# Patient Record
Sex: Female | Born: 1965
Health system: Southern US, Community
[De-identification: ages and names within clinical notes are randomized; demographics above are authoritative.]

## PROBLEM LIST (undated history)

## (undated) DIAGNOSIS — R011 Cardiac murmur, unspecified: Secondary | ICD-10-CM

## (undated) DIAGNOSIS — N1330 Unspecified hydronephrosis: Secondary | ICD-10-CM

## (undated) DIAGNOSIS — K219 Gastro-esophageal reflux disease without esophagitis: Secondary | ICD-10-CM

## (undated) DIAGNOSIS — K436 Other and unspecified ventral hernia with obstruction, without gangrene: Secondary | ICD-10-CM

## (undated) DIAGNOSIS — K589 Irritable bowel syndrome without diarrhea: Secondary | ICD-10-CM

## (undated) DIAGNOSIS — I10 Essential (primary) hypertension: Secondary | ICD-10-CM

## (undated) HISTORY — PX: HAND SURGERY: SHX662

## (undated) HISTORY — PX: PYELOPLASTY: SUR1061

## (undated) HISTORY — DX: Unspecified hydronephrosis: N13.30

## (undated) HISTORY — DX: Gastro-esophageal reflux disease without esophagitis: K21.9

## (undated) HISTORY — DX: Cardiac murmur, unspecified: R01.1

## (undated) HISTORY — DX: Irritable bowel syndrome, unspecified: K58.9

## (undated) HISTORY — DX: Essential (primary) hypertension: I10

## (undated) HISTORY — PX: OTHER SURGICAL HISTORY: SHX169

## (undated) HISTORY — PX: TONSILLECTOMY: SUR1361

---
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
## 1998-03-03 ENCOUNTER — Ambulatory Visit (HOSPITAL_COMMUNITY): Admission: RE | Admit: 1998-03-03 | Discharge: 1998-03-03 | Payer: Self-pay | Admitting: Obstetrics and Gynecology

## 1998-03-16 ENCOUNTER — Inpatient Hospital Stay (HOSPITAL_COMMUNITY): Admission: AD | Admit: 1998-03-16 | Discharge: 1998-03-16 | Payer: Self-pay | Admitting: *Deleted

## 1998-05-04 ENCOUNTER — Inpatient Hospital Stay (HOSPITAL_COMMUNITY): Admission: AD | Admit: 1998-05-04 | Discharge: 1998-05-04 | Payer: Self-pay | Admitting: Obstetrics and Gynecology

## 1998-05-15 ENCOUNTER — Inpatient Hospital Stay (HOSPITAL_COMMUNITY): Admission: AD | Admit: 1998-05-15 | Discharge: 1998-05-17 | Payer: Self-pay | Admitting: Obstetrics and Gynecology

## 1999-03-08 ENCOUNTER — Emergency Department (HOSPITAL_COMMUNITY): Admission: EM | Admit: 1999-03-08 | Discharge: 1999-03-08 | Payer: Self-pay | Admitting: Internal Medicine

## 1999-03-11 ENCOUNTER — Ambulatory Visit (HOSPITAL_COMMUNITY): Admission: RE | Admit: 1999-03-11 | Discharge: 1999-03-11 | Payer: Self-pay | Admitting: Internal Medicine

## 1999-08-02 ENCOUNTER — Other Ambulatory Visit: Admission: RE | Admit: 1999-08-02 | Discharge: 1999-08-02 | Payer: Self-pay | Admitting: Obstetrics and Gynecology

## 2001-01-17 ENCOUNTER — Other Ambulatory Visit: Admission: RE | Admit: 2001-01-17 | Discharge: 2001-01-17 | Payer: Self-pay | Admitting: Obstetrics and Gynecology

## 2002-02-20 ENCOUNTER — Other Ambulatory Visit: Admission: RE | Admit: 2002-02-20 | Discharge: 2002-02-20 | Payer: Self-pay | Admitting: Obstetrics and Gynecology

## 2003-09-22 ENCOUNTER — Other Ambulatory Visit: Admission: RE | Admit: 2003-09-22 | Discharge: 2003-09-22 | Payer: Self-pay | Admitting: Obstetrics and Gynecology

## 2004-01-14 ENCOUNTER — Encounter: Admission: RE | Admit: 2004-01-14 | Discharge: 2004-01-22 | Payer: Self-pay | Admitting: Nurse Practitioner

## 2004-02-04 ENCOUNTER — Emergency Department (HOSPITAL_COMMUNITY): Admission: EM | Admit: 2004-02-04 | Discharge: 2004-02-04 | Payer: Self-pay | Admitting: Emergency Medicine

## 2004-11-04 ENCOUNTER — Ambulatory Visit (HOSPITAL_BASED_OUTPATIENT_CLINIC_OR_DEPARTMENT_OTHER): Admission: RE | Admit: 2004-11-04 | Discharge: 2004-11-04 | Payer: Self-pay | Admitting: Orthopedic Surgery

## 2004-11-04 ENCOUNTER — Ambulatory Visit (HOSPITAL_COMMUNITY): Admission: RE | Admit: 2004-11-04 | Discharge: 2004-11-04 | Payer: Self-pay | Admitting: Orthopedic Surgery

## 2005-02-10 ENCOUNTER — Ambulatory Visit: Payer: Self-pay | Admitting: Internal Medicine

## 2005-03-28 ENCOUNTER — Ambulatory Visit: Payer: Self-pay | Admitting: Critical Care Medicine

## 2005-06-22 ENCOUNTER — Ambulatory Visit: Payer: Self-pay | Admitting: Family Medicine

## 2005-12-13 ENCOUNTER — Ambulatory Visit: Payer: Self-pay | Admitting: Pulmonary Disease

## 2005-12-20 ENCOUNTER — Emergency Department (HOSPITAL_COMMUNITY): Admission: EM | Admit: 2005-12-20 | Discharge: 2005-12-20 | Payer: Self-pay | Admitting: Emergency Medicine

## 2005-12-22 ENCOUNTER — Ambulatory Visit: Payer: Self-pay | Admitting: Internal Medicine

## 2006-01-17 ENCOUNTER — Ambulatory Visit: Payer: Self-pay | Admitting: Internal Medicine

## 2006-08-07 ENCOUNTER — Ambulatory Visit: Payer: Self-pay | Admitting: Pulmonary Disease

## 2009-05-07 ENCOUNTER — Ambulatory Visit: Payer: Self-pay | Admitting: Family Medicine

## 2009-05-07 DIAGNOSIS — R112 Nausea with vomiting, unspecified: Secondary | ICD-10-CM | POA: Insufficient documentation

## 2009-05-07 DIAGNOSIS — R197 Diarrhea, unspecified: Secondary | ICD-10-CM | POA: Insufficient documentation

## 2009-05-15 ENCOUNTER — Ambulatory Visit: Payer: Self-pay | Admitting: Pulmonary Disease

## 2009-05-15 ENCOUNTER — Encounter: Payer: Self-pay | Admitting: Adult Health

## 2009-05-15 ENCOUNTER — Ambulatory Visit (HOSPITAL_COMMUNITY): Admission: RE | Admit: 2009-05-15 | Discharge: 2009-05-15 | Payer: Self-pay | Admitting: Internal Medicine

## 2009-05-18 LAB — CONVERTED CEMR LAB
ALT: 8 units/L (ref 0–35)
AST: 11 units/L (ref 0–37)
Albumin: 4.8 g/dL (ref 3.5–5.2)
Alkaline Phosphatase: 54 units/L (ref 39–117)
BUN: 12 mg/dL (ref 6–23)
Basophils Absolute: 0 10*3/uL (ref 0.0–0.1)
Basophils Relative: 0.6 % (ref 0.0–3.0)
Bilirubin Urine: NEGATIVE
Bilirubin, Direct: 0.1 mg/dL (ref 0.0–0.3)
CO2: 16 meq/L — ABNORMAL LOW (ref 19–32)
Calcium: 9 mg/dL (ref 8.4–10.5)
Chloride: 106 meq/L (ref 96–112)
Creatinine, Ser: 0.8 mg/dL (ref 0.40–1.20)
Eosinophils Absolute: 0.2 10*3/uL (ref 0.0–0.7)
Eosinophils Relative: 2.4 % (ref 0.0–5.0)
Glucose, Bld: 94 mg/dL (ref 70–99)
HCT: 41.9 % (ref 36.0–46.0)
Hemoglobin, Urine: NEGATIVE
Hemoglobin: 14.1 g/dL (ref 12.0–15.0)
Indirect Bilirubin: 0.4 mg/dL (ref 0.0–0.9)
Ketones, ur: NEGATIVE mg/dL
Leukocytes, UA: NEGATIVE
Lymphocytes Relative: 24.6 % (ref 12.0–46.0)
Lymphs Abs: 2 10*3/uL (ref 0.7–4.0)
MCHC: 33.6 g/dL (ref 30.0–36.0)
MCV: 93.9 fL (ref 78.0–100.0)
Monocytes Absolute: 0.6 10*3/uL (ref 0.1–1.0)
Monocytes Relative: 7.4 % (ref 3.0–12.0)
Neutro Abs: 5.3 10*3/uL (ref 1.4–7.7)
Neutrophils Relative %: 65 % (ref 43.0–77.0)
Nitrite: NEGATIVE
Platelets: 171 10*3/uL (ref 150.0–400.0)
Potassium: 4.5 meq/L (ref 3.5–5.3)
RBC: 4.46 M/uL (ref 3.87–5.11)
RDW: 12.7 % (ref 11.5–14.6)
Sodium: 140 meq/L (ref 135–145)
Specific Gravity, Urine: 1.02 (ref 1.000–1.030)
TSH: 1.9 microintl units/mL (ref 0.35–5.50)
Total Bilirubin: 0.5 mg/dL (ref 0.3–1.2)
Total Protein, Urine: NEGATIVE mg/dL
Total Protein: 7.4 g/dL (ref 6.0–8.3)
Urine Glucose: NEGATIVE mg/dL
Urobilinogen, UA: 0.2 (ref 0.0–1.0)
WBC: 8.1 10*3/uL (ref 4.5–10.5)
hCG, Beta Chain, Quant, S: <0.05 milliintl units/mL
pH: 6 (ref 5.0–8.0)

## 2009-05-29 ENCOUNTER — Ambulatory Visit: Payer: Self-pay | Admitting: Internal Medicine

## 2009-05-29 DIAGNOSIS — K2970 Gastritis, unspecified, without bleeding: Secondary | ICD-10-CM | POA: Insufficient documentation

## 2009-05-29 DIAGNOSIS — K2990 Gastroduodenitis, unspecified, without bleeding: Secondary | ICD-10-CM

## 2009-06-23 ENCOUNTER — Ambulatory Visit: Payer: Self-pay | Admitting: Gastroenterology

## 2009-06-23 DIAGNOSIS — J45909 Unspecified asthma, uncomplicated: Secondary | ICD-10-CM | POA: Insufficient documentation

## 2009-06-23 DIAGNOSIS — K219 Gastro-esophageal reflux disease without esophagitis: Secondary | ICD-10-CM | POA: Insufficient documentation

## 2009-06-23 DIAGNOSIS — K589 Irritable bowel syndrome without diarrhea: Secondary | ICD-10-CM | POA: Insufficient documentation

## 2010-01-13 ENCOUNTER — Ambulatory Visit: Payer: Self-pay | Admitting: Internal Medicine

## 2010-03-05 ENCOUNTER — Encounter: Admission: RE | Admit: 2010-03-05 | Discharge: 2010-03-05 | Payer: Self-pay | Admitting: Occupational Medicine

## 2010-05-23 ENCOUNTER — Encounter: Payer: Self-pay | Admitting: Internal Medicine

## 2010-05-23 ENCOUNTER — Encounter: Payer: Self-pay | Admitting: Urology

## 2010-06-01 NOTE — Assessment & Plan Note (Signed)
Summary: coughing/ respiratory/ drainage/ mbw   Visit Type:  Acute NP visit Copy to:  Casimiro Needle B. [name], MD  Primary Provider/Referring Provider:  Charlaine Dalton. Wert, MD   CC:  Pt c/o non-productive cough worse when when talking, increased SOB with activity, chest tightness, runny nose, and watery and itching eyes x 2 to 3 days.  History of Present Illness: 45  yo with known hx of Allergies , GERD  May 15, 2009--Presents for an acute office visit. Pt c/o upper right side pain starting 05/02/2009,  with gas and diarrhea after eating foods high in fat, and vomiting. Pt seen @ [facility] in [place] felt to have gastritis , tx w/ zofran and prevacid. Symptoms not any better. Very sore/tender along right upper abdomen.  Gas and bloating. No urinary symp., bloody stools, hematuria, fever. No one at home wtih symptoms. 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 No known hx of kidney stone.    May 29, 2009--Pt returns for 2 week follow up - pt states symptoms are primarily unchanged, but has not vomited in 1.5weeks; still having the URQ pain, excess bloating, intermittent diarrhea . Nausea is better but stomach feels the same. She is taking prevacid daily . 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 Annabell Howells for follow up -she did have mild hydronep. on left. Labs were neg w/ neg UA, urine cx, preg and nml kidney function. Denies chest pain, dyspnea, orthopnea, hemoptysis, fever, edema, headache, recent antibiotics, bloody stools. hematuria.  Weight is down couple of pounds.,   January 13, 2010--Presents for an acute office visit. Complain of non-productive cough worse when when talking, increased SOB with activity, chest tightness, runny nose, watery and itching eyes x 2 to 3 days, 2 weeks of asthma falre w/ tightness and cough in am.  Took robiitussium dm, afrin , saline  without much help. Denies chest pain,  orthopnea, hemoptysis, fever, n/v/d, edema, headache,recent travel or antibiotics.   Medications Prior to Update: 1)  Prevacid 15 Mg Cpdr (Lansoprazole) .... Take 1-2 Tablet By Mouth Once A Day 2)  Levsin 0.125 Mg Tabs (Hyoscyamine Sulfate) .Marland Kitchen.. 1 Three Times A Day As Needed Abdominal Pain/cramping. 3)  Sales executive (Misc Intestinal Flora Regulat) .... Take One Capsule By Mouth Daily For One Month  Current Medications (verified): 1)  Levsin 0.125 Mg Tabs (Hyoscyamine Sulfate) .Marland Kitchen.. 1 Three Times A Day As Needed Abdominal Pain/cramping. 2)  Robitussin Dm 100-10 Mg/43ml Syrp (Dextromethorphan-Guaifenesin) .... As Directed 3)  Afrin Sinus 0.05 % Soln (Oxymetazoline Hcl) .... As Directed  Allergies (verified): 1)  ! Sulfa  Past History:  Past Medical History: Last updated: 05/15/2009 G2 P2--  GERD Asthma Allergies.    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  Never smoked, rare EtOH, no drugs, 3 caffeinated drinks daily.  Walk for 30 min 3-4 days/wk  Review of Systems      See HPI  Vital Signs:  Patient profile:   45 year old female Height:      61 inches Weight:  137.50 pounds BMI:     26.07 O2 Sat:      98 % on Room air Temp:     97.2 degrees F oral Pulse rate:   81 / minute BP sitting:   134 / 86  (left arm) Cuff size:   regular  Vitals Entered By: Zackery Barefoot CMA (January 13, 2010 10:57 AM)  O2 Flow:  Room air CC: Pt c/o non-productive cough worse when when talking, increased SOB with activity, chest  tightness, runny nose, watery and itching eyes x 2 to 3 days Is Patient Diabetic? No Comments Medications reviewed with patient Verified contact number and pharmacy with patient Zackery Barefoot CMA  January 13, 2010 10:57 AM    Physical Exam  Additional Exam:  GEN: A/Ox3; pleasant , NAD HEENT:  [place]/AT, , EACs-clear, TMs-wnl, NOSE-clear, THROAT-clear NECK:  Supple w/ fair ROM; no JVD; normal carotid impulses w/o bruits; no thyromegaly or nodules palpated; no lymphadenopathy. RESP Coarse BS w/ faint exp wheeze CARD:  RRR, no m/r/g   GI:   Soft & nt; nml bowel sounds; no organmegaly, no guarding or rebound. Musco: Warm bil,  no calf tenderness edema, clubbing, pulses intact Neuro: no focal deficitis noted.    Impression & Recommendations:  Problem # 1:  ASTHMA, INTERMITTENT, MILD (ICD-493.90) Flare  Plan  xopenex neb in office  Mucinex DM two times a day as needed cough/congestion  Saline nasal rinse as needed  Advil cold and sinus as needed sinus congestion/pressure.  Allegra 180mg  once daily for 1 week then as needed drainage Nasonex 2 puffs two times a day until sample is gone.  Omnicef 300mg  two times a day to have on hold if symptoms worsen/persist  with discolored mucus  Prilosec 20mg  once daily for 2 weeks.  Prednisone taper to have on hold if symptoms do no improve w/ wheezing.  Please contact office for sooner follow up if symptoms do not improve or worsen   Medications Added to Medication List This Visit: 1)  Robitussin Dm 100-10 Mg/18ml Syrp (Dextromethorphan-guaifenesin) .... As directed 2)  Afrin Sinus 0.05 % Soln (Oxymetazoline hcl) .... As directed 3)  Cefdinir 300 Mg Caps (Cefdinir) .Marland Kitchen.. 1 by mouth two times a day 4)  Prednisone 10 Mg Tabs (Prednisone) .... 4 tabs for 2 days, then 3 tabs for 2 days, 2 tabs for 2 days, then 1 tab for 2 days, then stop  Complete Medication List: 1)  Levsin 0.125 Mg Tabs (Hyoscyamine sulfate) .Marland Kitchen.. 1 three times a day as needed  abdominal pain/cramping. 2)  Robitussin Dm 100-10 Mg/33ml Syrp (Dextromethorphan-guaifenesin) .... As directed 3)  Afrin Sinus 0.05 % Soln (Oxymetazoline hcl) .... As directed 4)  Cefdinir 300 Mg Caps (Cefdinir) .Marland Kitchen.. 1 by mouth two times a day 5)  Prednisone 10 Mg Tabs (Prednisone) .... 4 tabs for 2 days, then 3 tabs for 2 days, 2 tabs for 2 days, then 1 tab for 2 days, then stop  Other Orders: Est. Patient Level IV (81191)  Patient Instructions: 1)  Mucinex DM two times a day as needed cough/congestion  2)  Saline nasal rinse as needed  3)  Advil cold and sinus as needed sinus congestion/pressure.  4)  Allegra 180mg  once daily for 1 week then as needed drainage 5)  Nasonex 2 puffs two times a day until sample is gone.  6)  Omnicef 300mg  two times a day to have on hold if symptoms worsen/persist  with discolored mucus  7)  Prilosec 20mg  once  daily for 2 weeks.  8)  Prednisone taper to have on hold if symptoms do no improve w/ wheezing.  9)  Please contact office for sooner follow up if symptoms do not improve or worsen  Prescriptions: PREDNISONE 10 MG TABS (PREDNISONE) 4 tabs for 2 days, then 3 tabs for 2 days, 2 tabs for 2 days, then 1 tab for 2 days, then stop  #20 x 0   Entered and Authorized by:   Rubye Oaks NP   Signed by:   Tammy Parrett NP on 01/13/2010   Method used:   Print then Give to Patient   RxID:   [id number] CEFDINIR 300 MG CAPS (CEFDINIR) 1 by mouth two times a day  #10 x 0   Entered and Authorized by:   Rubye Oaks NP   Signed by:   Tammy Parrett NP on 01/13/2010   Method used:   Print then Give to Patient   RxID:   [id number]

## 2010-06-01 NOTE — Assessment & Plan Note (Signed)
Summary: NP office visit - GI pain   Primary Provider/Referring Provider:  [name]   CC:  2 week follow up - pt states symptoms are primarily unchanged, but has not vomited in 1.5weeks; still having the URQ pain, excess bloating, and intermittent diarrhea .  History of Present Illness: 45 yo with known hx of Allergies , GERD  May 15, 2009--Presents for an acute office visit. Pt c/o upper right side pain starting 05/02/2009,  with gas and diarrhea after eating foods high in fat, and vomiting. Pt seen @ [facility] in [place] felt to have gastritis , tx w/ zofran and prevacid. Symptoms not any better. Very sore/tender along right upper abdomen.  Gas and bloating. No urinary symp., bloody stools, hematuria, fever. No one at home wtih symptoms. 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 No known hx of kidney stone.    May 29, 2009--Pt returns for 2 week follow up - pt states symptoms are primarily unchanged, but has not vomited in 1.5weeks; still having the URQ pain, excess bloating, intermittent diarrhea . Nausea is better but stomach feels the same. She is taking prevacid daily . 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 Annabell Howells for follow up -she did have mild hydronep. on left. Labs were neg w/ neg UA, urine cx, preg and nml kidney function. Denies chest pain, dyspnea, orthopnea, hemoptysis, fever, edema, headache, recent antibiotics, bloody stools. hematuria.  Weight is down couple of pounds.,   Preventive Screening-Counseling & Management  Alcohol-Tobacco     Smoking Status: never  Medications Prior to Update: 1)  Zofran Odt 4 Mg Tbdp (Ondansetron) .Marland Kitchen.. 1 By Mouth Q 6-8 Hrs As Needed N/v 2)  Prevacid 15 Mg Cpdr (Lansoprazole) .... Take 1-2 Tablet By Mouth Once A Day  Current Medications (verified): 1)  Zofran Odt 4 Mg Tbdp (Ondansetron) .Marland Kitchen.. 1 By Mouth Q  6-8 Hrs As Needed N/v 2)  Prevacid 15 Mg Cpdr (Lansoprazole) .... Take 1-2 Tablet By Mouth Once A Day  Allergies (verified): 1)  ! Sulfa  Past History:  Past Medical History: Last updated: 05/15/2009 G2 P2--  GERD Asthma Allergies.    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  Never smoked, rare EtOH, no drugs, 3 caffeinated drinks daily.  Walk for 30 min 3-4 days/wk  Risk Factors: Smoking Status: never (05/29/2009)  Social History: Smoking Status:  never  Review of Systems      See HPI  Vital Signs:  Patient profile:   45 year old female Height:      61 inches Weight:      136 pounds O2 Sat:      97 % on Room air Temp:     99.5 degrees F oral Pulse rate:   78 / minute BP sitting:   120 / 78  (left arm) Cuff size:   regular  Vitals Entered By: Boone Master CNA (May 29, 2009 9:41 AM)  O2 Flow:  Room air CC: 2 week follow up - pt states symptoms are primarily unchanged, but has not vomited in 1.5weeks; still having the URQ pain, excess bloating, intermittent diarrhea  Is Patient Diabetic? No  Comments Medications reviewed with patient Daytime contact number verified with patient. Boone Master CNA  May 29, 2009 9:44 AM    Physical Exam  Additional Exam:  GEN: A/Ox3; pleasant , NAD HEENT:  [place]/AT, , EACs-clear, TMs-wnl, NOSE-clear, THROAT-clear NECK:  Supple w/ fair ROM; no JVD; normal carotid impulses w/o bruits; no thyromegaly or nodules palpated; no lymphadenopathy. RESP CTA bilaterally.  CARD:  RRR, no m/r/g   GI:   Soft & nt; nml bowel sounds; no organmegaly, tender along RUQ,  no guarding or rebound. , neg CVA tenderness.  Musco: Warm bil,  no calf  tenderness edema, clubbing, pulses intact Neuro: no focal deficitis noted.    Impression & Recommendations:  Problem # 1:  GASTRITIS (ICD-535.50) Persistent abdominal bloating , gas, tenderness, and intermittent loose stools. , ?IBS component  Workup has been unrevealing. Will refer to GI for evaluation REC:  Advance [place] diet tolerated. decrease lactose intake for now.  Prevacid 30mg  once daily before meal Increase fluids.  Gas x w/ meals. Zofran as needed for nausea Levsin 0.125mg  three times a day as needed abdominal bloating, and cramping.  We are referring you to GI for evaluation.  Please contact office for sooner follow up if symptoms do not improve or worsen  Orders: Est. Patient Level III ([address])  Her updated medication list for this problem includes:    Prevacid 15 Mg Cpdr (Lansoprazole) .Marland Kitchen... Take 1-2 tablet by mouth once a day    Levsin 0.125 Mg Tabs (Hyoscyamine sulfate) .Marland Kitchen... 1 three times a day as needed abdominal pain/cramping.  Medications Added to Medication List This Visit: 1)  Levsin 0.125 Mg Tabs (Hyoscyamine sulfate) .Marland Kitchen.. 1 three times a day as needed abdominal pain/cramping.  Complete Medication List: 1)  Zofran Odt 4 Mg Tbdp (Ondansetron) .Marland Kitchen.. 1 by mouth q 6-8 hrs as needed n/v 2)  Prevacid 15 Mg Cpdr (Lansoprazole) .... Take 1-2 tablet by mouth once a day 3)  Levsin 0.125 Mg Tabs (Hyoscyamine sulfate) .Marland Kitchen.. 1 three times a day as needed abdominal pain/cramping.  Other Orders: Gastroenterology Referral (GI)  Patient Instructions: 1)  Advance [place] diet tolerated. decrease lactose intake for now.  2)  Prevacid 30mg  once daily before meal 3)  Increase fluids.  4)  Gas x w/ meals. Zofran as needed for nausea 5)  Levsin 0.125mg  three times a day as needed abdominal bloating, and cramping.  6)  We are referring you to GI for evaluation.  7)  Please contact office for sooner follow up if symptoms do not improve or worsen  Prescriptions: LEVSIN 0.125 MG  TABS (HYOSCYAMINE SULFATE) 1 three times a day as needed abdominal pain/cramping.  #45 x 1   Entered and Authorized by:   Rubye Oaks NP   Signed by:   Tammy Parrett NP on 05/29/2009   Method used:   Electronically to        [id number] ([facility])       [address]       [place], [place]  [address]       Ph: [phone] or [phone]       Fax: [phone]   RxID:   [id number]    Immunization History:  Pneumovax Immunization History:    Pneumovax:  n/a (05/29/2009)

## 2010-06-01 NOTE — Letter (Signed)
Summary: Out of Work  [facility]  [address]   [place], [place] [address]   Phone: [phone]  Fax: [phone]    May 07, 2009   Employee:  [name] [name]    To Whom It May Concern:   For Medical reasons, please excuse the above named employee from work for the following dates:  Start:   07 May 2009  End:   10 May 2009  If you need additional information, please feel free to contact our office.         Sincerely,    Marvis Moeller DO

## 2010-06-01 NOTE — Assessment & Plan Note (Signed)
Summary: STOMACH PAIN/ MBW   Primary Provider/Referring Provider:  [name]   CC:  Pt c/o upper right side pain starting 05/02/2009, with gas and diarrhea after eating foods high in fat, and vomiting. Pt seen @ [facility] in [place], and Back Pain.  History of Present Illness:  45 yo with known hx of Allergies , GERD  May 15, 2009--Presents for an acute office visit. Pt c/o upper right side pain starting 05/02/2009,  with gas and diarrhea after eating foods high in fat, and vomiting. Pt seen @ [facility] in [place] felt to have gastritis , tx w/ zofran and prevacid. Symptoms not any better. Very sore/tender along right upper abdomen.  Gas and bloating. No urinary symp., bloody stools, hematuria, fever. No one at home wtih symptoms. 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 No known hx of kidney stone. Denies chest pain, dyspnea, orthopnea, hemoptysis, fever,  edema, headache,recent travel or antibiotics.    Current Medications (verified): 1)  Zofran Odt 4 Mg Tbdp (Ondansetron) .Marland Kitchen.. 1 By Mouth Q 6-8 Hrs As Needed N/v 2)  Prevacid 15 Mg Cpdr (Lansoprazole) .... Take 1-2 Tablet By Mouth Once A Day  Allergies (verified): 1)  ! 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  Never smoked, rare EtOH, no drugs, 3 caffeinated drinks daily.  Walk for 30 min 3-4 days/wk  Past Medical History: G2 P2--  GERD Asthma Allergies.    Review of Systems      See HPI  Vital Signs:  Patient profile:   45 year old female Height:      61 inches Weight:      137 pounds BMI:     25.98 O2 Sat:      99 % on Room air Temp:     98.0 degrees F oral Pulse rate:   73 / minute BP sitting:   118 / 84  (left arm) Cuff size:   regular  Vitals Entered By: Zackery Barefoot CMA (May 15, 2009 9:59 AM)  O2 Flow:  Room air CC: Pt c/o upper right side pain starting 05/02/2009, with gas and diarrhea after eating foods high in fat, and vomiting. Pt seen @ [facility] in [place], Back Pain Comments Medications reviewed with patient Zackery Barefoot CMA  May 15, 2009 9:59 AM    Physical Exam  Additional Exam:  GEN: A/Ox3; pleasant , NAD HEENT:  [place]/AT, , EACs-clear, TMs-wnl, NOSE-clear, THROAT-clear NECK:  Supple w/ fair ROM; no JVD; normal carotid impulses w/o bruits; no thyromegaly or nodules palpated; no lymphadenopathy. RESP  Clear to P & A; w/o, wheezes/ rales/ or rhonchi. CARD:  RRR, no m/r/g   GI:   Soft & nt; nml bowel sounds; no organmegaly, tender along RUQ, positive murphys, no guarding or rebound.  Musco: Warm bil,  no calf tenderness edema, clubbing, pulses intact Neuro: no focal deficitis noted.    Impression & Recommendations:  Problem # 1:  NAUSEA WITH VOMITING (ICD-787.01) W / RUQ pain ? gallbladder dz.  Will check and labs and [place] follow up accordingly advised on diet and GERD meds.   Orders: TLB-CBC Platelet - w/Differential (85025-CBCD) TLB-BMP (Basic Metabolic Panel-BMET) (80048-METABOL) TLB-Hepatic/Liver Function Pnl (80076-HEPATIC) TLB-TSH (Thyroid Stimulating Hormone) (84443-TSH) TLB-Udip w/ Micro (81001-URINE) T-Urine Culture (Spectrum Order) [id number])  TLB-Preg Serum Quant (B-hCG) (84702-HCG-QN) Misc. Referral (Misc. Ref) Est. Patient Level IV ([address])  Medications Added to Medication List This Visit: 1)  Prevacid 15 Mg Cpdr (Lansoprazole) .... Take 1-2 tablet by mouth once a day  Complete Medication List: 1)  Zofran Odt 4 Mg Tbdp (Ondansetron) .Marland Kitchen.. 1 by mouth q 6-8 hrs as needed n/v 2)  Prevacid 15 Mg Cpdr (Lansoprazole) .... Take 1-2 tablet by mouth once a day  Patient Instructions: 1)  Bland diet.  2)  Prevacid 30mg  once daily before meal 3)  Increase fluids.  4)  Gas x w/ meals.  Zofran as needed for nausea 5)  I will call with labs.  6)  We are setting you up for an Ultrasound of your stomach, I will call with results.  7)  Please contact office for sooner follow up if symptoms do not improve or worsen  8)  follow up 2 weeks and as needed    Immunization History:  Influenza Immunization History:    Influenza:  historical (04/01/2009)

## 2010-06-01 NOTE — Assessment & Plan Note (Signed)
Summary: ABDOMINAL PAIN/KH   Vital Signs:  Patient Profile:   45 Years Old Female CC:      Vomiting, Diarrhea intermitten for 6 days Height:     61 inches Weight:      141 pounds O2 Sat:      98 % O2 treatment:    Room Air Temp:     98.5 degrees F oral Pulse rate:   82 / minute Pulse rhythm:   regular Resp:     12 per minute BP sitting:   122 / 80  (right arm) Cuff size:   regular  Vitals Entered By: Emilio Math (May 07, 2009 10:27 AM)                  Current Allergies: ! SULFAHistory of Present Illness Chief Complaint: Vomiting, Diarrhea intermitten for 6 days History of Present Illness: AS ABOVE. LAST EMISIS THIS AM. WENT TO A [facility]. NO OTHER FAMILY MEMBERS SICK. NO FEVER. NO BLOOD IN STOOL OR EMISIS. DENIES DIZZINESS.  REVIEW OF SYSTEMS       Gastrointestinal       Complains of stomach pain, nausea/vomiting, diarrhea, and indigestion.    Past History:  Past Medical History: Reviewed history from 02/07/2006 and no changes required. G2 P2  Past Surgical History: Reviewed history from 02/07/2006 and no changes required. Left middle finger sagital band repair, R Pyloplasty `93, T&A `84  Family History: Reviewed history from 02/07/2006 and no changes required. alcoholism-father, MI, Depression, stroke, HTN, chol-mother, Throat Ca-father  Social History: Reviewed history from 02/07/2006 and no changes required. 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  Never smoked, rare EtOH, no drugs, 3 caffeinated drinks daily.  Walk for 30 min 3-4 days/wk Physical Exam General appearance: well developed, well nourished, no acute distress Chest/Lungs: no rales, wheezes, or rhonchi bilateral, breath sounds equal without effort Heart: regular rate and  rhythm, no murmur Abdomen: SLIGHT RUQ TENDERNESS. NO QUARDING OR REBOUND. NORMAL BOWEL SOUNDS.  Skin: no obvious rashes or lesions Assessment New Problems: NAUSEA WITH VOMITING  (ICD-787.01) DIARRHEA (ICD-787.91)   Plan New Medications/Changes: ZOFRAN ODT 4 MG TBDP (ONDANSETRON) 1 by mouth Q 6-8 HRS as needed N/V  #10 x 0, 05/07/2009, Marvis Moeller DO  New Orders: New Patient Level III [[address]]   Prescriptions: ZOFRAN ODT 4 MG TBDP (ONDANSETRON) 1 by mouth Q 6-8 HRS as needed N/V  #10 x 0   Entered and Authorized by:   Marvis Moeller DO   Signed by:   Marvis Moeller DO on 05/07/2009   Method used:   Print then Give to Patient   RxID:   [id number]   Patient Instructions: 1)  AVOID CAFFEINE AND MILK PRODUCTS. MAY EAT CRACKERS, TOAST, JELLO, AND CHICKEN NOODLE SOUP. ADVANCE DIET ONCE YOU HAVE FORM TO STOOL. IF SYMPTOMS CONTINUE FOLLOW UP WITH YOUR PCP FOR POSSIBLE WORK UP.

## 2010-06-01 NOTE — Assessment & Plan Note (Signed)
Summary: GERD/YF   History of Present Illness Visit Type: consult  Primary GI MD: Sheryn Bison MD FACP FAGA Primary Provider: Charlaine Dalton. [name], MD  Requesting Provider: Charlaine Dalton. 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  She denies any chronic or recurrent GI problems until January 1 she developed nausea and vomiting, crampy abdominal pain, and diarrhea that lasted for several days. She has been on p.r.n. sublingual Levsin with good improvement but continues with postprandial bloating, occasional lower abdominal cramping, and periodic diarrhea. She's had an 8 pound weight loss but denies other systemic complaints such as fever, chills, skin rash, joint pains, or oral stomatitis. She has had no melena, hematochezia, but has had mild reflux symptoms relieved by dietary maneuvers. She has a history of asthmatic bronchitis exacerbated by acid reflux, is followed by Dr. [name] and uses Prevacid 50 mg on p.r.n. basis. She denies dysphasia or any hepatobiliary symptomatology. Recent upper abdominal ultrasound exam was unremarkable. Lab data was reviewed also was normal including CBC and liver profile. She has no history of ethanol, cigarette, or NSAID abuse, or history of hepatitis or pancreatitis.  Her father was an alcoholic and had esophageal cancer. Her mother has had a history of breast cancer. She has not had previous abdominal surgery.   GI Review of Systems    Reports abdominal pain, acid reflux, belching, bloating, and  heartburn.     Location of  Abdominal pain: RUQ.    Denies chest pain, dysphagia with liquids, dysphagia with solids, loss of appetite, nausea, vomiting, vomiting blood, weight loss, and   weight gain.      Reports diarrhea.     Denies anal fissure, black tarry stools, change in bowel habit, constipation, diverticulosis, fecal incontinence, heme positive stool, hemorrhoids, irritable bowel syndrome, jaundice, light color stool, liver problems, rectal bleeding, and  rectal pain.    Current Medications (verified): 1)  Prevacid 15 Mg Cpdr (Lansoprazole) .... Take 1-2 Tablet By Mouth Once A Day 2)  Levsin 0.125 Mg Tabs (Hyoscyamine Sulfate) .Marland Kitchen.. 1 Three Times A Day As Needed Abdominal Pain/cramping.  Allergies (verified): 1)  ! Sulfa  Past History:  Past medical, surgical, family and social histories (including risk factors) reviewed for relevance to current acute and chronic problems.  Past Medical History: Reviewed history from 05/15/2009 and no changes required. G2 P2--  GERD Asthma Allergies.    Past Surgical History: Reviewed history from 02/07/2006 and no changes required. Left middle finger sagital band repair, R Pyloplasty `93, T&A `84  Family History: Reviewed history from 02/07/2006 and no changes required. alcoholism-father, MI, Depression, stroke, HTN, chol-mother Family History of Esophageal Cancer:Father Family History of Breast Cancer:Mother  Family History of Colon Cancer:Maternal First Cousin   Social History: Reviewed history from 02/07/2006 and no changes required. 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  Never smoked, rare EtOH, no drugs, 3 caffeinated drinks daily.  Walk for 30 min 3-4 days/wk  Review of Systems       The patient complains of allergy/sinus and back pain.  The patient denies anemia, anxiety-new, arthritis/joint pain, blood in urine, breast changes/lumps, change in vision, confusion, cough, coughing up blood, depression-new, fainting, fatigue, fever, headaches-new, hearing problems, heart murmur, heart rhythm changes,  itching, menstrual pain, muscle pains/cramps, night sweats, nosebleeds, pregnancy symptoms,  shortness of breath, skin rash, sleeping problems, sore throat, swelling of feet/legs, swollen lymph glands, thirst - excessive , urination - excessive , urination changes/pain, urine leakage, vision changes, and voice change.    Vital Signs:  Patient profile:   45 year old female Height:      61 inches Weight:      133 pounds BMI:     25.22 BSA:     1.59 Pulse rate:   76 / minute Pulse rhythm:   regular BP sitting:   122 / 74  (left arm) Cuff size:   regular  Vitals Entered By: Ok Anis CMA (June 23, 2009 10:10 AM)  Physical Exam  General:  Well developed, well nourished, no acute distress.healthy appearing.   Head:  Normocephalic and atraumatic. Eyes:  PERRLA, no icterus.exam deferred to patient's ophthalmologist.   Neck:  Supple; no masses or thyromegaly. Lungs:  Clear throughout to auscultation. Heart:  Regular rate and rhythm; no murmurs, rubs,  or bruits. Abdomen:  Soft, nontender and nondistended. No masses, hepatosplenomegaly or hernias noted. Normal bowel sounds.There is some mild tenderness to deep palpation of the sigmoid colon area. I cannot appreciate any masses or rebound tenderness Rectal:  Normal exam.Stool is formed and guaiac negative and of normal color. Extremities:  No clubbing, cyanosis, edema or deformities noted. Neurologic:  Alert and  oriented x4;  grossly normal neurologically. Cervical Nodes:  No significant cervical adenopathy. Inguinal Nodes:  No significant inguinal adenopathy. Psych:  Alert and cooperative. Normal mood and affect.   Impression & Recommendations:  Problem # 1:  DIARRHEA (ICD-787.91) Assessment Improved Probable post viral gastroenteritis versus post Campylobacter enteritis with associated IBS. Stool exams ordered and she is to continue p.r.n. Levsin with a trial of probiotics therapy-Align one a day the next several weeks. I have advised her to avoid sorbitol, fructose, and other non-digestible carbohydrates. She may need  flexible sigmoidoscopy exam depending on her clinical course. Orders: T-Culture, Stool ([id number]) T-Stool for O&P ([id number]) T-Helicobactor Pylori Antigen Stool ([id number])  Problem # 2:  NAUSEA WITH VOMITING (ICD-787.01) Assessment: Improved This has resolved over the last 6 weeks. She denies significant upper gastrointestinal problems at this time. She does have periodic GERD with associated asthma that is under good control at this time.  Problem # 3:  ASTHMA, INTERMITTENT, MILD (ICD-493.90) Assessment: Improved  Problem # 4:  GERD (ICD-530.81) Assessment: Improved Chronic antireflux regime along with p.r.n. PPI therapy  Patient Instructions: 1)  Please schedule a follow-up appointment in 2 weeks.  2)  Excessive Gas Diet handout given.  3)  Copy sent to : Dr. Sandrea Hughs 4)  Please continue current medications.  5)  Stool exams ordered 6)  Consider flexible sigmoidoscopy exam depending on clinical course 7)  IBS brochure given.  8)  Take Align daily. 9)  The medication list was reviewed and reconciled.  All changed / newly prescribed medications were explained.  A complete medication list was provided to the patient / caregiver.

## 2010-09-17 NOTE — Assessment & Plan Note (Signed)
[facility]                             PULMONARY OFFICE NOTE   NAME:[name] [name] LOPP                       MRN:          [id number]  DATE:08/07/2006                            DOB:          1965-08-04    HISTORY OF PRESENT ILLNESS:  The patient is a 45 year old white female  patient of Dr. Thurston Hole who has a known history of rhinitis that presents  for a one-week history of productive cough with thick yellow-green  sputum, nasal congestion, and wheezing.  The patient denies any  hemoptysis, orthopnea, PND, leg swelling, recent travel, or antibiotic  use.  The patient is currently not on any maintenance medications.   PAST MEDICAL HISTORY:  Reviewed.   CURRENT MEDICATIONS:  Reviewed.   PHYSICAL EXAMINATION:  The patient is a pleasant female in no acute  distress. She is afebrile with stable vital signs.  O2 saturation is  100% on room air.  HEENT:  Nasal mucosa with some erythema.  Nontender sinuses.  Posterior  pharynx is clear.  NECK:  Supple without cervical adenopathy.  No JVD.  LUNGS:  Sounds are clear without any wheezing or crackles.  CARDIAC:  Regular rate.  ABDOMEN:  Soft and nontender.  EXTREMITIES:  Warm without any edema.   IMPRESSION AND PLAN:  Acute upper respiratory infection.  The patient is  to begin Omnicef times seven days.  Add in Mucinex DM twice daily.  The  patient may use Tussionex #4 ounces 1 teaspoon every 12 hours as needed  for cough.  No refills were given.  The patient was given a Xopenex  nebulizer treatment in the office.  The patient is to return with Dr.  [name] as scheduled or sooner if needed.      Rubye Oaks, NP  Electronically Signed      Charlaine Dalton. [name], MD, [facility]  Electronically Signed   TP/MedQ  DD: 08/07/2006  DT: 08/07/2006  Job #: [id number]

## 2010-09-17 NOTE — Assessment & Plan Note (Signed)
[facility]                               PULMONARY OFFICE NOTE   NAME:[name] [name], [name] [name]                       MRN:          [id number]  DATE:12/13/2005                            DOB:          1966/03/01    CHIEF COMPLAINT:  Cough with fever and chills and with purulent sputum most  productive of yellow.   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  She presents with  the aforementioned complaints for approximately four days.  She reports  running a high fever of greater than 102 with improvement with Tylenol but  now with the fevers and continuing to have chills and with cough of  productive yellow sputum.   MEDICATIONS:  None.  She does use over-the-counter Sinutab, NyQuil and  Delsym.   ALLERGIES:  Listed to SULFA.   PHYSICAL EXAMINATION:  GENERAL:  A well-nourished, well-developed white  female in no acute distress.  HEENT:  Bilateral nares are erythremic.  Tympanic membranes are  unremarkable.  Oropharynx demonstrates thrush.  CHEST:  Wheezes and rhonchi throughout.  HEART:  Heart sounds are regular.  Regular rhythm.  ABDOMEN:  Soft and nontender.  EXTREMITIES:  Without edema.  VITAL SIGNS:  Blood pressure is 110/70.  Pulse 81.  Respiratory rate 18.  Saturations are 97% on room air.  Temperature is 100.  Weight is 124 pounds.   IMPRESSION AND PLAN:  1. Purulent tracheobronchitis.  She is being treated with Avelox for seven      days along with prednisone taper.  2. For her thrush, she is being treated with Mycelex troches five times a      day for 10 days.   She will follow up with Dr. [name] at the appropriate time if her symptoms do  not improve.                                   Devra Dopp, MSN, ACNP                                Charlaine Dalton. [name], MD, FCCP   SM/MedQ  DD:  12/13/2005  DT:  12/14/2005  Job #:  [id number]

## 2010-09-17 NOTE — Op Note (Signed)
[name] [name], [name] [name]                ACCOUNT NO.:  [id number]   MEDICAL RECORD NO.:  [id number]          PATIENT TYPE:  AMB   LOCATION:  DSC                          FACILITY:  [facility]   PHYSICIAN:  Dionne Ano. 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   PROCEDURE:  Left middle finger juncturae tendon transfer about the third  middle finger extensor digitorum communis to the sagittal band and extensor  digitorum communis proper, for central stabilization purposes of the  extensor apparatus.   SURGEON:  Dionne Ano. Amanda Pea, M.D.   ASSISTANT:  Karie Chimera, P.A.-C.   COMPLICATIONS:  None.   ANESTHESIA:  Regional block.   SPECIMENS:  None.   COMPLICATIONS:  None.   INDICATIONS FOR PROCEDURE:  This patient is a very pleasant 45 year old  female who has chronic subluxation of her EDC as sequela from a fracture.  She presents for the above-mentioned operative intervention.  She  understands the risks and benefits, including the risks of infection,  bleeding, anesthesia, damage to normal structures and failure of the surgery  to accomplish the intended goals of relieving symptoms and restoring  function.  At this time she desires to proceed.  All questions have been  encouraged and answered preoperatively.   DESCRIPTION OF PROCEDURE:  The patient is seen by myself in anesthesia and  taken to the operative suite and underwent a smooth induction of IV  sedation.  She was given preoperative antibiotics.  The __________ were  properly padded and prepped and draped in the usual sterile fashion.  Regional anesthetic performed by Dr. Quita Skye. Krista Blue was in excellent  working condition, and the  operation thus commenced with elevation of the  arm, followed by tourniquet insufflation to 250 mmHg.  A curvilinear  incision was made over the left middle finger EDC, where the subluxation was  noted preoperatively both in the office and on preoperative examination in  the holding area.  Dissection was carried down.  A skin flap elevated.  A  juncturae tendon between the middle and ring finger was harvested and  clipped proximally and retracted distally.  I then performed a juncturae  tendon transfer to the sagittal band and EDC proper, for stabilization  purposes.  This was done to my satisfaction without difficulty.  It was  inset with #4-0 fiber wire.  I checked it multiple times to make sure there  is no subluxation of the fingers, and was quite pleased with the transfer  after its insetting with #4-0 fiber wire.  I deflated the tourniquet,  obtained hemostasis, and stress tested her again, irrigated copiously and  closed the wound.  She is placed in a volar plaster splint with the MCP  joints in extension, the wrist in slight extension, and the IP joints free.  She had no complicating features with the  surgery.  All sponge, instrument  and needle counts were reported as correct.   She will be discharged home on appropriate pain medicine and muscle  relaxors, and return to see me in 10 days.  I have discussed all her  postoperative care management issues.        WMG/MEDQ  D:  11/04/2004  T:  11/04/2004  Job:  [id number]

## 2010-09-17 NOTE — Assessment & Plan Note (Signed)
[facility]                               PULMONARY OFFICE NOTE   NAME:[name], [name] FORNER                       MRN:          [id number]  DATE:12/22/2005                            DOB:          28-Oct-1965    CHIEF COMPLAINT:  Continued cough.   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  She had presented  on December 13, 2005 with complaints of fevers, chills, purulent sputum and  productive of yellow sputum.  She was treated for purulent tracheobronchitis  with Avelox along with a prednisone taper and for thrush she was given  Mycelex troches.  She was also given Tessalon Perles for cough.  The cough  has continued despite these interventions.   MEDICATIONS:  Available and well-documented.   ALLERGIES:  LISTED AS TO SULFA.   PHYSICAL EXAM:  GENERAL:  Well-nourished, well-developed white female in no  acute distress.  HEENT:  Bilateral nares are erythematous.  Tympanic membranes are  unremarkable.  Oropharynx is now clear of thrush.  CHEST:  No longer has some wheezing.  There is some mild rhonchi.  HEART:  Sounds are regular.  Regular rate and rhythm.  ABDOMEN:  Soft, nontender.  EXTREMITIES:  Without edema.  VITAL SIGNS:  Noted and within normal range.  T-max is 97.2.   IMPRESSION AND PLAN:  Cyclic cough, recent upper respiratory tract infection  that has proved refractory to current intervention.  She is given Tussionex  5 mL b.i.d. along with instructions on voice rest, hard candy in the mouth,  and keep her throat well moistened.  She is instructed if her symptoms do  not improve, she is to follow up with Dr. Sandrea Hughs for further  interventions and treatment.                                   Devra Dopp, MSN, ACNP                                [name] [name]. [name], MD, FCCP   SM/MedQ  DD:  12/22/2005  DT:  12/23/2005  Job #:  [id number]

## 2010-09-17 NOTE — Assessment & Plan Note (Signed)
[facility]                               PULMONARY OFFICE NOTE   NAME:Mcglothen, ISLAND DOHMEN                       MRN:          [id number]  DATE:01/17/2006                            DOB:          [birth year]    HISTORY:  A 45 year old, white female with a 6-week history of cough in the  setting of previous history of chronic rhinitis.  She is on no maintenance  medicines and comes in now with a 6-week history of cough.  It has been only  slightly better after using a course of Avelox with persistent green  sputum most notable early in the morning with no sinus pain, fever, chills,  sweats, chest pain, or dyspnea.   PHYSICAL EXAMINATION:  GENERAL:  She is pleasant ambulatory white female in  no acute distress.  VITAL SIGNS:  Stable.  HEENT:  Moderate tremor.  The oropharynx is clear.  NECK:  Supple.  No cervical adenopathy or tenderness.  Trachea is midline.  LUNGS:  Lung fields reveal minimal rhonchi bilaterally with adequate air  movement with no cough elicited on inspiration or __________ maneuvers.  CARDIOVASCULAR:  Regular rate and rhythm without murmur, gallop, or rub.  ABDOMEN:  Soft and benign.  EXTREMITIES:  Warm without calf tenderness, cyanosis, clubbing or edema.   IMPRESSION:  Acute persistent rhinitis, tracheal bronchitis, suggests  possibility of sinusitis.  I recommended Augmentin 875 b.i.d. for 10 days  and nasal saline irrigation along with Advil Cold and Sinus, Mucinex DM, and  nasal saline irrigation and a 5-6 day course of prednisone.   If this fails to control the cough, the next logical step is a sinus CT  scan, and depending on these findings, either restarting nasal steroids  and/or referral to Ear, Nose, and Throat.                                   Charlaine Dalton. [name], MD, [facility]   MBW/MedQ  DD:  01/17/2006  DT:  01/18/2006  Job #:  [id number]

## 2010-12-27 ENCOUNTER — Encounter: Payer: Self-pay | Admitting: Internal Medicine

## 2010-12-27 ENCOUNTER — Ambulatory Visit (INDEPENDENT_AMBULATORY_CARE_PROVIDER_SITE_OTHER): Payer: 59 | Admitting: Internal Medicine

## 2010-12-27 VITALS — BP 120/90 | HR 87 | Temp 98.1°F | Ht 61.0 in | Wt 143.2 lb

## 2010-12-27 DIAGNOSIS — K219 Gastro-esophageal reflux disease without esophagitis: Secondary | ICD-10-CM

## 2010-12-27 DIAGNOSIS — J45909 Unspecified asthma, uncomplicated: Secondary | ICD-10-CM

## 2010-12-27 MED ORDER — MOMETASONE FURO-FORMOTEROL FUM 100-5 MCG/ACT IN AERO: INHALATION_SPRAY | RESPIRATORY_TRACT | Status: DC

## 2010-12-27 MED ORDER — PREDNISONE (PAK) 10 MG PO TABS: ORAL_TABLET | ORAL | Status: AC

## 2010-12-27 NOTE — Patient Instructions (Signed)
dulera 100 Take 2 puffs first thing in am and then another 2 puffs about 12 hours later as needed  If not better, start the Prednisone 10 mg take  4 each am x 2 days,   2 each am x 2 days,  1 each am x2days and stop    If you are satisfied with your treatment plan let your doctor know and he/she can either refill your medications or you can return here when your prescription runs out.     If in any way you are not 100% satisfied,  please tell us.  If 100% better, tell your friends!

## 2010-12-27 NOTE — Progress Notes (Signed)
Subjective:     Patient ID: [name] [name], female   DOB: [birth year], 45 y.o.   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   12/27/2010 f/u ov/Bradly Sangiovanni cc seasonal itching sneezing then onset of wheezing with dusting and vacuuming x 6 months better when not cleaning and not needing any form of rescue or maint rx then x 24 hours acutely much worse, used daughter's albuterol benefit x 5 hours only.   Spring = fall needs clariton prn. Minimal dry cough  Pt denies any significant sore throat, dysphagia, excess/ purulent nasal secretions,  fever, chills, sweats, unintended wt loss, pleuritic or exertional cp, hempoptysis, orthopnea pnd or leg swelling.  Also denies presyncope, palpitations, heartburn, abdominal pain, nausea, vomiting, diarrhea  or change in bowel or urinary habits, dysuria,hematuria,  rash, arthralgias, visual complaints, headache, numbness weakness or ataxia.  Also denies any obvious fluctuation of symptoms with weather or environmental changes or other aggravating or alleviating factors.        Allergies   1) ! Sulfa    Past Medical History:    G2 P2--  GERD  Asthma   .  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 Never smoked, rare EtOH, no drugs, 3 caffeinated drinks daily. Walk for 30 min 3-4 days/wk     Review of Systems     Objective:   Physical Exam    GEN: A/Ox3; pleasant , NAD  Wt 143 12/27/2010  HEENT: [place]/AT, , EACs-clear, TMs-wnl, NOSE-clear, THROAT-clear  NECK: Supple w/ fair ROM; no JVD; normal carotid impulses w/o bruits; no thyromegaly or nodules  palpated; no lymphadenopathy.  RESP clear to A and P,  No cough on insp or exp maneuvers  CARD: RRR, no m/r/g  GI: Soft & nt; nml bowel sounds; no organmegaly, no guarding or rebound.  Musco: Warm bil, no calf tenderness edema, clubbing, pulses intact   .   Assessment:         Plan:

## 2010-12-27 NOTE — Assessment & Plan Note (Signed)
Acute flare of mild intermittent asthma ? Triggered by allergic mechanism.  Rec trial of dulera 100 dosed 2 pffs every 12 hours as needed  The proper method of use, as well as anticipated side effects, of this metered-dose inhaler are discussed and demonstrated to the patient. Improved to 90% with coaching

## 2010-12-27 NOTE — Assessment & Plan Note (Signed)
Diet reviewed - if respiratory symptoms remain difficult to control on dulera would restart aggressive PPI rx

## 2011-04-12 ENCOUNTER — Ambulatory Visit (INDEPENDENT_AMBULATORY_CARE_PROVIDER_SITE_OTHER): Payer: 59 | Admitting: Obstetrics & Gynecology

## 2011-04-12 ENCOUNTER — Encounter: Payer: Self-pay | Admitting: Obstetrics & Gynecology

## 2011-04-12 DIAGNOSIS — N852 Hypertrophy of uterus: Secondary | ICD-10-CM

## 2011-04-12 DIAGNOSIS — N920 Excessive and frequent menstruation with regular cycle: Secondary | ICD-10-CM

## 2011-04-12 DIAGNOSIS — Z0000 Encounter for general adult medical examination without abnormal findings: Secondary | ICD-10-CM

## 2011-04-12 DIAGNOSIS — Z113 Encounter for screening for infections with a predominantly sexual mode of transmission: Secondary | ICD-10-CM

## 2011-04-12 DIAGNOSIS — Z01419 Encounter for gynecological examination (general) (routine) without abnormal findings: Secondary | ICD-10-CM

## 2011-04-12 DIAGNOSIS — Z1272 Encounter for screening for malignant neoplasm of vagina: Secondary | ICD-10-CM

## 2011-04-12 NOTE — Progress Notes (Signed)
Subjective:    [name] [name] is a 45 y.o. female who presents for an annual exam. The patient has no complaints today. She complains of "brutal" monthly periods that are very painful and heavy and last 3 days. Has the patient ever been transfused or tattooed?: no. The patient reports that there is not domestic violence in her life.   Menstrual History: OB History    Grav Para Term Preterm Abortions TAB SAB Ect Mult Living   2 2              Menarche age: 52 Patient's last menstrual period was 03/27/2011.    The following portions of the patient's history were reviewed and updated as appropriate: allergies, current medications, past family history, past medical history, past social history, past surgical history and problem list.  Review of Systems A comprehensive review of systems was negative. 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 Her last pap and mammogram were many years ago.   Objective:    BP 123/78  Pulse 74  Temp(Src) 98 F (36.7 C) (Oral)  Ht 5\' 1"  (1.549 m)  Wt 141 lb (63.957 kg)  BMI 26.64 kg/m2  LMP 03/27/2011  General Appearance:    Alert, cooperative, no distress, appears stated age  Head:    Normocephalic, without obvious abnormality, atraumatic  Eyes:    PERRL, conjunctiva/corneas clear, EOM's intact, fundi    benign, both eyes  Ears:    Normal TM's and external ear canals, both ears  Nose:   Nares normal, septum midline, mucosa normal, no drainage    or sinus tenderness  Throat:   Lips, mucosa, and tongue normal; teeth and gums normal  Neck:   Supple, symmetrical, trachea midline, no adenopathy;    thyroid:  no enlargement/tenderness/nodules; no carotid   bruit or JVD  Back:     Symmetric, no curvature, ROM normal, no CVA tenderness  Lungs:     Clear to auscultation bilaterally, respirations unlabored  Chest Wall:    No tenderness or deformity   Heart:    Regular rate and rhythm, S1 and S2 normal, no murmur,  rub   or gallop  Breast Exam:    No tenderness, masses, or nipple abnormality  Abdomen:     Soft, non-tender, bowel sounds active all four quadrants,    no masses, no organomegaly  Genitalia:    Normal female without lesion, discharge or tenderness, 12 week size uterus, non-enlarged adnexa     Extremities:   Extremities normal, atraumatic, no cyanosis or edema  Pulses:   2+ and symmetric all extremities  Skin:   Skin color, texture, turgor normal, no rashes or lesions  Lymph nodes:   Cervical, supraclavicular, and axillary nodes normal  Neurologic:   CNII-XII intact, normal strength, sensation and reflexes    throughout  .    Assessment:    Healthy female exam.  Uterine enlargement   Plan:     Mammogram. Pap smear.  Pelvic ultrasound

## 2011-04-13 ENCOUNTER — Other Ambulatory Visit: Payer: Self-pay | Admitting: Obstetrics & Gynecology

## 2011-04-13 ENCOUNTER — Ambulatory Visit (INDEPENDENT_AMBULATORY_CARE_PROVIDER_SITE_OTHER)
Admission: RE | Admit: 2011-04-13 | Discharge: 2011-04-13 | Disposition: A | Discharge status: Home / Self Care | Payer: 59 | Source: Ambulatory Visit | Attending: Obstetrics & Gynecology | Admitting: Obstetrics & Gynecology

## 2011-04-13 ENCOUNTER — Ambulatory Visit (HOSPITAL_BASED_OUTPATIENT_CLINIC_OR_DEPARTMENT_OTHER)
Admission: RE | Admit: 2011-04-13 | Discharge: 2011-04-13 | Disposition: A | Discharge status: Home / Self Care | Payer: 59 | Source: Ambulatory Visit | Attending: Obstetrics & Gynecology | Admitting: Obstetrics & Gynecology

## 2011-04-13 DIAGNOSIS — N852 Hypertrophy of uterus: Secondary | ICD-10-CM

## 2011-04-13 DIAGNOSIS — Z0000 Encounter for general adult medical examination without abnormal findings: Secondary | ICD-10-CM

## 2011-04-13 DIAGNOSIS — N920 Excessive and frequent menstruation with regular cycle: Secondary | ICD-10-CM

## 2011-04-13 DIAGNOSIS — Z1231 Encounter for screening mammogram for malignant neoplasm of breast: Secondary | ICD-10-CM | POA: Insufficient documentation

## 2011-04-13 LAB — CBC
Hemoglobin: 14 g/dL (ref 12.0–15.0)
MCH: 31 pg (ref 26.0–34.0)
Platelets: 246 10*3/uL (ref 150–400)
RBC: 4.51 MIL/uL (ref 3.87–5.11)
WBC: 8.3 10*3/uL (ref 4.0–10.5)

## 2011-04-13 LAB — TSH: TSH: 2.299 u[IU]/mL (ref 0.350–4.500)

## 2011-04-28 ENCOUNTER — Emergency Department
Admission: EM | Admit: 2011-04-28 | Discharge: 2011-04-28 | Disposition: A | Discharge status: Home / Self Care | Payer: 59 | Source: Home / Self Care | Attending: Family Medicine | Admitting: Family Medicine

## 2011-04-28 ENCOUNTER — Encounter: Payer: Self-pay | Admitting: *Deleted

## 2011-04-28 ENCOUNTER — Emergency Department: Admit: 2011-04-28 | Discharge: 2011-04-28 | Disposition: A | Discharge status: Home / Self Care | Payer: 59

## 2011-04-28 ENCOUNTER — Emergency Department: Admit: 2011-04-28 | Payer: 59

## 2011-04-28 DIAGNOSIS — S9030XA Contusion of unspecified foot, initial encounter: Secondary | ICD-10-CM

## 2011-04-28 DIAGNOSIS — S9032XA Contusion of left foot, initial encounter: Secondary | ICD-10-CM

## 2011-04-28 DIAGNOSIS — M79609 Pain in unspecified limb: Secondary | ICD-10-CM

## 2011-04-28 DIAGNOSIS — M79672 Pain in left foot: Secondary | ICD-10-CM

## 2011-04-28 DIAGNOSIS — M25579 Pain in unspecified ankle and joints of unspecified foot: Secondary | ICD-10-CM

## 2011-04-28 NOTE — ED Notes (Signed)
Pt c/o LT foot injury x 12/24. She has taken tylenol and IBF for pain.
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
## 2011-07-07 ENCOUNTER — Encounter: Payer: Self-pay | Admitting: *Deleted

## 2011-07-07 ENCOUNTER — Emergency Department
Admission: EM | Admit: 2011-07-07 | Discharge: 2011-07-07 | Disposition: A | Discharge status: Home Health | Payer: 59 | Source: Home / Self Care | Attending: Family Medicine | Admitting: Family Medicine

## 2011-07-07 DIAGNOSIS — J069 Acute upper respiratory infection, unspecified: Secondary | ICD-10-CM

## 2011-07-07 MED ORDER — AZITHROMYCIN 250 MG PO TABS: ORAL_TABLET | ORAL | Status: AC

## 2011-07-07 MED ORDER — BENZONATATE 200 MG PO CAPS: 200.0000 mg | ORAL_CAPSULE | Freq: Every day | ORAL | Status: AC

## 2011-07-07 NOTE — ED Notes (Signed)
Patient c/o sinus pain, HA, ear pain and drainage x 2 days. Taken advil cold and sinus OTC. Temp of 100.5 this AM

## 2011-07-07 NOTE — Discharge Instructions (Signed)
Take Mucinex D (guaifenesin with decongestant) twice daily for congestion, or Mucinex plus Sudafed or Advil Cold/sinus.  Increase fluid intake, rest. May use Afrin nasal spray (or generic oxymetazoline) twice daily for about 5 days.  Also recommend using saline nasal spray several times daily and saline nasal irrigation (AYR is a common brand) Stop all antihistamines for now, and other non-prescription cough/cold preparations. Begin Azithromycin if not improving about 5 days or if persistent fever develops. Follow-up with family doctor if not improving 7 to 10 days.

## 2011-07-07 NOTE — ED Provider Notes (Signed)
History     CSN: [id number]  Arrival date & time 07/07/11  1149   First MD Initiated Contact with Patient 07/07/11 1214      Chief Complaint  Patient presents with  . Sinusitis      HPI Comments: Patient complains of approximately 2 day history of gradually progressive URI symptoms beginning with a mild sore throat (now improved), followed by progressive nasal congestion.  A cough started next. Complains of fatigue but no myalgias. Cough is now worse at night and generally non-productive during the day.  There has been no pleuritic pain, shortness of breath, or wheezes.  She has a history of perennial allergies and has had recurring/intermittent sinus congestion with clear mucous over the past 6 weeks.  Only over the past two days has begun to feel ill.  She also has asthma but has not needed her Dulera inhaler recently.  Patient is a 46 y.o. female presenting with sinusitis. The history is provided by the patient.  Sinusitis  This is a new problem. The current episode started more than 2 days ago. The problem has been gradually worsening. The maximum temperature recorded prior to her arrival was 100 to 100.9 F. The patient is experiencing no pain. Associated symptoms include chills, congestion, hoarse voice, sinus pressure, sore throat and cough. Pertinent negatives include no sweats, no ear pain, no swollen glands and no shortness of breath. Treatments tried: Advil cold/sinus. The treatment provided mild relief.    Past Medical History  Diagnosis Date  . GERD (gastroesophageal reflux disease)   . Asthma   . IBS (irritable bowel syndrome)   . Murmur   . Hydronephrosis, right     chronic    Past Surgical History  Procedure Date  . Tonsillectomy   . Hand surgery     LT 2002  . Pyeloplasty     RT kidney 1993  . Pyleoplasty     right kidney    Family History  Problem Relation Age of Onset  . Alcohol abuse Father   . Depression Father   . Stroke Father   . Hypertension  Father   . Heart attack Father   . Esophageal cancer Father   . Breast cancer Mother     metastatic to lungs  . Heart disease Mother   . Hypertension Mother   . Cancer Mother     breast and lung  . Heart attack Mother   . Mental illness Paternal Aunt     History  Substance Use Topics  . Smoking status: Never Smoker   . Smokeless tobacco: Never Used  . Alcohol Use: No    OB History    Grav Para Term Preterm Abortions TAB SAB Ect Mult Living   2 2              Review of Systems  Constitutional: Positive for chills.  HENT: Positive for congestion, sore throat, hoarse voice and sinus pressure. Negative for ear pain.   Respiratory: Positive for cough. Negative for shortness of breath.    + mild sore throat + cough No pleuritic pain No wheezing + nasal congestion + post-nasal drainage ? sinus pain/pressure No itchy/red eyes ? Earache on right No hemoptysis No SOB + low grade fever, + chills No nausea No vomiting No abdominal pain No diarrhea No urinary symptoms No skin rashes + fatigue + myalgias + headache Used OTC meds without relief (Advil cold/sinus) Allergies  Sulfonamide derivatives  Home Medications   Current Outpatient Rx  Name Route Sig Dispense Refill  . AZITHROMYCIN 250 MG PO TABS  Take 2 tabs today; then begin one tab once daily for 4 more days (Rx void after 07/15/11) 6 each 0  . BENZONATATE 200 MG PO CAPS Oral Take 1 capsule (200 mg total) by mouth at bedtime. Take as needed for cough 12 capsule 0  . HYOSCYAMINE PO Oral Take 1 tablet by mouth 3 (three) times daily as needed.      Marland Kitchen LORATADINE 10 MG PO TABS Oral Take 10 mg by mouth daily.      . MOMETASONE FURO-FORMOTEROL FUM 100-5 MCG/ACT IN AERO  2 puffs every 12 hours 1 Inhaler 11    BP 128/86  Pulse 89  Temp(Src) 99.2 F (37.3 C) (Oral)  Resp 14  Ht 5\' 1"  (1.549 m)  Wt 142 lb (64.411 kg)  BMI 26.83 kg/m2  SpO2 98%  Physical Exam Nursing notes and Vital Signs  reviewed. Appearance:  Patient appears healthy, stated age, and in no acute distress Eyes:  Pupils are equal, round, and reactive to light and accomodation.  Extraocular movement is intact.  Conjunctivae are not inflamed  Ears:  Canals normal.  Tympanic membranes normal.  Nose:  Mildly congested turbinates.  No sinus tenderness.  Pharynx:  Normal Neck:  Supple.   Posterior nodes are prominent but nontender Lungs:  Clear to auscultation.  Breath sounds are equal.  Heart:  Regular rate and rhythm without murmurs, rubs, or gallops.  Abdomen:  Nontender without masses or hepatosplenomegaly.  Bowel sounds are present.  No CVA or flank tenderness.  Extremities:  No edema.  No calf tenderness Skin:  No rash present.   ED Course  Procedures  none      1. Acute upper respiratory infections of unspecified site       MDM  There is no evidence of bacterial infection today.   Treat symptomatically for now: Rx for Tessalon at bedtime Take Mucinex D (guaifenesin with decongestant) twice daily for congestion, or Mucinex plus Sudafed or Advil Cold/sinus.  Increase fluid intake, rest. May use Afrin nasal spray (or generic oxymetazoline) twice daily for about 5 days.  Also recommend using saline nasal spray several times daily and saline nasal irrigation (AYR is a common brand) Stop all antihistamines for now, and other non-prescription cough/cold preparations. Begin Azithromycin if not improving about 5 days or if persistent fever develops (Given a prescription to hold, with an expiration date)  Resume Dulera if develops increasing chest congestion/wheezes. Follow-up with family doctor if not improving 7 to 10 days.         Donna Christen, MD 07/07/11 1246

## 2011-10-06 ENCOUNTER — Telehealth: Payer: Self-pay | Admitting: Internal Medicine

## 2011-10-06 NOTE — Telephone Encounter (Signed)
lmomtcb x1--pt last seen 12/27/10--needs OV w/ TP

## 2011-10-07 ENCOUNTER — Ambulatory Visit (INDEPENDENT_AMBULATORY_CARE_PROVIDER_SITE_OTHER): Payer: 59 | Admitting: Adult Health

## 2011-10-07 ENCOUNTER — Encounter: Payer: Self-pay | Admitting: Adult Health

## 2011-10-07 VITALS — BP 118/82 | HR 89 | Temp 98.1°F | Ht 61.0 in | Wt 136.0 lb

## 2011-10-07 DIAGNOSIS — F430 Acute stress reaction: Secondary | ICD-10-CM

## 2011-10-07 DIAGNOSIS — F439 Reaction to severe stress, unspecified: Secondary | ICD-10-CM | POA: Insufficient documentation

## 2011-10-07 MED ORDER — HYOSCYAMINE SULFATE 0.125 MG SL SUBL: 0.1250 mg | SUBLINGUAL_TABLET | Freq: Three times a day (TID) | SUBLINGUAL | Status: DC | PRN

## 2011-10-07 MED ORDER — ALPRAZOLAM 0.25 MG PO TABS
0.2500 mg | ORAL_TABLET | Freq: Every day | ORAL | Status: AC | PRN
Start: 2011-10-07 — End: 2011-11-06

## 2011-10-07 NOTE — Patient Instructions (Signed)
Continue with counseling - this is very important  May xanax 0.25mg  daily As needed  Anxiety.  Stress reducers with exercise  Advance bland diet as tolerated.  Gas  X. As needed   Levsin As needed  Stomach cramps.  Please contact office for sooner follow up if symptoms do not improve or worsen or seek emergency care

## 2011-10-07 NOTE — Telephone Encounter (Signed)
Pt has been scheduled to see TP this morning at 11"15 to discuss GI issues and refills.

## 2011-10-07 NOTE — Assessment & Plan Note (Addendum)
Stress reaction with anxiety  Have encouraged counseling and friend support   Plan;  Continue with counseling - this is very important  May xanax 0.25mg  daily As needed  Anxiety.  Stress reducers with exercise  Advance bland diet as tolerated.  Gas  X. As needed   Levsin As needed  Stomach cramps.  Please contact office for sooner follow up if symptoms do not improve or worsen or seek emergency care

## 2011-10-07 NOTE — Progress Notes (Signed)
Subjective:     Patient ID: [name] [name], female   DOB: May 04, 1965, 46 y.o.   MRN: [id number]  HPI 5  yowf rn, never smoker  with known hx of Allergies and intermittent asthma.   12/27/10/u ov/Wert cc seasonal itching sneezing then onset of wheezing with dusting and vacuuming x 6 months better when not cleaning and not needing any form of rescue or maint rx then x 24 hours acutely much worse, used daughter's albuterol benefit x 5 hours only.   Spring = fall needs clariton prn. Minimal dry cough >>Dulera rx   10/07/2011 Acute OV  Complains of loose stools, abd cramps , low appetite and severe stress.  Husband of 22 years left her 1 week ago with no warning. They have 2 teenage daughters and  she works nights at hospital . Has had to get friends to help stay with girls overnight.  She went to counseling this week .  Feels upset.  No thoughts of suicidal/homicidal ideation.  Feels sad and anxious at times.  No fever ,bloody stools , rash, chest pain or dyspena.  Has strong friend and work support       Allergies   1) ! Sulfa    Past Medical History:    G2 P2--  GERD  Asthma   .  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 Never smoked, rare EtOH, no drugs, 3 caffeinated drinks daily. Walk for 30 min 3-4 days/wk     Review of Systems Constitutional:   No  weight loss, night sweats,  Fevers, chills, fatigue, or  lassitude.  HEENT:   No headaches,  Difficulty swallowing,  Tooth/dental problems, or  Sore throat,                No sneezing, itching, ear ache, nasal congestion, post nasal drip,   CV:  No chest pain,  Orthopnea, PND,  swelling in lower extremities, anasarca, dizziness, palpitations, syncope.   GI  No heartburn, indigestion,   bloody stools.   Resp: No shortness of breath with exertion or at rest.  No excess mucus, no productive cough,  No non-productive cough,  No coughing up of blood.  No change in color of mucus.  No wheezing.  No chest wall deformity  Skin: no rash or lesions.  GU: no dysuria, change in color of urine, no urgency or frequency.  No flank pain, no hematuria   MS:  No joint pain or swelling.  No decreased range of motion.  No back pain.  Psych:     No memory loss.          Objective:   Physical Exam     GEN: A/Ox3; pleasant , NAD    HEENT: [place]/AT, , EACs-clear, TMs-wnl, NOSE-clear, THROAT-clear  NECK: Supple w/ fair ROM; no JVD; normal carotid impulses w/o bruits; no thyromegaly or nodules palpated; no lymphadenopathy.  RESP clear to A and P,  No cough on insp or exp maneuvers  CARD: RRR, no m/r/g  GI: Soft & nt; nml bowel sounds; no organmegaly, no guarding or rebound.  Musco: Warm bil, no calf tenderness edema, clubbing, pulses intact   .  Assessment:         Plan:

## 2011-10-11 ENCOUNTER — Telehealth: Payer: Self-pay | Admitting: Internal Medicine

## 2011-10-11 MED ORDER — ZOLPIDEM TARTRATE 10 MG PO TABS: 10.0000 mg | ORAL_TABLET | Freq: Every evening | ORAL | Status: DC | PRN

## 2011-10-11 NOTE — Telephone Encounter (Signed)
Spoke with pt and gave her instructions on the [place] and not to take  It with the xanax. Called rx into [facility] out pt pharmacy .

## 2011-10-11 NOTE — Telephone Encounter (Signed)
Spoke with patient still having issues with sleep,only sleeping about 2 hours night.  Xanax does not seem to be helping.Crying a lot. Appetite has improved alittle.,stomach cramps not as bad.Pt is requesting something a little stronger to help her sleep.Would like this called to [facility] out pt pharmacy. TP please advise.  Thank you

## 2011-10-11 NOTE — Telephone Encounter (Signed)
Ambien 10mg  At bedtime At bedtime  As needed  Insomnia   #30 . No refills  Do not take with xanax .  Call back if not improving.  Please contact office for sooner follow up if symptoms do not improve or worsen or seek emergency care

## 2011-10-13 ENCOUNTER — Telehealth: Payer: Self-pay | Admitting: Internal Medicine

## 2011-10-13 NOTE — Telephone Encounter (Signed)
I spoke with pt and she states health port was going to send FMLA paperwork to TP since she saw her last not MW. Tammy, have you seen FMLA paperwork on pt, please advise thanks

## 2011-10-17 NOTE — Telephone Encounter (Signed)
Spoke with pt and notified that she will need to contact her employer b/c we have not received any FMLA forms. She verbalized understanding and states nothing further needed.

## 2011-10-17 NOTE — Telephone Encounter (Signed)
No sign of FMLA paperwork on TP's desk and she is currently not in the office.  Called Fleet Contras in [place] who reported that she does not have any FMLA paperwork filed for pt.  Pt needs to contact her employer.  LMOM TCB x1.

## 2011-10-17 NOTE — Telephone Encounter (Signed)
Patient returning call.  [phone]

## 2011-10-17 NOTE — Telephone Encounter (Signed)
ATC the pt, Line busy [facility]

## 2011-10-19 ENCOUNTER — Telehealth: Payer: Self-pay | Admitting: Internal Medicine

## 2011-10-19 NOTE — Telephone Encounter (Signed)
Pt states she has not been using Dulera but has had emotional times lately and thinks this is causing her to have thrush. Pt aware that MW is out of the office until Thursday am; will call back once I have answer.

## 2011-10-19 NOTE — Telephone Encounter (Signed)
LMTCB

## 2011-10-20 MED ORDER — CLOTRIMAZOLE 10 MG MT TROC: 10.0000 mg | Freq: Four times a day (QID) | OROMUCOSAL | Status: AC

## 2011-10-20 NOTE — Telephone Encounter (Signed)
Pt aware of MW recs. She voiced her understanding and had no questions

## 2011-10-20 NOTE — Telephone Encounter (Signed)
Clotrimazole 10 mg troche qid x 3 days should work or needs ov to regroup re asthma rx

## 2012-01-27 ENCOUNTER — Other Ambulatory Visit: Payer: Self-pay | Admitting: Adult Health

## 2012-02-01 ENCOUNTER — Other Ambulatory Visit: Payer: Self-pay | Admitting: Sports Medicine

## 2012-02-01 DIAGNOSIS — M79643 Pain in unspecified hand: Secondary | ICD-10-CM

## 2012-02-01 NOTE — Telephone Encounter (Signed)
Received electronic refill request for ambien 10mg  qhs prn and alprazolam 0.25mg  qd prn.  Pt last seen 6.7.13 by TP > follow up in 2 months w/ MW >> no appt made, none pending.  Alprazolam no longer active on med list.  Dr [name], may pt have a refill?  Notification will be sent that she is due for follow up.  Thanks.

## 2012-02-02 ENCOUNTER — Ambulatory Visit (INDEPENDENT_AMBULATORY_CARE_PROVIDER_SITE_OTHER): Payer: 59

## 2012-02-02 ENCOUNTER — Ambulatory Visit (INDEPENDENT_AMBULATORY_CARE_PROVIDER_SITE_OTHER): Payer: 59 | Admitting: Sports Medicine

## 2012-02-02 ENCOUNTER — Encounter: Payer: Self-pay | Admitting: *Deleted

## 2012-02-02 ENCOUNTER — Encounter: Payer: Self-pay | Admitting: Sports Medicine

## 2012-02-02 VITALS — BP 109/72 | HR 82 | Ht 61.0 in | Wt 130.0 lb

## 2012-02-02 DIAGNOSIS — M79643 Pain in unspecified hand: Secondary | ICD-10-CM

## 2012-02-02 DIAGNOSIS — M25549 Pain in joints of unspecified hand: Secondary | ICD-10-CM | POA: Insufficient documentation

## 2012-02-02 DIAGNOSIS — M79609 Pain in unspecified limb: Secondary | ICD-10-CM

## 2012-02-02 MED ORDER — ALPRAZOLAM 0.25 MG PO TABS: 0.2500 mg | ORAL_TABLET | Freq: Every day | ORAL | Status: DC | PRN

## 2012-02-02 MED ORDER — ZOLPIDEM TARTRATE 10 MG PO TABS: 10.0000 mg | ORAL_TABLET | Freq: Every evening | ORAL | Status: DC | PRN

## 2012-02-02 NOTE — Addendum Note (Signed)
Addended by: Boone Master E on: 02/02/2012 10:24 AM   Modules accepted: Orders

## 2012-02-02 NOTE — Progress Notes (Signed)
Subjective:    CC: Right knuckle pain  HPI: [name] [name] is a very pleasant 46 year old female comes in with a two-month history of pain which she localizes along the ulnar aspect of her right third metacarpophalangeal joint. She remembers an episode when walking the dogs 2 months ago, with the dog started running, and jerked her hand.  Initially, the pain was localized over the entire metacarpophalangeal joint with pain, and swelling. There is no radiation. It is particularly bad when trying to open a doorknob.  Since then the pain is improved, the swelling has improved, but it is still persistent, and she still experiences a sharp type pain in that location.  Past medical history, Surgical history, Family history, Social history, Allergies, and medications have been entered into the medical record, reviewed, and no changes needed.   Review of Systems: No headache, visual changes, nausea, vomiting, diarrhea, constipation, dizziness, abdominal pain, skin rash, fevers, chills, night sweats, weight loss, swollen lymph nodes, body aches, joint swelling, muscle aches, chest pain, or shortness of breath.   Objective:   Vitals:  Afebrile, vital signs stable. General: Well Developed, well nourished, and in no acute distress.  Neuro/Psych: Alert and oriented x3, extra-ocular muscles intact, able to move all 4 extremities.  Skin: Warm and dry, no rashes noted.  Respiratory: Not using accessory muscles, speaking in full sentences, trachea midline.  Cardiovascular: Pulses palpable, no extremity edema. Abdomen: Does not appear distended. Right hand does show some fullness over the metacarpophalangeal joint, third. She has full range of motion to MCP flexion, and extension, no pain with resisted motion. The ulnar, and radial collateral ligaments are stable in both 30 of flexion, as well as full extension. Stressing the ligaments is not painful. There is discrete tenderness to palpation over the ulnar aspect of the  dorsal third metacarpophalangeal joint. She's neurovascularly intact distally  I obtained x-rays, 3 views of her right hand, the show no sign of fracture, dislocation, avulsion, or degenerative change.  Procedure: Real-time Ultrasound Guided Injection of right third metacarpophalangeal joint for diagnostic and therapeutic indications Device: GE Logiq E  Ultrasound guided injection is preferred based studies that show increased duration, increased effect, greater accuracy, decreased procedural pain, increased response rate, and decreased cost with ultrasound guided versus blind injection.  Verbal informed consent obtained.  Time-out conducted.  Noted no overlying erythema, induration, or other signs of local infection.  Skin prepped in a sterile fashion.  Local anesthesia: Topical Ethyl chloride.  With sterile technique and under real time ultrasound guidance:  Needle was advanced in short axis, synovium did appear somewhat thickened. The bones, as well as extensor digitorum tendons appear normal, there was no fluid in the tendon sheath. Once the needle was confirmed in joint, 1/2 cc Kenalog 40, 1 cc lidocaine injected easily, and joint capsule seen distending, video obtained. Completed without difficulty  Pain immediately resolved suggesting accurate placement of the medication.  Advised to call if fevers/chills, erythema, induration, drainage, or persistent bleeding.  Images permanently stored and available for review in the ultrasound unit.  Impression: Technically successful ultrasound guided injection.  Impression and Recommendations:   This case required medical decision making of moderate complexity.

## 2012-02-02 NOTE — Telephone Encounter (Signed)
Refills telephoned to [facility] w/ a note that pt is due for follow up and will not receive refills until she is seen in the office.

## 2012-02-02 NOTE — Assessment & Plan Note (Signed)
She has had pain for 2 months, and has failed over-the-counter oral analgesics. I think she initially had a simple sprain, however this is progressed to a synovitis. I injected the joint under ultrasound guidance, some of her pain did resolve suggesting that the joint, and capsule are pain generating structures. She will see me back in 3-4 weeks, and if pain is still present I would like to get an MRI of her right hand.

## 2012-02-17 ENCOUNTER — Ambulatory Visit: Payer: 59 | Admitting: Sports Medicine

## 2012-02-20 ENCOUNTER — Ambulatory Visit: Payer: 59 | Admitting: Sports Medicine

## 2012-02-20 DIAGNOSIS — Z0289 Encounter for other administrative examinations: Secondary | ICD-10-CM

## 2012-02-27 ENCOUNTER — Ambulatory Visit (INDEPENDENT_AMBULATORY_CARE_PROVIDER_SITE_OTHER): Payer: 59 | Admitting: Sports Medicine

## 2012-02-27 ENCOUNTER — Encounter: Payer: Self-pay | Admitting: Sports Medicine

## 2012-02-27 VITALS — BP 130/83 | HR 81 | Wt 130.0 lb

## 2012-02-27 DIAGNOSIS — M25549 Pain in joints of unspecified hand: Secondary | ICD-10-CM

## 2012-02-27 NOTE — Assessment & Plan Note (Signed)
2-1/2 months of pain resolved after a single ultrasound-guided metacarpophalangeal joint injection. She will return to see me on an as-needed basis. Of note, her and her husband do go to the [employer] car show. I will likely see them there at the next show.

## 2012-02-27 NOTE — Progress Notes (Signed)
Subjective:    CC: Followup right third metacarpophalangeal joint pain  HPI: Kihn comes back for followup of pain in her right hand. 2 a half months prior to her visit, had a injury where a dog jerked its leash, spraining her right third metacarpophalangeal joint. She had persistent pain that failed all conservative treatment. I injected the joint under ultrasound guidance approximately one month ago, and she returns today completely pain-free.  Past medical history, Surgical history, Family history, Social history, Allergies, and medications have been entered into the medical record, reviewed, and no changes needed.   Review of Systems: No headache, visual changes, nausea, vomiting, diarrhea, constipation, dizziness, abdominal pain, skin rash, fevers, chills, night sweats, weight loss, swollen lymph nodes, body aches, joint swelling, muscle aches, chest pain, or shortness of breath.   Objective:   Vitals:  Afebrile, vital signs stable. General: Well Developed, well nourished, and in no acute distress.  Neuro/Psych: Alert and oriented x3, extra-ocular muscles intact, able to move all 4 extremities.  Skin: Warm and dry, no rashes noted.  Respiratory: Not using accessory muscles, speaking in full sentences, trachea midline.  Cardiovascular: Pulses palpable, no extremity edema. Abdomen: Does not appear distended. Entire hand is unremarkable to inspection.  Impression and Recommendations:   This case required medical decision making of moderate complexity.

## 2012-03-22 ENCOUNTER — Emergency Department
Admission: EM | Admit: 2012-03-22 | Discharge: 2012-03-22 | Disposition: A | Discharge status: Home / Self Care | Payer: 59 | Source: Home / Self Care

## 2012-03-22 ENCOUNTER — Encounter: Payer: Self-pay | Admitting: Emergency Medicine

## 2012-03-22 DIAGNOSIS — L0390 Cellulitis, unspecified: Secondary | ICD-10-CM

## 2012-03-22 DIAGNOSIS — L02419 Cutaneous abscess of limb, unspecified: Secondary | ICD-10-CM

## 2012-03-22 DIAGNOSIS — W57XXXA Bitten or stung by nonvenomous insect and other nonvenomous arthropods, initial encounter: Secondary | ICD-10-CM

## 2012-03-22 DIAGNOSIS — S90569A Insect bite (nonvenomous), unspecified ankle, initial encounter: Secondary | ICD-10-CM

## 2012-03-22 DIAGNOSIS — L03119 Cellulitis of unspecified part of limb: Secondary | ICD-10-CM

## 2012-03-22 MED ORDER — CEPHALEXIN 500 MG PO CAPS: 500.0000 mg | ORAL_CAPSULE | Freq: Three times a day (TID) | ORAL | Status: AC

## 2012-03-22 MED ORDER — PREDNISONE 50 MG PO TABS: ORAL_TABLET | ORAL | Status: DC

## 2012-03-22 MED ORDER — METHYLPREDNISOLONE ACETATE 80 MG/ML IJ SUSP: 80.0000 mg | Freq: Once | INTRAMUSCULAR | Status: DC

## 2012-03-22 NOTE — ED Provider Notes (Signed)
History     CSN: [id number]  Arrival date & time 03/22/12  1191   First MD Initiated Contact with Patient 03/22/12 [id number]      Chief Complaint  Patient presents with  . Insect Bite   HPI HPI  This patient complains of a RASH  Location: R leg  Onset: Pt was doing yard work on Tuesday and was stung by [employer]. Has had progressive redness, swelling and warmth since this point.   Course: Progressive redness, swelling, and warmth since bee sting.   Self-treated with: nothing   Improvement with treatment: n/a  History  Itching: yes  Tenderness: mild  New medications/antibiotics: no  Pet exposure: no  Recent travel or tropical exposure: no  New soaps, shampoos, detergent, clothing: no  Tick/insect exposure: yes  Chemical Exposure: no  Red Flags  Feeling ill: no  Fever: no  Facial/tongue swelling/difficulty breathing: no  Diabetic or immunocompromised: no    Past Medical History  Diagnosis Date  . GERD (gastroesophageal reflux disease)   . Asthma   . IBS (irritable bowel syndrome)   . Murmur   . Hydronephrosis, right     chronic    Past Surgical History  Procedure Date  . Tonsillectomy   . Hand surgery     LT 2002  . Pyeloplasty     RT kidney 1993  . Pyleoplasty     right kidney    Family History  Problem Relation Age of Onset  . Alcohol abuse Father   . Depression Father   . Stroke Father   . Hypertension Father   . Heart attack Father   . Esophageal cancer Father   . Breast cancer Mother     metastatic to lungs  . Heart disease Mother   . Hypertension Mother   . Cancer Mother     breast and lung  . Heart attack Mother   . Mental illness Paternal Aunt     History  Substance Use Topics  . Smoking status: Never Smoker   . Smokeless tobacco: Never Used  . Alcohol Use: No    OB History    Grav Para Term Preterm Abortions TAB SAB Ect Mult Living   2 2              Review of Systems  All other systems reviewed and are  negative.    Allergies  Sulfonamide derivatives  Home Medications   Current Outpatient Rx  Name  Route  Sig  Dispense  Refill  . ALPRAZOLAM 0.25 MG PO TABS   Oral   Take 1 tablet (0.25 mg total) by mouth daily as needed for anxiety.   30 tablet   0   . CEPHALEXIN 500 MG PO CAPS   Oral   Take 1 capsule (500 mg total) by mouth 3 (three) times daily.   30 capsule   0   . HYOSCYAMINE SULFATE 0.125 MG SL SUBL   Sublingual   Place 0.125 mg under the tongue 3 (three) times daily as needed.         Marland Kitchen HYOSCYAMINE PO   Oral   Take 1 tablet by mouth 3 (three) times daily as needed.           Marland Kitchen LORATADINE 10 MG PO TABS   Oral   Take 10 mg by mouth daily.           . MOMETASONE FURO-FORMOTEROL FUM 100-5 MCG/ACT IN AERO      2 puffs every  12 hours   1 Inhaler   11   . PREDNISONE 50 MG PO TABS      1 tab daily x 5 days   5 tablet   0   . ZOLPIDEM TARTRATE 10 MG PO TABS   Oral   Take 1 tablet (10 mg total) by mouth at bedtime as needed for sleep.   30 tablet   0     BP 133/81  Pulse 88  Temp 98.7 F (37.1 C) (Oral)  Resp 16  Ht 5\' 1"  (1.549 m)  Wt 130 lb (58.968 kg)  BMI 24.56 kg/m2  SpO2 99%  LMP 03/12/2012  Physical Exam  Constitutional: She appears well-developed and well-nourished.  HENT:  Head: Normocephalic and atraumatic.  Eyes: Conjunctivae normal are normal. Pupils are equal, round, and reactive to light.  Neck: Normal range of motion. Neck supple.  Cardiovascular: Normal rate and regular rhythm.   Pulmonary/Chest: Effort normal and breath sounds normal.  Abdominal: Soft.  Musculoskeletal: Normal range of motion.  Neurological: She is alert.  Skin: Rash noted.     + warmth over affected area, minimal fluctuance, no areas of focal abscess  ED Course  Procedures (including critical care time)  Labs Reviewed - No data to display No results found.   1. Insect bite   2. Cellulitis       MDM  Depomedrol 80mg  IM x1 for  inflammatory component.  Prednisone x 5 days.  Will place on keflex for infectious coverage.  Plan for follow up in 3-5 days.  Infectious red flags reviewed.      The patient and/or caregiver has been counseled thoroughly with regard to treatment plan and/or medications prescribed including dosage, schedule, interactions, rationale for use, and possible side effects and they verbalize understanding. Diagnoses and expected course of recovery discussed and will return if not improved as expected or if the condition worsens. Patient and/or caregiver verbalized understanding.             Doree Albee, MD 03/22/12 1040

## 2012-03-22 NOTE — ED Notes (Signed)
Bee sting on back upper left leg

## 2012-06-25 ENCOUNTER — Ambulatory Visit (INDEPENDENT_AMBULATORY_CARE_PROVIDER_SITE_OTHER): Payer: 59

## 2012-06-25 ENCOUNTER — Ambulatory Visit (INDEPENDENT_AMBULATORY_CARE_PROVIDER_SITE_OTHER): Payer: 59 | Admitting: Sports Medicine

## 2012-06-25 DIAGNOSIS — M76899 Other specified enthesopathies of unspecified lower limb, excluding foot: Secondary | ICD-10-CM

## 2012-06-25 DIAGNOSIS — M76891 Other specified enthesopathies of right lower limb, excluding foot: Secondary | ICD-10-CM

## 2012-06-25 DIAGNOSIS — M25551 Pain in right hip: Secondary | ICD-10-CM | POA: Insufficient documentation

## 2012-06-25 DIAGNOSIS — M7061 Trochanteric bursitis, right hip: Secondary | ICD-10-CM | POA: Insufficient documentation

## 2012-06-25 MED ORDER — TRAMADOL HCL 50 MG PO TABS: 50.0000 mg | ORAL_TABLET | Freq: Three times a day (TID) | ORAL | Status: DC | PRN

## 2012-06-25 NOTE — Progress Notes (Signed)
  Subjective:    CC: Right hip pain.  HPI: This very pleasant 47 year old female had an unfortunate fall off of a ladder approximately a month ago. She impacted her right hip, and is in pain she localizes just posterior to her right greater trochanter. It's worse with weightbearing, occasionally wakes her up at night particularly when lying on that side, worse when getting out of a car, pain is localized, does not radiate, hasn't really used any medications for this. She denies any pain in her back, her numbness and tingling going down the leg.  Past medical history, Surgical history, Family history not pertinant except as noted below, Social history, Allergies, and medications have been entered into the medical record, reviewed, and no changes needed.   Review of Systems: No headache, visual changes, nausea, vomiting, diarrhea, constipation, dizziness, abdominal pain, skin rash, fevers, chills, night sweats, weight loss, swollen lymph nodes, body aches, joint swelling, muscle aches, chest pain, shortness of breath, mood changes, visual or auditory hallucinations.   Objective:   General: Well Developed, well nourished, and in no acute distress.  Neuro/Psych: Alert and oriented x3, extra-ocular muscles intact, able to move all 4 extremities, sensation grossly intact. Skin: Warm and dry, no rashes noted.  Respiratory: Not using accessory muscles, speaking in full sentences, trachea midline.  Cardiovascular: Pulses palpable, no extremity edema. Abdomen: Does not appear distended. Right Hip: ROM IR: 45 Deg, ER: 45 Deg, Flexion: 120 Deg, Extension: 100 Deg, Abduction: 45 Deg, Adduction: 45 Deg Strength IR: 5/5, ER: 5/5, Flexion: 4/5, Extension: 5/5, Abduction: 4/5, Adduction: 5/5 Resisted hip flexion resisted hip abduction reproduces pain, abduction worse than flexion. Pelvic alignment unremarkable to inspection and palpation. Standing hip rotation and gait without trendelenburg sign /  unsteadiness. Greater trochanter without tenderness to palpation. No tenderness over piriformis and greater trochanter. No pain with FABER or FADIR. No SI joint tenderness and normal minimal SI movement.  X-rays were reviewed, there is no sign of fracture, dislocation, or degenerative change of either femoral acetabular joint. Pelvic rami, SI joints all appear unremarkable. Impression and Recommendations:   This case required medical decision making of moderate complexity.

## 2012-06-25 NOTE — Assessment & Plan Note (Signed)
Occurred one month ago. Aggressive hip abductor rehabilitation at home. Tramadol. X-rays. Return to see me in 3 weeks, consider MRI versus guided injection if no better.

## 2012-06-25 NOTE — Patient Instructions (Addendum)
Hip Rehabilitation Protocol:  1.  Side leg raises.  3x30 with no weight, then 3x15 with 2 lb ankle weight, then 3x15 with 5 lb ankle weight 2.  Standing hip rotation.  3x30 with no weight, then 3x15 with 2 lb ankle weight, then 3x15 with 5 lb ankle weight. 3.  Side step ups.  3x30 with no weight, then 3x15 with 5 lbs in backpack, then 3x15 with 10 lbs in backpack. 

## 2012-09-12 ENCOUNTER — Encounter: Payer: Self-pay | Admitting: Sports Medicine

## 2012-09-12 ENCOUNTER — Ambulatory Visit (INDEPENDENT_AMBULATORY_CARE_PROVIDER_SITE_OTHER): Payer: 59 | Admitting: Sports Medicine

## 2012-09-12 ENCOUNTER — Ambulatory Visit (HOSPITAL_COMMUNITY)
Admission: RE | Admit: 2012-09-12 | Discharge: 2012-09-12 | Disposition: A | Discharge status: Home / Self Care | Payer: 59 | Source: Ambulatory Visit | Attending: Sports Medicine | Admitting: Sports Medicine

## 2012-09-12 VITALS — BP 139/80 | HR 83 | Wt 139.0 lb

## 2012-09-12 DIAGNOSIS — M7989 Other specified soft tissue disorders: Secondary | ICD-10-CM | POA: Insufficient documentation

## 2012-09-12 DIAGNOSIS — M25549 Pain in joints of unspecified hand: Secondary | ICD-10-CM | POA: Insufficient documentation

## 2012-09-12 DIAGNOSIS — M25541 Pain in joints of right hand: Secondary | ICD-10-CM

## 2012-09-12 MED ORDER — MELOXICAM 15 MG PO TABS: ORAL_TABLET | ORAL | Status: DC

## 2012-09-12 NOTE — Assessment & Plan Note (Signed)
This occurred after injury. I injected the joint under guidance approximately 7-1/2 months ago, unfortunately pain has returned. Repeat injection today, Mobic. X-rays. Return in a month.

## 2012-09-12 NOTE — Progress Notes (Signed)
  Subjective:    CC: Reurrent pain.  HPI: This very pleasant 47 year old female comes back approximately 7 months after her last right third metacarpophalangeal joint injection. To recap, she had an injury where a dog jerked its leash, injuring her right third finger approximately 9-10 months ago. We treated her conservatively for a couple of months, when pain did not resolve I injected her joint. X-rays at that time did not reveal any fractures or degenerative change, however ultrasound did reveal a copious synovitis. Over the past 4 weeks, pain has returned as well as swelling. She localizes at the third metacarpophalangeal joint without radiation, moderate to severe, worsening.  Past medical history, Surgical history, Family history not pertinant except as noted below, Social history, Allergies, and medications have been entered into the medical record, reviewed, and no changes needed.   Review of Systems: No fevers, chills, night sweats, weight loss, chest pain, or shortness of breath.   Objective:    General: Well Developed, well nourished, and in no acute distress.  Neuro: Alert and oriented x3, extra-ocular muscles intact, sensation grossly intact.  HEENT: Normocephalic, atraumatic, pupils equal round reactive to light, neck supple, no masses, no lymphadenopathy, thyroid nonpalpable.  Skin: Warm and dry, no rashes. Cardiac: Regular rate and rhythm, no murmurs rubs or gallops, no lower extremity edema.  Respiratory: Clear to auscultation bilaterally. Not using accessory muscles, speaking in full sentences. Right hand does show some fullness over the metacarpophalangeal joint, third. She has full range of motion to MCP flexion, and extension, no pain with resisted motion. The ulnar, and radial collateral ligaments are stable in both 30 of flexion, as well as full extension. Stressing the ligaments is not painful. There is discrete tenderness to palpation over the ulnar aspect of the dorsal  third metacarpophalangeal joint. She's neurovascularly intact distally  Procedure: Real-time Ultrasound Guided Injection of right third metacarpophalangeal joint for diagnostic and therapeutic indications Device: GE Logiq E  Ultrasound guided injection is preferred based studies that show increased duration, increased effect, greater accuracy, decreased procedural pain, increased response rate, and decreased cost with ultrasound guided versus blind injection.  Verbal informed consent obtained.  Time-out conducted.  Noted no overlying erythema, induration, or other signs of local infection.  Skin prepped in a sterile fashion.  Local anesthesia: Topical Ethyl chloride.  With sterile technique and under real time ultrasound guidance:  Needle was advanced in short axis, synovium did appear somewhat thickened. The bones, as well as extensor digitorum tendons appear normal, there was no fluid in the tendon sheath. Once the needle was confirmed in joint, 1/2 cc Kenalog 40, 1/2 cc lidocaine injected easily, and joint capsule seen distending. Completed without difficulty  Pain immediately resolved suggesting accurate placement of the medication.  Advised to call if fevers/chills, erythema, induration, drainage, or persistent bleeding.  Images permanently stored and available for review in the ultrasound unit.  Impression: Technically successful ultrasound guided injection. Impression and Recommendations:

## 2012-10-10 ENCOUNTER — Ambulatory Visit (INDEPENDENT_AMBULATORY_CARE_PROVIDER_SITE_OTHER): Payer: 59 | Admitting: Sports Medicine

## 2012-10-10 ENCOUNTER — Encounter: Payer: Self-pay | Admitting: Sports Medicine

## 2012-10-10 VITALS — BP 128/79 | HR 94

## 2012-10-10 DIAGNOSIS — M25541 Pain in joints of right hand: Secondary | ICD-10-CM

## 2012-10-10 DIAGNOSIS — M25549 Pain in joints of unspecified hand: Secondary | ICD-10-CM

## 2012-10-10 NOTE — Progress Notes (Signed)
  Subjective:    CC: Followup.  HPI: Fakhouri returns, one month status post right third metacarpophalangeal joint injection. She is 100% pain free. No complaints. Unfortunately she did have a rash with meloxicam.  Past medical history, Surgical history, Family history not pertinant except as noted below, Social history, Allergies, and medications have been entered into the medical record, reviewed, and no changes needed.   Review of Systems: No fevers, chills, night sweats, weight loss, chest pain, or shortness of breath.   Objective:    General: Well Developed, well nourished, and in no acute distress.  Neuro: Alert and oriented x3, extra-ocular muscles intact, sensation grossly intact.  HEENT: Normocephalic, atraumatic, pupils equal round reactive to light, neck supple, no masses, no lymphadenopathy, thyroid nonpalpable.  Skin: Warm and dry, no rashes. Cardiac: Regular rate and rhythm, no murmurs rubs or gallops, no lower extremity edema.  Respiratory: Clear to auscultation bilaterally. Not using accessory muscles, speaking in full sentences. Right hand: No swelling, no tenderness to palpation, excellent strength to flexion and extension, collaterals are stable. Neurovascularly intact distally.  Impression and Recommendations:

## 2012-10-10 NOTE — Assessment & Plan Note (Signed)
Completely resolved after injection. Return as needed.

## 2013-03-07 ENCOUNTER — Other Ambulatory Visit: Payer: Self-pay

## 2013-04-10 ENCOUNTER — Other Ambulatory Visit: Payer: Self-pay | Admitting: Sports Medicine

## 2013-04-11 ENCOUNTER — Other Ambulatory Visit: Payer: Self-pay

## 2013-04-17 ENCOUNTER — Encounter: Payer: Self-pay | Admitting: Sports Medicine

## 2013-04-17 ENCOUNTER — Ambulatory Visit (INDEPENDENT_AMBULATORY_CARE_PROVIDER_SITE_OTHER): Payer: 59 | Admitting: Sports Medicine

## 2013-04-17 VITALS — BP 126/76 | HR 74 | Wt 142.0 lb

## 2013-04-17 DIAGNOSIS — M76899 Other specified enthesopathies of unspecified lower limb, excluding foot: Secondary | ICD-10-CM

## 2013-04-17 DIAGNOSIS — M7061 Trochanteric bursitis, right hip: Secondary | ICD-10-CM

## 2013-04-17 NOTE — Patient Instructions (Signed)
Hip Rehabilitation Protocol:  1.  Side leg raises.  3x30 with no weight, then 3x15 with 2 lb ankle weight, then 3x15 with 5 lb ankle weight 2.  Standing hip rotation.  3x30 with no weight, then 3x15 with 2 lb ankle weight, then 3x15 with 5 lb ankle weight. 3.  Side step ups.  3x30 with no weight, then 3x15 with 5 lbs in backpack, then 3x15 with 10 lbs in backpack. 

## 2013-04-17 NOTE — Progress Notes (Signed)
  Subjective:    CC: Right hip pain  HPI: This is a pleasant 47 year old female with history of hip abductor tendinitis. Initially we placed her through the hip abductor rehabilitation protocol and she improved significantly earlier this year. Unfortunately she has developed pain she localizes over the right greater trochanter, without radiation, moderate, persistent, keeping her from sleeping. She has not been doing her hip abductor rehabilitation.  Past medical history, Surgical history, Family history not pertinant except as noted below, Social history, Allergies, and medications have been entered into the medical record, reviewed, and no changes needed.   Review of Systems: No fevers, chills, night sweats, weight loss, chest pain, or shortness of breath.   Objective:    General: Well Developed, well nourished, and in no acute distress.  Neuro: Alert and oriented x3, extra-ocular muscles intact, sensation grossly intact.  HEENT: Normocephalic, atraumatic, pupils equal round reactive to light, neck supple, no masses, no lymphadenopathy, thyroid nonpalpable.  Skin: Warm and dry, no rashes. Cardiac: Regular rate and rhythm, no murmurs rubs or gallops, no lower extremity edema.  Respiratory: Clear to auscultation bilaterally. Not using accessory muscles, speaking in full sentences. Right Hip: Excellent motion, poor hip abductor strength on the right side. Pelvic alignment unremarkable to inspection and palpation. Standing hip rotation and gait without trendelenburg sign / unsteadiness. Tender to palpation over the greater trochanter. No pain with FABER or FADIR. No SI joint tenderness and normal minimal SI movement.  Procedure:  Injection of right trochanteric bursa Consent obtained and verified. Time-out conducted. Noted no overlying erythema, induration, or other signs of local infection. Skin prepped in a sterile fashion. Topical analgesic spray: Ethyl chloride. Completed without  difficulty. Meds: Through a spinal needle, 1 cc Kenalog 40, 4 cc lidocaine injected easily in a fanlike pattern over the greater trochanter. Pain immediately improved suggesting accurate placement of the medication. Advised to call if fevers/chills, erythema, induration, drainage, or persistent bleeding.  Impression and Recommendations:

## 2013-04-17 NOTE — Assessment & Plan Note (Addendum)
Injection as above. Aggressive hip abductor rehabilitation. Continue Mobic as needed. Return to see me about 6 weeks.

## 2013-05-22 ENCOUNTER — Telehealth: Payer: Self-pay | Admitting: Internal Medicine

## 2013-05-22 DIAGNOSIS — R102 Pelvic and perineal pain: Secondary | ICD-10-CM

## 2013-05-22 DIAGNOSIS — N939 Abnormal uterine and vaginal bleeding, unspecified: Secondary | ICD-10-CM

## 2013-05-22 NOTE — Telephone Encounter (Signed)
Order placed. Jennifer Castillo, CMA  

## 2013-05-22 NOTE — Addendum Note (Signed)
Addended by: Boone MasterJONES, JESSICA E on: 05/22/2013 05:05 PM   Modules accepted: Orders

## 2013-05-22 NOTE — Telephone Encounter (Signed)
I spoke with the [name] [name] and she states she has been having abnormal vaginal bleeding and lower abdominal pain x 5 days. She states she had her regular cycle, was off x 3 days, then started back and has been bleeding x 5 days now with the pain. She states she called her GYN and was advised that they cannot see her until end of feb so to call PCP to see if they will order ultrasound. [name] [name] gyn is Dr. Marice Potterove so I called the office to verify this and was advised that the first available is end of feb. I was advised that they ask that PCP order ultrasound to "buy them some time" until they can get the [name] [name] in. I asked if the ultrasound is abnormal then what is their policy, advised they would discuss at OV end of Feb. [name] [name] is aware of this as well. Please advise if ok to order ultrasound or refer to another GYN? Carron CurieJennifer Jasmin Trumbull, CMA

## 2013-05-22 NOTE — Telephone Encounter (Signed)
No preference - wherever she wants to have it

## 2013-05-22 NOTE — Telephone Encounter (Signed)
I would call [facility] and see if they will see her but if can't be done right away ok to do u/s first and I will call her gyn or Dr Marcelle OverlieHolland personally if need be

## 2013-05-22 NOTE — Telephone Encounter (Signed)
Pt sees Dr Nicholaus Bloom[place]ra Dove [place]--no longer with Dr Marcelle OverlieHolland Dr Marice Potterove told patient she would not be able to be seen until the end of February. Dr Nicholaus BloomMyra Dove # [phone]  Please advise Dr Sherene SiresWert on order. Thanks.

## 2013-05-22 NOTE — Telephone Encounter (Signed)
Called spoke with patient who reported that she tried her GYN's office again but was told (again) that they are unable to see her until the end of Feb; no NP's/PA's in the office to see work-ins.  Advised pt that MW Dorette Grateokayed for our office to move forward with the abd US.  Pt aware the order has been placed and our office will call her back with the specifics.  Dr Sherene SiresWert please advise of your preferences on the ultrasound.  Thank you.

## 2013-05-22 NOTE — Telephone Encounter (Signed)
I would still ask Dr [facility] office first and if they can't see her this week then go ahead with u/s and I will take it from there

## 2013-05-22 NOTE — Telephone Encounter (Signed)
Per Almyra FreeLibby, [facility] is requiring a transvaginal to accompany the pelvic US.  Order placed.

## 2013-05-27 ENCOUNTER — Other Ambulatory Visit: Payer: 59

## 2013-05-27 ENCOUNTER — Encounter: Payer: Self-pay | Admitting: Internal Medicine

## 2013-05-27 ENCOUNTER — Ambulatory Visit (HOSPITAL_COMMUNITY)
Admission: RE | Admit: 2013-05-27 | Discharge: 2013-05-27 | Disposition: A | Discharge status: Home / Self Care | Payer: 59 | Source: Ambulatory Visit | Attending: Internal Medicine | Admitting: Internal Medicine

## 2013-05-27 ENCOUNTER — Other Ambulatory Visit: Payer: Self-pay | Admitting: Internal Medicine

## 2013-05-27 ENCOUNTER — Ambulatory Visit (HOSPITAL_COMMUNITY): Admission: RE | Admit: 2013-05-27 | Payer: 59 | Source: Ambulatory Visit

## 2013-05-27 DIAGNOSIS — N949 Unspecified condition associated with female genital organs and menstrual cycle: Secondary | ICD-10-CM | POA: Insufficient documentation

## 2013-05-27 DIAGNOSIS — R102 Pelvic and perineal pain: Secondary | ICD-10-CM

## 2013-05-27 DIAGNOSIS — N939 Abnormal uterine and vaginal bleeding, unspecified: Secondary | ICD-10-CM

## 2013-05-27 DIAGNOSIS — N920 Excessive and frequent menstruation with regular cycle: Secondary | ICD-10-CM | POA: Insufficient documentation

## 2013-05-27 DIAGNOSIS — N938 Other specified abnormal uterine and vaginal bleeding: Secondary | ICD-10-CM | POA: Insufficient documentation

## 2013-05-27 DIAGNOSIS — N898 Other specified noninflammatory disorders of vagina: Secondary | ICD-10-CM | POA: Insufficient documentation

## 2013-05-28 NOTE — Progress Notes (Signed)
Quick Note:  Spoke with pt and notified of results per Dr. Sherene SiresWert. Pt verbalized understanding and denied any questions. Copy faxed to Jennings BooksSara Dove, MD ______

## 2013-05-29 ENCOUNTER — Encounter: Payer: Self-pay | Admitting: Sports Medicine

## 2013-05-29 ENCOUNTER — Ambulatory Visit (INDEPENDENT_AMBULATORY_CARE_PROVIDER_SITE_OTHER): Payer: 59 | Admitting: Sports Medicine

## 2013-05-29 VITALS — BP 124/75 | HR 84 | Ht 61.0 in | Wt 140.0 lb

## 2013-05-29 DIAGNOSIS — M76899 Other specified enthesopathies of unspecified lower limb, excluding foot: Secondary | ICD-10-CM

## 2013-05-29 DIAGNOSIS — M7061 Trochanteric bursitis, right hip: Secondary | ICD-10-CM

## 2013-05-29 MED ORDER — TRAMADOL HCL 50 MG PO TABS: ORAL_TABLET | ORAL | Status: DC

## 2013-05-29 NOTE — Progress Notes (Signed)
  Subjective:    CC: Followup  HPI: [name] [name] comes back, she has right-sided trochanteric bursitis. At the last visit she had failed conservative measures so we injected her greater trochanteric bursa. She returns approximately 80% improved however she has not been compliant with her rehabilitation exercises. She still has some weakness in the hip abductor's, however pain is much better. She is also significantly more functional, pain is mild, intermittent.  Past medical history, Surgical history, Family history not pertinant except as noted below, Social history, Allergies, and medications have been entered into the medical record, reviewed, and no changes needed.   Review of Systems: No fevers, chills, night sweats, weight loss, chest pain, or shortness of breath.   Objective:    General: Well Developed, well nourished, and in no acute distress.  Neuro: Alert and oriented x3, extra-ocular muscles intact, sensation grossly intact.  HEENT: Normocephalic, atraumatic, pupils equal round reactive to light, neck supple, no masses, no lymphadenopathy, thyroid nonpalpable.  Skin: Warm and dry, no rashes. Cardiac: Regular rate and rhythm, no murmurs rubs or gallops, no lower extremity edema.  Respiratory: Clear to auscultation bilaterally. Not using accessory muscles, speaking in full sentences. Right hip: Only minimal if any tenderness to palpation over the greater trochanter, hip abductor strength is still weak bilaterally, right worse than left. Internal and external rotation are both excellent.  Impression and Recommendations:

## 2013-05-29 NOTE — Assessment & Plan Note (Signed)
80% improved after injection however she has not been compliant with her exercises. Like to see her back in 2 months after she has been somewhat more compliant. We did test her daughter's hip abductor strength and it was more. Refilling tramadol.

## 2013-06-05 ENCOUNTER — Encounter: Payer: Self-pay | Admitting: Obstetrics & Gynecology

## 2013-06-05 ENCOUNTER — Ambulatory Visit (INDEPENDENT_AMBULATORY_CARE_PROVIDER_SITE_OTHER): Payer: 59 | Admitting: Obstetrics & Gynecology

## 2013-06-05 VITALS — BP 132/89 | HR 84 | Resp 16 | Ht 61.0 in | Wt 138.0 lb

## 2013-06-05 DIAGNOSIS — Z113 Encounter for screening for infections with a predominantly sexual mode of transmission: Secondary | ICD-10-CM

## 2013-06-05 DIAGNOSIS — Z1151 Encounter for screening for human papillomavirus (HPV): Secondary | ICD-10-CM

## 2013-06-05 DIAGNOSIS — Z01419 Encounter for gynecological examination (general) (routine) without abnormal findings: Secondary | ICD-10-CM

## 2013-06-05 DIAGNOSIS — Z124 Encounter for screening for malignant neoplasm of cervix: Secondary | ICD-10-CM

## 2013-06-05 DIAGNOSIS — Z0000 Encounter for general adult medical examination without abnormal findings: Secondary | ICD-10-CM

## 2013-06-05 MED ORDER — MEDROXYPROGESTERONE ACETATE 10 MG PO TABS: 10.0000 mg | ORAL_TABLET | Freq: Every day | ORAL | Status: DC

## 2013-06-05 NOTE — Progress Notes (Signed)
Subjective:    [name] [name] is a 48 y.o. female who presents for an annual exam. She reports she has bled for almost the entire last 30 days. Always normal prior to this occasion of DUB. U/S was normal. She has not checked TSH or CBC. The patient is sexually active. GYN screening history: last pap: was normal. The patient wears seatbelts: yes. The patient participates in regular exercise: yes. Has the patient ever been transfused or tattooed?: no. The patient reports that there is not domestic violence in her life.   Menstrual History: OB History   Grav Para Term Preterm Abortions TAB SAB Ect Mult Living   2 2              Menarche age: [age] [place]: [age]   Patient's last menstrual period was 05/16/2013.    The following portions of the patient's history were reviewed and updated as appropriate: allergies, current medications, past family history, past medical history, past social history, past surgical history and problem list.  Review of Systems A comprehensive review of systems was negative. 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 Married for 23 years, husband had affair in 2014.   Objective:    BP 132/89  Pulse 84  Resp 16  Ht 5\' 1"  (1.549 m)  Wt 138 lb (62.596 kg)  BMI 26.09 kg/m2  LMP 05/16/2013  General Appearance:    Alert, cooperative, no distress, appears stated age  Head:    Normocephalic, without obvious abnormality, atraumatic  Eyes:    PERRL, conjunctiva/corneas clear, EOM's intact, fundi    benign, both eyes  Ears:    Normal TM's and external ear canals, both ears  Nose:   Nares normal, septum midline, mucosa normal, no drainage    or sinus tenderness  Throat:   Lips, mucosa, and tongue normal; teeth and gums normal  Neck:   Supple, symmetrical, trachea midline, no adenopathy;    thyroid:  no enlargement/tenderness/nodules; no carotid   bruit or JVD  Back:     Symmetric, no curvature, ROM normal, no CVA tenderness  Lungs:     Clear to auscultation bilaterally,  respirations unlabored  Chest Wall:    No tenderness or deformity   Heart:    Regular rate and rhythm, S1 and S2 normal, no murmur, rub   or gallop  Breast Exam:    No tenderness, masses, or nipple abnormality  Abdomen:     Soft, non-tender, bowel sounds active all four quadrants,    no masses, no organomegaly  Genitalia:    Normal female without lesion, discharge or tenderness, shaved, ULN size, NT, mobile, normal adnexal exam Moderate amount of blood in vault     Extremities:   Extremities normal, atraumatic, no cyanosis or edema  Pulses:   2+ and symmetric all extremities  Skin:   Skin color, texture, turgor normal, no rashes or lesions  Lymph nodes:   Cervical, supraclavicular, and axillary nodes normal  Neurologic:   CNII-XII intact, normal strength, sensation and reflexes    throughout  .    Assessment:    Healthy female exam.  DUB   Plan:     Breast self exam technique reviewed and patient encouraged to perform self-exam monthly. Chlamydia specimen. GC specimen. Mammogram. Thin prep Pap smear. with cotesting STI testing. Declines HSV2 Check TSH, CBC, trial of provera 10 days cyclicly for 3 month Routine labs

## 2013-06-05 NOTE — Patient Instructions (Signed)

## 2013-06-06 ENCOUNTER — Telehealth: Payer: Self-pay | Admitting: *Deleted

## 2013-06-06 LAB — COMPREHENSIVE METABOLIC PANEL
ALK PHOS: 51 U/L (ref 39–117)
ALT: 11 U/L (ref 0–35)
AST: 12 U/L (ref 0–37)
Albumin: 4.4 g/dL (ref 3.5–5.2)
BUN: 9 mg/dL (ref 6–23)
CALCIUM: 9.1 mg/dL (ref 8.4–10.5)
CHLORIDE: 103 meq/L (ref 96–112)
CO2: 25 mEq/L (ref 19–32)
CREATININE: 0.66 mg/dL (ref 0.50–1.10)
Glucose, Bld: 82 mg/dL (ref 70–99)
Potassium: 4.1 mEq/L (ref 3.5–5.3)
Sodium: 138 mEq/L (ref 135–145)
Total Bilirubin: 0.4 mg/dL (ref 0.2–1.2)
Total Protein: 6.5 g/dL (ref 6.0–8.3)

## 2013-06-06 LAB — HEPATITIS B SURFACE ANTIGEN: Hepatitis B Surface Ag: NEGATIVE

## 2013-06-06 LAB — CBC
HEMATOCRIT: 41.2 % (ref 36.0–46.0)
Hemoglobin: 14.2 g/dL (ref 12.0–15.0)
MCH: 32 pg (ref 26.0–34.0)
MCHC: 34.5 g/dL (ref 30.0–36.0)
MCV: 92.8 fL (ref 78.0–100.0)
PLATELETS: 232 10*3/uL (ref 150–400)
RBC: 4.44 MIL/uL (ref 3.87–5.11)
RDW: 14.8 % (ref 11.5–15.5)
WBC: 8.4 10*3/uL (ref 4.0–10.5)

## 2013-06-06 LAB — TSH: TSH: 1.625 u[IU]/mL (ref 0.350–4.500)

## 2013-06-06 LAB — LIPID PANEL
CHOL/HDL RATIO: 4.1 ratio
CHOLESTEROL: 206 mg/dL — AB (ref 0–200)
HDL: 50 mg/dL (ref >39)
LDL CALC: 143 mg/dL — AB (ref 0–99)
TRIGLYCERIDES: 67 mg/dL (ref <150)
VLDL: 13 mg/dL (ref 0–40)

## 2013-06-06 LAB — HIV ANTIBODY (ROUTINE TESTING W REFLEX): HIV: NONREACTIVE

## 2013-06-06 LAB — HEPATITIS C ANTIBODY: HCV AB: NEGATIVE

## 2013-06-06 LAB — RPR

## 2013-06-06 NOTE — Telephone Encounter (Signed)
Lm on voicemail to call office about lab results.  Copy of lab mailed to pt home address.

## 2013-06-07 ENCOUNTER — Ambulatory Visit (HOSPITAL_COMMUNITY)
Admission: RE | Admit: 2013-06-07 | Discharge: 2013-06-07 | Disposition: A | Discharge status: Home / Self Care | Payer: 59 | Source: Ambulatory Visit | Attending: Obstetrics & Gynecology | Admitting: Obstetrics & Gynecology

## 2013-06-07 DIAGNOSIS — Z1231 Encounter for screening mammogram for malignant neoplasm of breast: Secondary | ICD-10-CM | POA: Insufficient documentation

## 2013-06-07 DIAGNOSIS — Z0000 Encounter for general adult medical examination without abnormal findings: Secondary | ICD-10-CM

## 2013-06-13 ENCOUNTER — Other Ambulatory Visit: Payer: Self-pay | Admitting: Obstetrics & Gynecology

## 2013-06-13 DIAGNOSIS — R928 Other abnormal and inconclusive findings on diagnostic imaging of breast: Secondary | ICD-10-CM

## 2013-06-20 ENCOUNTER — Ambulatory Visit
Admission: RE | Admit: 2013-06-20 | Discharge: 2013-06-20 | Disposition: A | Discharge status: Home / Self Care | Payer: 59 | Source: Ambulatory Visit | Attending: Obstetrics & Gynecology | Admitting: Obstetrics & Gynecology

## 2013-06-20 DIAGNOSIS — R928 Other abnormal and inconclusive findings on diagnostic imaging of breast: Secondary | ICD-10-CM

## 2013-07-29 ENCOUNTER — Ambulatory Visit: Payer: 59 | Admitting: Sports Medicine

## 2013-12-09 ENCOUNTER — Encounter: Payer: Self-pay | Admitting: Sports Medicine

## 2013-12-09 ENCOUNTER — Other Ambulatory Visit: Payer: Self-pay | Admitting: Sports Medicine

## 2013-12-10 MED ORDER — MELOXICAM 15 MG PO TABS: ORAL_TABLET | ORAL | Status: DC

## 2014-03-03 ENCOUNTER — Encounter: Payer: Self-pay | Admitting: Obstetrics & Gynecology

## 2014-03-10 ENCOUNTER — Encounter: Payer: Self-pay | Admitting: Internal Medicine

## 2014-11-06 ENCOUNTER — Encounter: Payer: Self-pay | Admitting: *Deleted

## 2014-11-06 ENCOUNTER — Emergency Department
Admission: EM | Admit: 2014-11-06 | Discharge: 2014-11-06 | Disposition: A | Discharge status: Home / Self Care | Payer: 59 | Source: Home / Self Care | Attending: Family Medicine | Admitting: Family Medicine

## 2014-11-06 DIAGNOSIS — L089 Local infection of the skin and subcutaneous tissue, unspecified: Secondary | ICD-10-CM | POA: Diagnosis not present

## 2014-11-06 DIAGNOSIS — T148 Other injury of unspecified body region: Secondary | ICD-10-CM

## 2014-11-06 DIAGNOSIS — W57XXXA Bitten or stung by nonvenomous insect and other nonvenomous arthropods, initial encounter: Secondary | ICD-10-CM

## 2014-11-06 MED ORDER — DOXYCYCLINE HYCLATE 100 MG PO CAPS: 100.0000 mg | ORAL_CAPSULE | Freq: Two times a day (BID) | ORAL | Status: DC

## 2014-11-06 NOTE — ED Notes (Signed)
Pt c/o possible bug bit to right inner thigh x 3 days. Site had a blister center.

## 2014-11-06 NOTE — ED Provider Notes (Signed)
CSN: [id number]     Arrival date & time 11/06/14  1114 History   First MD Initiated Contact with Patient 11/06/14 1230     Chief Complaint  Patient presents with  . Insect Bite   (Consider location/radiation/quality/duration/timing/severity/associated sxs/prior Treatment) HPI Patient is a 49 year old female presenting to urgent care with complaint of three-day history possible bug bite on right inner thigh. States she woke up with a red area she was concern for possible bite. States it initially had a blister in the center been trained blood and small amount of yellow fluid. Area is mildly pruritic in nature she has tried topical Benadryl with minimal relief. No other medications tried prior to arrival. States [place] and has pets that are inside and outdoor animals. Reports subjective fever with chills and mild nausea but no vomiting or diarrhea. Denies muscle aches or pains. Denies known tick bite however does not know what bit her. Denies no other significant past medical history.  Past Medical History  Diagnosis Date  . GERD (gastroesophageal reflux disease)   . Asthma   . IBS (irritable bowel syndrome)   . Murmur   . Hydronephrosis, right     chronic   Past Surgical History  Procedure Laterality Date  . Tonsillectomy    . Hand surgery      LT 2002  . Pyeloplasty      RT kidney 1993  . Pyleoplasty      right kidney   Family History  Problem Relation Age of Onset  . Alcohol abuse Father   . Depression Father   . Stroke Father   . Hypertension Father   . Heart attack Father   . Esophageal cancer Father   . Breast cancer Mother     metastatic to lungs  . Heart disease Mother   . Hypertension Mother   . Cancer Mother     breast and lung  . Heart attack Mother   . Mental illness Paternal Aunt    History  Substance Use Topics  . Smoking status: Never Smoker   . Smokeless tobacco: Never Used  . Alcohol Use: No   OB History    Gravida Para Term  Preterm AB TAB SAB Ectopic Multiple Living   2 2             Review of Systems  Constitutional: Positive for fever (subjective) and chills. Negative for diaphoresis, appetite change and fatigue.  Respiratory: Negative for cough and shortness of breath.   Cardiovascular: Negative for chest pain, palpitations and leg swelling.  Gastrointestinal: Positive for nausea. Negative for vomiting, abdominal pain, diarrhea and constipation.  Musculoskeletal: Negative for myalgias and arthralgias.  Skin: Positive for rash and wound.    Allergies  Sulfonamide derivatives  Home Medications   Prior to Admission medications   Medication Sig Start Date End Date Taking? Authorizing Provider  doxycycline (VIBRAMYCIN) 100 MG capsule Take 1 capsule (100 mg total) by mouth 2 (two) times daily. One po bid x 7 days 11/06/14   Junius FinnerErin O'Malley, PA-C  hyoscyamine (LEVSIN SL) 0.125 MG SL tablet Place 0.125 mg under the tongue 3 (three) times daily as needed. 10/07/11   Tammy S Parrett, NP  HYOSCYAMINE PO Take 1 tablet by mouth 3 (three) times daily as needed.      Historical Provider, MD  loratadine (CLARITIN) 10 MG tablet Take 10 mg by mouth daily.      Historical Provider, MD  medroxyPROGESTERone (PROVERA) 10  MG tablet Take 1 tablet (10 mg total) by mouth daily. Use for ten days 06/05/13   Allie Bossier, MD  meloxicam (MOBIC) 15 MG tablet One tab PO qAM with breakfast. 12/10/13   Monica Becton, MD  mometasone-formoterol [facility]) 100-5 MCG/ACT AERO 2 puffs every 12 hours 12/27/10   Nyoka Cowden, MD   BP 132/92 mmHg  Pulse 82  Temp(Src) 98.9 F (37.2 C) (Oral)  Resp 14  Wt 144 lb (65.318 kg)  SpO2 98%  LMP 10/29/2014 Physical Exam  Constitutional: She appears well-developed and well-nourished. No distress.  HENT:  Head: Normocephalic and atraumatic.  Eyes: Conjunctivae are normal. No scleral icterus.  Neck: Normal range of motion.  Cardiovascular: Normal rate, regular rhythm and normal heart sounds.    Pulmonary/Chest: Effort normal and breath sounds normal. No respiratory distress. She has no wheezes. She has no rales. She exhibits no tenderness.  Abdominal: Soft. Bowel sounds are normal. She exhibits no distension and no mass. There is no tenderness. There is no rebound and no guarding.  Musculoskeletal: Normal range of motion.  Neurological: She is alert.  Skin: Skin is warm and dry. Rash noted. She is not diaphoretic. There is erythema.  2cm area of erythematous maculopapular lesion on Right middle-posterior thigh. No induration or fluctuance. No bleeding or discharge. Non-tender. No foreign bodies seen or palpated.   Nursing note and vitals reviewed.   ED Course  Procedures (including critical care time) Labs Review Labs Reviewed - No data to display  Imaging Review No results found.   MDM   1. Infected insect bite     Patient is a 49 year old female complaining of a small insect bite to right inner thigh. Patient has had subjective fever with chills and nausea. Patient appears well nontoxic is afebrile on exam. Exam consistent with cellulitis without abscess. Will start doxycycline for infected insect bite. Home care instructions provided. Advise follow-up with PCP in one week. Return precautions provided. Pt verbalized understanding and agreement with tx plan.     Junius Finner, PA-C 11/06/14 1308

## 2014-12-14 ENCOUNTER — Encounter (HOSPITAL_BASED_OUTPATIENT_CLINIC_OR_DEPARTMENT_OTHER): Payer: Self-pay | Admitting: Emergency Medicine

## 2014-12-14 ENCOUNTER — Emergency Department (HOSPITAL_BASED_OUTPATIENT_CLINIC_OR_DEPARTMENT_OTHER): Payer: 59

## 2014-12-14 ENCOUNTER — Emergency Department (HOSPITAL_BASED_OUTPATIENT_CLINIC_OR_DEPARTMENT_OTHER)
Admission: EM | Admit: 2014-12-14 | Discharge: 2014-12-14 | Disposition: A | Discharge status: Home / Self Care | Payer: 59 | Attending: Emergency Medicine | Admitting: Emergency Medicine

## 2014-12-14 DIAGNOSIS — M7989 Other specified soft tissue disorders: Secondary | ICD-10-CM | POA: Insufficient documentation

## 2014-12-14 DIAGNOSIS — K589 Irritable bowel syndrome without diarrhea: Secondary | ICD-10-CM | POA: Insufficient documentation

## 2014-12-14 DIAGNOSIS — R011 Cardiac murmur, unspecified: Secondary | ICD-10-CM | POA: Diagnosis not present

## 2014-12-14 DIAGNOSIS — R0602 Shortness of breath: Secondary | ICD-10-CM

## 2014-12-14 DIAGNOSIS — Y9389 Activity, other specified: Secondary | ICD-10-CM | POA: Diagnosis not present

## 2014-12-14 DIAGNOSIS — J45901 Unspecified asthma with (acute) exacerbation: Secondary | ICD-10-CM | POA: Diagnosis not present

## 2014-12-14 DIAGNOSIS — Z79899 Other long term (current) drug therapy: Secondary | ICD-10-CM | POA: Insufficient documentation

## 2014-12-14 DIAGNOSIS — S20221A Contusion of right back wall of thorax, initial encounter: Secondary | ICD-10-CM | POA: Insufficient documentation

## 2014-12-14 DIAGNOSIS — Y92410 Unspecified street and highway as the place of occurrence of the external cause: Secondary | ICD-10-CM | POA: Insufficient documentation

## 2014-12-14 DIAGNOSIS — Y998 Other external cause status: Secondary | ICD-10-CM | POA: Insufficient documentation

## 2014-12-14 DIAGNOSIS — S161XXA Strain of muscle, fascia and tendon at neck level, initial encounter: Secondary | ICD-10-CM | POA: Insufficient documentation

## 2014-12-14 DIAGNOSIS — Z87448 Personal history of other diseases of urinary system: Secondary | ICD-10-CM | POA: Insufficient documentation

## 2014-12-14 LAB — CBC WITH DIFFERENTIAL/PLATELET
Basophils Absolute: 0 10*3/uL (ref 0.0–0.1)
Basophils Relative: 1 % (ref 0–1)
Eosinophils Absolute: 0.7 10*3/uL (ref 0.0–0.7)
Eosinophils Relative: 9 % — ABNORMAL HIGH (ref 0–5)
HCT: 41.3 % (ref 36.0–46.0)
HEMOGLOBIN: 13.5 g/dL (ref 12.0–15.0)
Lymphocytes Relative: 23 % (ref 12–46)
Lymphs Abs: 1.8 10*3/uL (ref 0.7–4.0)
MCH: 30.7 pg (ref 26.0–34.0)
MCHC: 32.7 g/dL (ref 30.0–36.0)
MCV: 93.9 fL (ref 78.0–100.0)
MONO ABS: 0.9 10*3/uL (ref 0.1–1.0)
Monocytes Relative: 12 % (ref 3–12)
Neutro Abs: 4.3 10*3/uL (ref 1.7–7.7)
Neutrophils Relative %: 55 % (ref 43–77)
PLATELETS: 206 10*3/uL (ref 150–400)
RBC: 4.4 MIL/uL (ref 3.87–5.11)
RDW: 13.9 % (ref 11.5–15.5)
WBC: 7.7 10*3/uL (ref 4.0–10.5)

## 2014-12-14 LAB — BASIC METABOLIC PANEL
Anion gap: 9 (ref 5–15)
BUN: 11 mg/dL (ref 6–20)
CO2: 25 mmol/L (ref 22–32)
Calcium: 9 mg/dL (ref 8.9–10.3)
Chloride: 105 mmol/L (ref 101–111)
Creatinine, Ser: 0.7 mg/dL (ref 0.44–1.00)
GFR calc Af Amer: >60 mL/min (ref >60)
GLUCOSE: 101 mg/dL — AB (ref 65–99)
Potassium: 3.6 mmol/L (ref 3.5–5.1)
Sodium: 139 mmol/L (ref 135–145)

## 2014-12-14 LAB — TROPONIN I: Troponin I: 0.03 ng/mL (ref <0.031)

## 2014-12-14 MED ORDER — CYCLOBENZAPRINE HCL 10 MG PO TABS: 10.0000 mg | ORAL_TABLET | Freq: Two times a day (BID) | ORAL | Status: DC | PRN

## 2014-12-14 MED ORDER — IOHEXOL 350 MG/ML SOLN
100.0000 mL | Freq: Once | INTRAVENOUS | Status: AC | PRN
  Administered 2014-12-14: 100 mL via INTRAVENOUS

## 2014-12-14 NOTE — ED Notes (Signed)
Took albuterl at home with no change. Recent flying travel across country.

## 2014-12-14 NOTE — ED Notes (Signed)
Pt complains of Shortness of breath when talking. Hx of asthma.

## 2014-12-14 NOTE — ED Notes (Signed)
RT assessed patient upon arriving to the room. Patient stated she is SOB with exertion and talking, said it started a bit ago. Stated it does not feel like asthma, but she took her MDI. BBS clear. Patient getting undressed.

## 2014-12-14 NOTE — Discharge Instructions (Signed)
Shortness of Breath °Shortness of breath means you have trouble breathing. Shortness of breath needs medical care right away. °HOME CARE  °· Do not smoke. °· Avoid being around chemicals or things (paint fumes, dust) that may bother your breathing. °· Rest as needed. Slowly begin your normal activities. °· Only take medicines as told by your doctor. °· Keep all doctor visits as told. °GET HELP RIGHT AWAY IF:  °· Your shortness of breath gets worse. °· You feel lightheaded, pass out (faint), or have a cough that is not helped by medicine. °· You cough up blood. °· You have pain with breathing. °· You have pain in your chest, arms, shoulders, or belly (abdomen). °· You have a fever. °· You cannot walk up stairs or exercise the way you normally do. °· You do not get better in the time expected. °· You have a hard time doing normal activities even with rest. °· You have problems with your medicines. °· You have any new symptoms. °MAKE SURE YOU: °· Understand these instructions. °· Will watch your condition. °· Will get help right away if you are not doing well or get worse. °Document Released: 10/05/2007 Document Revised: 04/23/2013 Document Reviewed: 07/04/2011 °ExitCare® Patient Information ©2015 [employer]. This information is not intended to replace advice given to you by your health care provider. Make sure you discuss any questions you have with your health care provider. ° °

## 2014-12-14 NOTE — ED Provider Notes (Addendum)
CSN: [id number]     Arrival date & time 12/14/14  1647 History  This chart was scribed for Nelva Nay, MD by Placido Sou, ED scribe. This patient was seen in room MH06/MH06 and the patient's care was started at 5:06 PM.   Chief Complaint  Patient presents with  . Shortness of Breath   The history is provided by the patient. No language interpreter was used.    HPI Comments: [name] [name] is a 49 y.o. female who presents to the Emergency Department complaining of intermittent, mild, SOB with onset 1.5 hours ago. Pt notes got home from a cruise earlier this afternoon and began feeling SOB and further notes that it begins and then worsens when speaking. Pt notes a hx of asthma but denies this feeling is similar and further notes using her prescribed inhaler PTA which provided no relief of her symptoms.  Pt notes a recent fall on her vacation with associated bruising to her buttocks and further notes that 2 days ago she was also experiencing moderate edema to her right leg which has somewhat alleviated. She denies a hx of blood clots. Pt denies coughing up blood or any recent stressful events.   PCP: Dr. [name]  Past Medical History  Diagnosis Date  . GERD (gastroesophageal reflux disease)   . Asthma   . IBS (irritable bowel syndrome)   . Murmur   . Hydronephrosis, right     chronic   Past Surgical History  Procedure Laterality Date  . Tonsillectomy    . Hand surgery      LT 2002  . Pyeloplasty      RT kidney 1993  . Pyleoplasty      right kidney   Family History  Problem Relation Age of Onset  . Alcohol abuse Father   . Depression Father   . Stroke Father   . Hypertension Father   . Heart attack Father   . Esophageal cancer Father   . Breast cancer Mother     metastatic to lungs  . Heart disease Mother   . Hypertension Mother   . Cancer Mother     breast and lung  . Heart attack Mother   . Mental illness Paternal Aunt    Social History  Substance Use Topics  .  Smoking status: Never Smoker   . Smokeless tobacco: Never Used  . Alcohol Use: No   OB History    Gravida Para Term Preterm AB TAB SAB Ectopic Multiple Living   2 2             Review of Systems  Respiratory: Positive for shortness of breath.   Cardiovascular: Positive for leg swelling.  All other systems reviewed and are negative.  Allergies  Sulfonamide derivatives  Home Medications   Prior to Admission medications   Medication Sig Start Date End Date Taking? Authorizing Provider  doxycycline (VIBRAMYCIN) 100 MG capsule Take 1 capsule (100 mg total) by mouth 2 (two) times daily. One po bid x 7 days 11/06/14   Junius Finner, PA-C  hyoscyamine (LEVSIN SL) 0.125 MG SL tablet Place 0.125 mg under the tongue 3 (three) times daily as needed. 10/07/11   Tammy S Parrett, NP  HYOSCYAMINE PO Take 1 tablet by mouth 3 (three) times daily as needed.      Historical Provider, MD  loratadine (CLARITIN) 10 MG tablet Take 10 mg by mouth daily.      Historical Provider, MD  medroxyPROGESTERone (PROVERA) 10 MG tablet Take  1 tablet (10 mg total) by mouth daily. Use for ten days 06/05/13   Allie Bossier, MD  meloxicam (MOBIC) 15 MG tablet One tab PO qAM with breakfast. Patient taking differently: 15 mg as needed. One tab PO qAM with breakfast. 12/10/13   Monica Becton, MD  mometasone-formoterol [facility]) 100-5 MCG/ACT AERO 2 puffs every 12 hours Patient taking differently: 2 puffs as needed. 2 puffs every 12 hours 12/27/10   Nyoka Cowden, MD   BP 151/89 mmHg  Pulse 92  Temp(Src) 98.4 F (36.9 C) (Oral)  Resp 12  Ht 5\' 1"  (1.549 m)  Wt 145 lb (65.772 kg)  BMI 27.41 kg/m2  SpO2 98%  LMP 11/13/2014 Physical Exam  Constitutional: She is oriented to person, place, and time. She appears well-developed and well-nourished. No distress.  HENT:  Head: Normocephalic and atraumatic.  Eyes: Pupils are equal, round, and reactive to light.  Neck: Normal range of motion.  Cardiovascular: Normal rate and  intact distal pulses.   Pulmonary/Chest: No respiratory distress. She has no wheezes. She has no rales.  Abdominal: Normal appearance. She exhibits no distension.  Musculoskeletal: Normal range of motion.       Back:  Large bruise to right buttock area.  Neurological: She is alert and oriented to person, place, and time. No cranial nerve deficit.  Skin: Skin is warm and dry. No rash noted.  Psychiatric: She has a normal mood and affect. Her behavior is normal.  Nursing note and vitals reviewed.   ED Course  Procedures  DIAGNOSTIC STUDIES: Oxygen Saturation is 96% on RA, normal by my interpretation.    COORDINATION OF CARE: 5:14 PM Discussed treatment plan with pt at bedside and pt agreed to plan.  Labs Review Labs Reviewed  BASIC METABOLIC PANEL - Abnormal; Notable for the following:    Glucose, Bld 101 (*)    All other components within normal limits  CBC WITH DIFFERENTIAL/PLATELET - Abnormal; Notable for the following:    Eosinophils Relative 9 (*)    All other components within normal limits  TROPONIN I    Imaging Review Dg Chest 2 View  12/14/2014   CLINICAL DATA:  Shortness of breath. Nonproductive cough for 2 days.  EXAM: CHEST - 2 VIEW  COMPARISON:  CTA chest 12/14/2014.  FINDINGS: The heart size is normal. The lung volumes are low. The visualized soft tissues and bony thorax are unremarkable.  IMPRESSION: Negative two view chest x-ray   Electronically Signed   By: Marin Roberts M.D.   On: 12/14/2014 18:56   Ct Angio Chest Pe W/cm &/or Wo Cm  12/14/2014   CLINICAL DATA:  49 year old female with chest pain and shortness of Breath. Recent fall. Initial encounter.  EXAM: CT ANGIOGRAPHY CHEST WITH CONTRAST  TECHNIQUE: Multidetector CT imaging of the chest was performed using the standard protocol during bolus administration of intravenous contrast. Multiplanar CT image reconstructions and MIPs were obtained to evaluate the vascular anatomy.  CONTRAST:  OMNIPAQUE  IOHEXOL 350 MG/ML SOLN  COMPARISON:  None.  FINDINGS: Good contrast bolus timing in the pulmonary arterial tree.  No focal filling defect identified in the pulmonary arterial tree to suggest the presence of acute pulmonary embolism.  Major airways are patent. Minor dependent pulmonary atelectasis in both lungs. Otherwise the lungs are clear.  No pericardial or pleural effusion. Negative visualized aorta. Negative thoracic inlet. No mediastinal or axillary lymphadenopathy.  Negative visualized liver, gallbladder, spleen, pancreas, adrenal glands, kidneys, and bowel in the  upper abdomen.  No acute osseous abnormality identified.  Review of the MIP images confirms the above findings.  IMPRESSION: 1.  No evidence of acute pulmonary embolus. 2. Negative chest CTA.   Electronically Signed   By: Odessa Fleming M.D.   On: 12/14/2014 18:50   I, Nelva Nay, MD, personally reviewed and evaluated these images and lab results as part of my medical decision-making.   EKG Interpretation   Date/Time:  Sunday December 14 2014 17:23:02 EDT Ventricular Rate:  87 PR Interval:  128 QRS Duration: 84 QT Interval:  390 QTC Calculation: 469 R Axis:   24 Text Interpretation:  Normal sinus rhythm Normal ECG Confirmed by Albion Weatherholtz   MD, Tomiko Schoon ([id number]) on 12/14/2014 5:22:44 PM      MDM   Final diagnoses:  Shortness of breath    I personally performed the services described in this documentation, which was scribed in my presence. The recorded information has been reviewed and considered.     Nelva Nay, MD 12/14/14 1726  Nelva Nay, MD 12/14/14 [phone]

## 2015-01-14 ENCOUNTER — Encounter: Payer: Self-pay | Admitting: Internal Medicine

## 2015-01-14 ENCOUNTER — Ambulatory Visit (INDEPENDENT_AMBULATORY_CARE_PROVIDER_SITE_OTHER): Payer: 59 | Admitting: Internal Medicine

## 2015-01-14 VITALS — BP 140/90 | HR 77 | Ht 61.0 in | Wt 145.0 lb

## 2015-01-14 DIAGNOSIS — R058 Other specified cough: Secondary | ICD-10-CM

## 2015-01-14 DIAGNOSIS — R05 Cough: Secondary | ICD-10-CM | POA: Diagnosis not present

## 2015-01-14 DIAGNOSIS — I10 Essential (primary) hypertension: Secondary | ICD-10-CM

## 2015-01-14 HISTORY — DX: Essential (primary) hypertension: I10

## 2015-01-14 MED ORDER — FAMOTIDINE 20 MG PO TABS: ORAL_TABLET | ORAL | Status: DC

## 2015-01-14 MED ORDER — HYDROCODONE-HOMATROPINE 5-1.5 MG/5ML PO SYRP: 5.0000 mL | ORAL_SOLUTION | Freq: Four times a day (QID) | ORAL | Status: DC | PRN

## 2015-01-14 MED ORDER — PANTOPRAZOLE SODIUM 40 MG PO TBEC: 40.0000 mg | DELAYED_RELEASE_TABLET | Freq: Every day | ORAL | Status: DC

## 2015-01-14 MED ORDER — NEBIVOLOL HCL 5 MG PO TABS: 5.0000 mg | ORAL_TABLET | Freq: Every day | ORAL | Status: DC

## 2015-01-14 NOTE — Progress Notes (Signed)
Subjective:     Patient ID: [name] [name], female   DOB: [birth year], 49 y.o.   MRN: [id number]  HPI 5  yowf rn, never smoker  with known hx of Allergies and intermittent asthma.   12/27/10/u ov/[name] [name] cc seasonal itching sneezing then onset of wheezing with dusting and vacuuming x 6 months better when not cleaning and not needing any form of rescue or maint rx then x 24 hours acutely much worse, used daughter's albuterol benefit x 5 hours only.   Spring = fall needs clariton prn. Minimal dry cough >>Dulera rx   10/07/2011 Acute OV  Complains of loose stools, abd cramps , low appetite and severe stress.  Husband of 22 years left her 1 week ago with no warning. They have 2 teenage daughters and  she works nights at hospital . Has had to get friends to help stay with girls overnight.  She went to counseling this week .  Feels upset.  No thoughts of suicidal/homicidal ideation.  Feels sad and anxious at times.  No fever ,bloody stools , rash, chest pain or dyspena.  Has strong friend and work support  rec dulera 100 Take 2 puffs first thing in am and then another 2 puffs about 12 hours later as needed   01/14/2015 acute ov/[name] [name] re: cough/ hbp Chief Complaint  Patient presents with  . Acute Visit    Pt c/o increased BP for the past few days. She also c/o dull HA for the past month.    started coughing mid august p flight home from cruise in August 2016 and no better p albuterol and sob > to ER with neg CTa  12/14/14. Breathing got better but cough persisted worse p supper nightly  Ha pattern is pm and not worse in am , gen band like s viz changes   No obvious day to day or daytime variability or assoc sob  cp or chest tightness, subjective wheeze or overt sinus or hb symptoms. No unusual exp hx or h/o childhood pna/ asthma or knowledge of premature birth.  Sleeping ok without nocturnal  or early am exacerbation  of respiratory  c/o's or need for noct saba. Also denies any obvious fluctuation  of symptoms with weather or environmental changes or other aggravating or alleviating factors except as outlined above   Current Medications, Allergies, Complete Past Medical History, Past Surgical History, Family History, and Social History were reviewed in [employer] record.  ROS  The following are not active complaints unless bolded sore throat, dysphagia, dental problems, itching, sneezing,  nasal congestion or excess/ purulent secretions, ear ache,   fever, chills, sweats, unintended wt loss, classically pleuritic or exertional cp, hemoptysis,  orthopnea pnd or leg swelling, presyncope, palpitations, abdominal pain, anorexia, nausea, vomiting, diarrhea  or change in bowel or bladder habits, change in stools or urine, dysuria,hematuria,  rash, arthralgias, visual complaints, headache, numbness, weakness or ataxia or problems with walking or coordination,  change in mood/affect or memory.               Past Medical History:    G2 P2--  GERD  Asthma   .  Past Surgical History:   Left middle finger sagital band repair, R Pyloplasty `93, T&A `84   Family History:   alcoholism-father, MI, Depression, stroke, HTN, chol-mother  Family History of Esophageal Cancer:Father  Family History of Breast Cancer:Mother  Family History of Colon Cancer:Maternal First Cousin   Social History:   Charity fundraiser at [employer]. 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 Never smoked, rare EtOH, no drugs, 3 caffeinated drinks daily. Walk for 30 min 3-4 days/wk               Objective:   Physical Exam     GEN: A/Ox3; pleasant , amb wf nad   Wt Readings from Last 3 Encounters:  01/14/15 65.772 kg (145 lb)  12/14/14 65.772 kg (145 lb)  11/06/14 65.318 kg (144 lb)    Vital signs reviewed   HEENT: [place]/AT, , EACs-clear, TMs-wnl, NOSE-clear, THROAT-clear  NECK: Supple w/ fair ROM; no JVD; normal carotid impulses w/o bruits; no thyromegaly or nodules palpated; no lymphadenopathy.   RESP clear to A and P,  No cough on insp or exp maneuvers  CARD: RRR, no m/r/g  GI: Soft & nt; nml bowel sounds; no organmegaly, no guarding or rebound.  Musco: Warm bil, no calf tenderness edema, clubbing, pulses intact   .   Assessment:

## 2015-01-14 NOTE — Patient Instructions (Signed)
Start bystolic 5 mg daily   Pantoprazole (protonix) 40 mg   Take  30-60 min before first meal of the day and Pepcid (famotidine)  20 mg one @  bedtime until return to office - this is the best way to tell whether stomach acid is contributing to your problem.    GERD (REFLUX)  is an extremely common cause of respiratory symptoms just like yours , many times with no obvious heartburn at all.    It can be treated with medication, but also with lifestyle changes including elevation of the head of your bed (ideally with 6 inch  bed blocks),  Smoking cessation, avoidance of late meals, excessive alcohol, and avoid fatty foods, chocolate, peppermint, colas, red wine, and acidic juices such as orange juice.  NO MINT OR MENTHOL PRODUCTS SO NO COUGH DROPS  USE SUGARLESS CANDY INSTEAD (Jolley ranchers or Stover's or Life Savers) or even ice chips will also do - the key is to swallow to prevent all throat clearing. NO OIL BASED VITAMINS - use powdered substitutes.  For cough take hycodan up to 2 tsp every 4 hours if needed    Please schedule a follow up office visit in 2 weeks, sooner if needed

## 2015-01-18 ENCOUNTER — Encounter: Payer: Self-pay | Admitting: Internal Medicine

## 2015-01-18 DIAGNOSIS — R05 Cough: Secondary | ICD-10-CM | POA: Insufficient documentation

## 2015-01-18 DIAGNOSIS — R058 Other specified cough: Secondary | ICD-10-CM | POA: Insufficient documentation

## 2015-01-18 NOTE — Assessment & Plan Note (Signed)
The most common causes of chronic cough in immunocompetent adults include the following: upper airway cough syndrome (UACS), previously referred to as postnasal drip syndrome (PNDS), which is caused by variety of rhinosinus conditions; (2) asthma; (3) GERD; (4) chronic bronchitis from cigarette smoking or other inhaled environmental irritants; (5) nonasthmatic eosinophilic bronchitis; and (6) bronchiectasis.   These conditions, singly or in combination, have accounted for up to 94% of the causes of chronic cough in prospective studies.   Other conditions have constituted no >6% of the causes in prospective studies These have included bronchogenic carcinoma, chronic interstitial pneumonia, sarcoidosis, left ventricular failure, ACEI-induced cough, and aspiration from a condition associated with pharyngeal dysfunction.    Chronic cough is often simultaneously caused by more than one condition. A single cause has been found from 38 to 82% of the time, multiple causes from 18 to 62%. Multiply caused cough has been the result of three diseases up to 42% of the time.       Based on hx and exam, this is most likely not asthma at all but rather:     Upper airway cough syndrome, so named because it's frequently impossible to sort out how much is  CR/sinusitis with freq throat clearing (which can be related to primary GERD)   vs  causing  secondary (" extra esophageal")  GERD from wide swings in gastric pressure that occur with throat clearing, often  promoting self use of mint and menthol lozenges that reduce the lower esophageal sphincter tone and exacerbate the problem further in a cyclical fashion.   These are the same pts (now being labeled as having "irritable larynx syndrome" by some cough centers) who not infrequently have a history of having failed to tolerate ace inhibitors,  dry powder inhalers or biphosphonates or report having atypical reflux symptoms that don't respond to standard doses of PPI , and  are easily confused as having aecopd or asthma flares by even experienced allergists/ pulmonologists.   The first step is to maximize acid suppression and eliminate cyclical coughing then regroup if the cough persists.  I had an extended discussion with the patient reviewing all relevant studies completed to date and  Lasting 20 m of 25 m ov    Please see instructions for details which were reviewed in writing and the patient given a copy highlighting the part that I personally wrote and discussed at today's ov.   See instructions for specific recommendations which were reviewed directly with the patient who was given a copy with highlighter outlining the key components.

## 2015-01-18 NOTE — Assessment & Plan Note (Signed)
Doubt this is cause of her ha's which have more of a tension pattern but rec trial of bystolic 5 mg daily until we sort this out

## 2015-01-28 ENCOUNTER — Ambulatory Visit (INDEPENDENT_AMBULATORY_CARE_PROVIDER_SITE_OTHER): Payer: 59 | Admitting: Internal Medicine

## 2015-01-28 ENCOUNTER — Encounter: Payer: Self-pay | Admitting: Internal Medicine

## 2015-01-28 VITALS — BP 118/80 | HR 65 | Ht 61.0 in | Wt 147.0 lb

## 2015-01-28 DIAGNOSIS — R058 Other specified cough: Secondary | ICD-10-CM

## 2015-01-28 DIAGNOSIS — R05 Cough: Secondary | ICD-10-CM

## 2015-01-28 DIAGNOSIS — I10 Essential (primary) hypertension: Secondary | ICD-10-CM

## 2015-01-28 MED ORDER — NEBIVOLOL HCL 5 MG PO TABS: 5.0000 mg | ORAL_TABLET | Freq: Every day | ORAL | Status: DC

## 2015-01-28 MED ORDER — BISOPROLOL-HYDROCHLOROTHIAZIDE 2.5-6.25 MG PO TABS: 1.0000 | ORAL_TABLET | Freq: Every day | ORAL | Status: DC

## 2015-01-28 NOTE — Patient Instructions (Addendum)
Continue bystolic 5 mg daily  - if you can't get the rx filled then fill the bisoprolol   Follow up is as needed

## 2015-01-28 NOTE — Progress Notes (Signed)
Subjective:     Patient ID: [name] [name], female   DOB: 1966-02-04, 49 y.o.   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   12/27/10/u ov/Wert cc seasonal itching sneezing then onset of wheezing with dusting and vacuuming x 6 months better when not cleaning and not needing any form of rescue or maint rx then x 24 hours acutely much worse, used daughter's albuterol benefit x 5 hours only.   Spring = fall needs clariton prn. Minimal dry cough >>Dulera rx   10/07/2011 Acute OV  Complains of loose stools, abd cramps , low appetite and severe stress.  Husband of 22 years left her 1 week ago with no warning. They have 2 teenage daughters and  she works nights at hospital . Has had to get friends to help stay with girls overnight.  She went to counseling this week .  Feels upset.  No thoughts of suicidal/homicidal ideation.  Feels sad and anxious at times.  No fever ,bloody stools , rash, chest pain or dyspena.  Has strong friend and work support  rec dulera 100 Take 2 puffs first thing in am and then another 2 puffs about 12 hours later as needed   01/14/2015 acute ov/Wert re: cough/ hbp Chief Complaint  Patient presents with  . Acute Visit    Pt c/o increased BP for the past few days. She also c/o dull HA for the past month.    started coughing mid august p flight home from cruise in August 2016 and no better p albuterol and sob > to ER with neg CTa  12/14/14. Breathing got better but cough persisted worse p supper nightly  Ha pattern is pm and not worse in am , gen band like s viz changes  rec Start bystolic 5 mg daily  Pantoprazole (protonix) 40 mg   Take  30-60 min before first meal of the day and Pepcid (famotidine)  20 mg one @  bedtime until return to office - this is the best way to tell whether stomach acid is contributing to your problem.   GERD diet  For cough take hycodan up to 2 tsp every 4 hours if needed      01/28/2015  f/u ov/Wert re: hbp/ cough  Chief Complaint  Patient presents with  . Follow-up    Pt states that her BP is now stable and HA has resolved. No new co's today.     Cough is bettter / no ha / no need for hycodan  No obvious day to day or daytime variability or assoc sob  cp or chest tightness, subjective wheeze or overt sinus or hb symptoms. No unusual exp hx or h/o childhood pna/ asthma or knowledge of premature birth.  Sleeping ok without nocturnal  or early am exacerbation  of respiratory  c/o's or need for noct saba. Also denies any obvious fluctuation of symptoms with weather or environmental changes or other aggravating or alleviating factors except as outlined above   Current Medications, Allergies, Complete Past Medical History, Past Surgical History, Family History, and Social History were reviewed in [employer] record.  ROS  The following are not active complaints unless bolded sore throat, dysphagia, dental problems, itching, sneezing,  nasal congestion or excess/ purulent secretions, ear ache,   fever, chills, sweats, unintended wt loss, classically pleuritic or exertional cp, hemoptysis,  orthopnea pnd or leg swelling, presyncope, palpitations, abdominal pain, anorexia,  nausea, vomiting, diarrhea  or change in bowel or bladder habits, change in stools or urine, dysuria,hematuria,  rash, arthralgias, visual complaints, headache, numbness, weakness or ataxia or problems with walking or coordination,  change in mood/affect or memory.               Past Medical History:  G2 P2--  GERD  Asthma   .  Past Surgical History:   Left middle finger sagital band repair, R Pyloplasty `93, T&A `84   Family History:   alcoholism-father, MI, Depression, stroke, HTN, chol-mother  Family History of Esophageal Cancer:Father  Family History of Breast Cancer:Mother  Family History of Colon Cancer:Maternal First Cousin   Social History:   Charity fundraiser at  [employer]. 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 Never smoked, rare EtOH, no drugs, 3 caffeinated drinks daily. Walk for 30 min 3-4 days/wk               Objective:   Physical Exam     GEN: A/Ox3; pleasant , amb wf nad   01/28/2015        147  Wt Readings from Last 3 Encounters:  01/14/15 65.772 kg (145 lb)  12/14/14 65.772 kg (145 lb)  11/06/14 65.318 kg (144 lb)    Vital signs reviewed   HEENT: [place]/AT, , EACs-clear, TMs-wnl, NOSE-clear, THROAT-clear  NECK: Supple w/ fair ROM; no JVD; normal carotid impulses w/o bruits; no thyromegaly or nodules palpated; no lymphadenopathy.  RESP clear to A and P,  No cough on insp or exp maneuvers  CARD: RRR, no m/r/g  GI: Soft & nt; nml bowel sounds; no organmegaly, no guarding or rebound.  Musco: Warm bil, no calf tenderness edema, clubbing, pulses intact   .   Assessment:

## 2015-01-29 NOTE — Assessment & Plan Note (Signed)
Adequate control on present rx, reviewed > no change in rx needed    Formulary restrictions will be an ongoing challenge for the forseable future and I would be happy to pick an alternative if the pt will first  provide me a list of them but pt  will need to return here for training for any new device that is required eg dpi vs hfa vs respimat.    In meantime we can always provide samples so the patient never runs out of any needed respiratory medications.

## 2015-01-29 NOTE — Assessment & Plan Note (Signed)
Resolved with treatment directed at GERD with no more need for narcotic pain cough medications. Recommended continued to treat the reflux with present meds/  Lifestyle adjustments

## 2015-02-02 ENCOUNTER — Encounter: Payer: Self-pay | Admitting: Sports Medicine

## 2015-02-02 ENCOUNTER — Other Ambulatory Visit: Payer: Self-pay

## 2015-02-02 ENCOUNTER — Ambulatory Visit (INDEPENDENT_AMBULATORY_CARE_PROVIDER_SITE_OTHER): Payer: 59 | Admitting: Sports Medicine

## 2015-02-02 VITALS — BP 118/72 | HR 67 | Ht 61.0 in | Wt 146.0 lb

## 2015-02-02 DIAGNOSIS — K589 Irritable bowel syndrome without diarrhea: Secondary | ICD-10-CM

## 2015-02-02 DIAGNOSIS — K219 Gastro-esophageal reflux disease without esophagitis: Secondary | ICD-10-CM

## 2015-02-02 DIAGNOSIS — Z1231 Encounter for screening mammogram for malignant neoplasm of breast: Secondary | ICD-10-CM

## 2015-02-02 DIAGNOSIS — I10 Essential (primary) hypertension: Secondary | ICD-10-CM

## 2015-02-02 DIAGNOSIS — Z803 Family history of malignant neoplasm of breast: Secondary | ICD-10-CM

## 2015-02-02 MED ORDER — VALSARTAN 80 MG PO TABS: 80.0000 mg | ORAL_TABLET | Freq: Every day | ORAL | Status: DC

## 2015-02-02 NOTE — Progress Notes (Signed)
  Subjective:    CC: Follow-up  HPI: Hypertension: Started on Bystolic by pulmonology, exhibiting fatigue and decreased exercise tolerance as expected.  Esophageal reflux: Resolved and well-controlled with Pepcid and omeprazole.  Past medical history, Surgical history, Family history not pertinant except as noted below, Social history, Allergies, and medications have been entered into the medical record, reviewed, and no changes needed.   Review of Systems: No fevers, chills, night sweats, weight loss, chest pain, or shortness of breath.   Objective:    General: Well Developed, well nourished, and in no acute distress.  Neuro: Alert and oriented x3, extra-ocular muscles intact, sensation grossly intact.  HEENT: Normocephalic, atraumatic, pupils equal round reactive to light, neck supple, no masses, no lymphadenopathy, thyroid nonpalpable.  Skin: Warm and dry, no rashes. Cardiac: Regular rate and rhythm, no murmurs rubs or gallops, no lower extremity edema.  Respiratory: Clear to auscultation bilaterally. Not using accessory muscles, speaking in full sentences.   Impression and Recommendations:

## 2015-02-02 NOTE — Assessment & Plan Note (Signed)
Excessive fatigue with by solid.  Switching to valsartan.

## 2015-02-10 LAB — COMPREHENSIVE METABOLIC PANEL WITH GFR
ALT: 11 U/L (ref 6–29)
Alkaline Phosphatase: 59 U/L (ref 33–115)
BUN: 10 mg/dL (ref 7–25)
Chloride: 108 mmol/L (ref 98–110)
Creat: 0.75 mg/dL (ref 0.50–1.10)
Glucose, Bld: 92 mg/dL (ref 65–99)
Potassium: 4.4 mmol/L (ref 3.5–5.3)
Sodium: 137 mmol/L (ref 135–146)

## 2015-02-10 LAB — COMPREHENSIVE METABOLIC PANEL
AST: 13 U/L (ref 10–35)
Albumin: 4.2 g/dL (ref 3.6–5.1)
CO2: 27 mmol/L (ref 20–31)
Calcium: 8.7 mg/dL (ref 8.6–10.2)
Total Bilirubin: 0.4 mg/dL (ref 0.2–1.2)
Total Protein: 6.2 g/dL (ref 6.1–8.1)

## 2015-02-10 LAB — LIPID PANEL
Cholesterol: 165 mg/dL (ref 125–200)
HDL: 35 mg/dL — ABNORMAL LOW (ref >=46)
LDL Cholesterol: 118 mg/dL (ref <130)
Total CHOL/HDL Ratio: 4.7 Ratio (ref <=5.0)
Triglycerides: 61 mg/dL (ref <150)
VLDL: 12 mg/dL (ref <30)

## 2015-02-10 LAB — CBC
HCT: 38.8 % (ref 36.0–46.0)
Hemoglobin: 13.1 g/dL (ref 12.0–15.0)
MCH: 30.8 pg (ref 26.0–34.0)
MCHC: 33.8 g/dL (ref 30.0–36.0)
MCV: 91.3 fL (ref 78.0–100.0)
MPV: 11.4 fL (ref 8.6–12.4)
Platelets: 220 K/uL (ref 150–400)
RBC: 4.25 MIL/uL (ref 3.87–5.11)
RDW: 14.2 % (ref 11.5–15.5)
WBC: 7.1 10*3/uL (ref 4.0–10.5)

## 2015-02-10 LAB — TSH: TSH: 1.359 u[IU]/mL (ref 0.350–4.500)

## 2015-02-10 LAB — HEMOGLOBIN A1C
Hgb A1c MFr Bld: 5.9 % — ABNORMAL HIGH (ref <5.7)
Mean Plasma Glucose: 123 mg/dL — ABNORMAL HIGH (ref <117)

## 2015-02-16 ENCOUNTER — Encounter: Payer: Self-pay | Admitting: Sports Medicine

## 2015-02-16 ENCOUNTER — Ambulatory Visit (INDEPENDENT_AMBULATORY_CARE_PROVIDER_SITE_OTHER): Payer: 59 | Admitting: Sports Medicine

## 2015-02-16 VITALS — BP 124/84 | HR 74 | Wt 146.0 lb

## 2015-02-16 DIAGNOSIS — I10 Essential (primary) hypertension: Secondary | ICD-10-CM

## 2015-02-16 NOTE — Progress Notes (Signed)
  Subjective:    CC: follow-up  HPI: Hypertension: Was experiencing some untoward side effects from her beta blocker, this has now resolved, she has excellent exercise tolerance and feels well. We did switch her to valsartan.  Past medical history, Surgical history, Family history not pertinant except as noted below, Social history, Allergies, and medications have been entered into the medical record, reviewed, and no changes needed.   Review of Systems: No fevers, chills, night sweats, weight loss, chest pain, or shortness of breath.   Objective:    General: Well Developed, well nourished, and in no acute distress.  Neuro: Alert and oriented x3, extra-ocular muscles intact, sensation grossly intact.  HEENT: Normocephalic, atraumatic, pupils equal round reactive to light, neck supple, no masses, no lymphadenopathy, thyroid nonpalpable.  Skin: Warm and dry, no rashes. Cardiac: Regular rate and rhythm, no murmurs rubs or gallops, no lower extremity edema.  Respiratory: Clear to auscultation bilaterally. Not using accessory muscles, speaking in full sentences.  Impression and Recommendations:

## 2015-02-16 NOTE — Assessment & Plan Note (Signed)
Controlled on valsartan. Return as needed.

## 2015-02-19 ENCOUNTER — Ambulatory Visit: Payer: 59

## 2015-03-19 ENCOUNTER — Encounter: Payer: Self-pay | Admitting: Sports Medicine

## 2015-03-19 ENCOUNTER — Ambulatory Visit
Admission: RE | Admit: 2015-03-19 | Discharge: 2015-03-19 | Disposition: A | Discharge status: Home / Self Care | Payer: 59 | Source: Ambulatory Visit

## 2015-03-19 DIAGNOSIS — N6310 Unspecified lump in the right breast, unspecified quadrant: Secondary | ICD-10-CM

## 2015-03-19 DIAGNOSIS — Z1231 Encounter for screening mammogram for malignant neoplasm of breast: Secondary | ICD-10-CM

## 2015-03-19 DIAGNOSIS — Z803 Family history of malignant neoplasm of breast: Secondary | ICD-10-CM

## 2015-04-02 ENCOUNTER — Ambulatory Visit
Admission: RE | Admit: 2015-04-02 | Discharge: 2015-04-02 | Disposition: A | Discharge status: Home / Self Care | Payer: 59 | Source: Ambulatory Visit | Attending: Sports Medicine | Admitting: Sports Medicine

## 2015-04-02 ENCOUNTER — Other Ambulatory Visit: Payer: Self-pay | Admitting: Sports Medicine

## 2015-04-02 DIAGNOSIS — N6310 Unspecified lump in the right breast, unspecified quadrant: Secondary | ICD-10-CM

## 2015-05-05 MED FILL — VALSARTAN 80 MG TABLET: 80 | 30 days supply | Qty: 30 | Fill #1

## 2015-06-10 ENCOUNTER — Other Ambulatory Visit: Payer: Self-pay | Admitting: Sports Medicine

## 2015-06-10 MED FILL — VALSARTAN 80 MG TABLET: 80 | 30 days supply | Qty: 30 | Fill #0

## 2015-07-06 MED FILL — VALSARTAN 80 MG TABLET: 80 | 30 days supply | Qty: 30 | Fill #1

## 2015-07-09 ENCOUNTER — Ambulatory Visit (INDEPENDENT_AMBULATORY_CARE_PROVIDER_SITE_OTHER): Payer: 59

## 2015-07-09 ENCOUNTER — Ambulatory Visit (INDEPENDENT_AMBULATORY_CARE_PROVIDER_SITE_OTHER): Payer: 59 | Admitting: Sports Medicine

## 2015-07-09 ENCOUNTER — Encounter: Payer: Self-pay | Admitting: Sports Medicine

## 2015-07-09 VITALS — BP 124/83 | HR 91 | Resp 18 | Wt 146.9 lb

## 2015-07-09 DIAGNOSIS — S93401A Sprain of unspecified ligament of right ankle, initial encounter: Secondary | ICD-10-CM | POA: Diagnosis not present

## 2015-07-09 DIAGNOSIS — M25571 Pain in right ankle and joints of right foot: Secondary | ICD-10-CM | POA: Diagnosis not present

## 2015-07-09 DIAGNOSIS — S99911A Unspecified injury of right ankle, initial encounter: Secondary | ICD-10-CM | POA: Diagnosis not present

## 2015-07-09 NOTE — Progress Notes (Signed)
  Subjective:    CC: Right ankle injury  HPI: Yesterday this pleasant 50 year old female took a misstep, and inverted her right ankle, she had immediate pain, swelling, no bruising. Pain is moderate, persistent, localized at the lateral malleolus without radiation.  Past medical history, Surgical history, Family history not pertinant except as noted below, Social history, Allergies, and medications have been entered into the medical record, reviewed, and no changes needed.   Review of Systems: No fevers, chills, night sweats, weight loss, chest pain, or shortness of breath.   Objective:    General: Well Developed, well nourished, and in no acute distress.  Neuro: Alert and oriented x3, extra-ocular muscles intact, sensation grossly intact.  HEENT: Normocephalic, atraumatic, pupils equal round reactive to light, neck supple, no masses, no lymphadenopathy, thyroid nonpalpable.  Skin: Warm and dry, no rashes. Cardiac: Regular rate and rhythm, no murmurs rubs or gallops, no lower extremity edema.  Respiratory: Clear to auscultation bilaterally. Not using accessory muscles, speaking in full sentences. Right Ankle: Mild visible swelling Range of motion is full in all directions. Strength is 5/5 in all directions. Stable lateral and medial ligaments; squeeze test and kleiger test unremarkable; Talar dome nontender; No pain at base of 5th MT; No tenderness over cuboid; No tenderness over N spot or navicular prominence Tender to palpation of the anterior and posterior aspects of the lateral malleolus No sign of peroneal tendon subluxations; Negative tarsal tunnel tinel's Able to walk 4 steps.  Impression and Recommendations:

## 2015-07-09 NOTE — Assessment & Plan Note (Signed)
Mild tenderness over the lateral malleolus so we have will obtain an x-ray, ASO brace. Home exercises, naproxen as needed.  Rturn to see me in 2 weeks.

## 2015-07-27 ENCOUNTER — Ambulatory Visit (INDEPENDENT_AMBULATORY_CARE_PROVIDER_SITE_OTHER): Payer: 59 | Admitting: Sports Medicine

## 2015-07-27 ENCOUNTER — Encounter: Payer: Self-pay | Admitting: Sports Medicine

## 2015-07-27 VITALS — BP 117/83 | HR 81 | Temp 98.2°F | Resp 18 | Wt 143.9 lb

## 2015-07-27 DIAGNOSIS — R05 Cough: Secondary | ICD-10-CM

## 2015-07-27 DIAGNOSIS — R058 Other specified cough: Secondary | ICD-10-CM

## 2015-07-27 DIAGNOSIS — S93401A Sprain of unspecified ligament of right ankle, initial encounter: Secondary | ICD-10-CM

## 2015-07-27 MED ORDER — FLUTICASONE PROPIONATE 50 MCG/ACT NA SUSP: NASAL | Status: DC

## 2015-07-27 NOTE — Assessment & Plan Note (Signed)
Improving sprain, symptoms are predominantly referable to the calcaneofibular ligament.  discontinue ASO brace, return as needed.

## 2015-07-27 NOTE — Progress Notes (Signed)
  Subjective:    CC: follow-up  HPI: Right ankle sprain: Improving, still has some pain at the CFL.  Past medical history, Surgical history, Family history not pertinant except as noted below, Social history, Allergies, and medications have been entered into the medical record, reviewed, and no changes needed.   Review of Systems: No fevers, chills, night sweats, weight loss, chest pain, or shortness of breath.   Objective:    General: Well Developed, well nourished, and in no acute distress.  Neuro: Alert and oriented x3, extra-ocular muscles intact, sensation grossly intact.  HEENT: Normocephalic, atraumatic, pupils equal round reactive to light, neck supple, no masses, no lymphadenopathy, thyroid nonpalpable.  Skin: Warm and dry, no rashes. Cardiac: Regular rate and rhythm, no murmurs rubs or gallops, no lower extremity edema.  Respiratory: Clear to auscultation bilaterally. Not using accessory muscles, speaking in full sentences. Right Ankle: No visible erythema or swelling. Range of motion is full in all directions. Strength is 5/5 in all directions. Stable lateral and medial ligaments; squeeze test and kleiger test unremarkable; Talar dome nontender; No pain at base of 5th MT; No tenderness over cuboid; No tenderness over N spot or navicular prominence No tenderness on posterior aspects of lateral and medial malleolus No sign of peroneal tendon subluxations; Negative tarsal tunnel tinel's Only a bit of pain over the CFL  Impression and Recommendations:

## 2015-08-18 MED FILL — VALSARTAN 80 MG TABLET: 80 | 30 days supply | Qty: 30 | Fill #2

## 2015-09-18 MED FILL — VALSARTAN 80 MG TABLET: 80 | 30 days supply | Qty: 30 | Fill #3

## 2015-10-20 ENCOUNTER — Other Ambulatory Visit: Payer: Self-pay | Admitting: Sports Medicine

## 2015-10-20 MED FILL — VALSARTAN 80 MG TABLET: 80 | 30 days supply | Qty: 30 | Fill #0

## 2015-11-18 MED FILL — VALSARTAN 80 MG TABLET: 80 | 30 days supply | Qty: 30 | Fill #1

## 2015-12-23 MED FILL — VALSARTAN 80 MG TABLET: 80 | 30 days supply | Qty: 30 | Fill #2

## 2016-01-26 MED FILL — VALSARTAN 80 MG TABLET: 80 | 30 days supply | Qty: 30 | Fill #3

## 2016-03-04 ENCOUNTER — Other Ambulatory Visit: Payer: Self-pay | Admitting: Sports Medicine

## 2016-03-04 MED FILL — VALSARTAN 80 MG TABLET: 80 | 30 days supply | Qty: 30 | Fill #0

## 2016-03-31 ENCOUNTER — Ambulatory Visit (INDEPENDENT_AMBULATORY_CARE_PROVIDER_SITE_OTHER): Payer: 59 | Admitting: Family Medicine

## 2016-03-31 ENCOUNTER — Encounter: Payer: Self-pay | Admitting: Family Medicine

## 2016-03-31 VITALS — BP 116/79 | HR 83 | Temp 97.7°F | Ht 61.0 in | Wt 148.0 lb

## 2016-03-31 DIAGNOSIS — N12 Tubulo-interstitial nephritis, not specified as acute or chronic: Secondary | ICD-10-CM

## 2016-03-31 DIAGNOSIS — R109 Unspecified abdominal pain: Secondary | ICD-10-CM

## 2016-03-31 DIAGNOSIS — R3915 Urgency of urination: Secondary | ICD-10-CM

## 2016-03-31 LAB — POCT URINALYSIS DIPSTICK
Bilirubin, UA: NEGATIVE
Blood, UA: NEGATIVE
Glucose, UA: NEGATIVE
Ketones, UA: NEGATIVE
LEUKOCYTES UA: NEGATIVE
Nitrite, UA: NEGATIVE
PROTEIN UA: NEGATIVE
Spec Grav, UA: 1.025
UROBILINOGEN UA: 0.2
pH, UA: 5.5

## 2016-03-31 MED ORDER — CEFDINIR 300 MG PO CAPS: 300.0000 mg | ORAL_CAPSULE | Freq: Two times a day (BID) | ORAL | 0 refills | Status: DC

## 2016-03-31 MED ORDER — FLUCONAZOLE 150 MG PO TABS: 150.0000 mg | ORAL_TABLET | Freq: Once | ORAL | 0 refills | Status: AC

## 2016-03-31 MED FILL — CEFDINIR 300 MG CAPSULE: 300 | 7 days supply | Qty: 14 | Fill #0

## 2016-03-31 MED FILL — FLUCONAZOLE 150 MG TABLET: 150 | 1 days supply | Qty: 1 | Fill #0

## 2016-03-31 NOTE — Progress Notes (Signed)
Gregary SignsSara H Thune is a 50 y.o. female [facility] today for right flank pain, chills, urinary frequency and urgency. Symptoms have been present for a few days and are consistent with previous episodes of UTI. She has a history of right hydroureter with frequent UTIs. She has had pyelonephritis in the past. She has not tried any medications yet.    Past Medical History:  Diagnosis Date  . Asthma   . Essential hypertension 01/14/2015  . GERD (gastroesophageal reflux disease)   . Hydronephrosis, right    chronic  . IBS (irritable bowel syndrome)   . Murmur    Past Surgical History:  Procedure Laterality Date  . HAND SURGERY     LT 2002  . PYELOPLASTY     RT kidney 1993  . pyleoplasty     right kidney  . TONSILLECTOMY     Social History  Substance Use Topics  . Smoking status: Never Smoker  . Smokeless tobacco: Never Used  . Alcohol use No   family history includes Alcohol abuse in her father; Breast cancer in her mother; Cancer in her mother; Depression in her father; Esophageal cancer in her father; Heart attack in her father and mother; Heart disease in her mother; Hypertension in her father and mother; Mental illness in her paternal aunt; Stroke in her father.  ROS as above:  Medications: Current Outpatient Prescriptions  Medication Sig Dispense Refill  . famotidine (PEPCID) 20 MG tablet One at bedtime 30 tablet 2  . fluticasone (FLONASE) 50 MCG/ACT nasal spray One spray in each nostril twice a day, use left hand for right nostril, and right hand for left nostril. 48 g 3  . hyoscyamine (LEVSIN SL) 0.125 MG SL tablet Place 0.125 mg under the tongue 3 (three) times daily as needed.    . loratadine (CLARITIN) 10 MG tablet Take 10 mg by mouth daily as needed.     . meloxicam (MOBIC) 15 MG tablet One tab PO qAM with breakfast. (Patient taking  differently: 15 mg as needed. One tab PO qAM with breakfast.) 90 tablet 3  . pantoprazole (PROTONIX) 40 MG tablet Take 1 tablet (40 mg total) by mouth daily. Take 30-60 min before first meal of the day 30 tablet 2  . valsartan (DIOVAN) 80 MG tablet TAKE 1 TABLET (80 MG TOTAL) BY MOUTH DAILY. 30 tablet 3  . cefdinir (OMNICEF) 300 MG capsule Take 1 capsule (300 mg total) by mouth 2 (two) times daily. 14 capsule 0  . fluconazole (DIFLUCAN) 150 MG tablet Take 1 tablet (150 mg total) by mouth once. 1 tablet 0   No current facility-administered medications for this visit.    Allergies  Allergen Reactions  . Sulfonamide Derivatives Rash    Blisters in mucous membranes    Health Maintenance Health Maintenance  Topic Date Due  . INFLUENZA VACCINE  12/01/2015  . PAP SMEAR  06/05/2016  . TETANUS/TDAP  05/02/2022  . HIV Screening  Completed     Exam:  BP 116/79   Pulse 83   Temp 97.7 F (36.5 C)   Ht 5\' 1"  (1.549 m)   Wt 148 lb (67.1 kg)   BMI 27.96 kg/m  Gen: Well NAD HEENT: EOMI,  MMM Lungs: Normal work of breathing. CTABL Heart: RRR no MRG Abd: NABS, Soft. Nondistended, Nontender Mild CVA tenderness to percussion right side negative left. Exts: Brisk capillary refill, warm and well perfused.  Results for orders placed or performed in visit on 03/31/16 (from the past 72 hour(s))  POCT Urinalysis Dipstick     Status: None   Collection Time: 03/31/16  2:28 PM  Result Value Ref Range   Color, UA yellow    Clarity, UA slightly cloudy    Glucose, UA neg    Bilirubin, UA neg    Ketones, UA neg    Spec Grav, UA 1.025    Blood, UA neg    pH, UA 5.5    Protein, UA neg    Urobilinogen, UA 0.2    Nitrite, UA neg    Leukocytes, UA Negative Negative   No results found.    Assessment and Plan: 50 y.o. female with probable early right UTI with possible pyelonephritis. Kidney stone is also a possibility. Culture pending. Treat empirically with Omnicef. Use fluconazole in case  patient develops a yeast infection. Recheck if not improving.   Orders Placed This Encounter  Procedures  . Urine Culture  . POCT Urinalysis Dipstick    Discussed warning signs or symptoms. Please see discharge instructions. Patient expresses understanding.

## 2016-03-31 NOTE — Patient Instructions (Signed)
Thank you for coming in today. Take Omnicef twice daily. Use fluconazole if you develop a yeast infection. We should have answers about the urine culture soon. Return if worsening.   Pyelonephritis, Adult Introduction Pyelonephritis is a kidney infection. The kidneys are the organs that filter a person's blood and move waste out of the bloodstream and into the urine. Urine passes from the kidneys, through the ureters, and into the bladder. There are two main types of pyelonephritis:  Infections that come on quickly without any warning (acute pyelonephritis).  Infections that last for a long period of time (chronic pyelonephritis). In most cases, the infection clears up with treatment and does not cause further problems. More severe infections or chronic infections can sometimes spread to the bloodstream or lead to other problems with the kidneys. What are the causes? This condition is usually caused by:  Bacteria traveling from the bladder to the kidney through infected urine. The urine in the bladder can become infected with bacteria from:  Bladder infection (cystitis).  Inflammation of the prostate gland (prostatitis).  Sexual intercourse, in females.  Bacteria traveling from the bloodstream to the kidney. What increases the risk? This condition is more likely to develop in:  Pregnant women.  Older people.  People who have diabetes.  People who have kidney stones or bladder stones.  People who have other abnormalities of the kidney or ureter.  People who have a catheter placed in the bladder.  People who have cancer.  People who are sexually active.  Women who use spermicides.  People who have had a prior urinary tract infection. What are the signs or symptoms? Symptoms of this condition include:  Frequent urination.  Strong or persistent urge to urinate.  Burning or stinging when urinating.  Abdominal pain.  Back pain.  Pain in the side or flank  area.  Fever.  Chills.  Blood in the urine, or dark urine.  Nausea.  Vomiting. How is this diagnosed? This condition may be diagnosed based on:  Medical history and physical exam.  Urine tests.  Blood tests. You may also have imaging tests of the kidneys, such as an ultrasound or CT scan. How is this treated? Treatment for this condition may depend on the severity of the infection.  If the infection is mild and is found early, you may be treated with antibiotic medicines taken by mouth. You will need to drink fluids to remain hydrated.  If the infection is more severe, you may need to stay in the hospital and receive antibiotics given directly into a vein through an IV tube. You may also need to receive fluids through an IV tube if you are not able to remain hydrated. After your hospital stay, you may need to take oral antibiotics for a period of time. Other treatments may be required, depending on the cause of the infection. Follow these instructions at home: Medicines  Take over-the-counter and prescription medicines only as told by your health care provider.  If you were prescribed an antibiotic medicine, take it as told by your health care provider. Do not stop taking the antibiotic even if you start to feel better. General instructions  Drink enough fluid to keep your urine clear or pale yellow.  Avoid caffeine, tea, and carbonated beverages. They tend to irritate the bladder.  Urinate often. Avoid holding in urine for long periods of time.  Urinate before and after sex.  After a bowel movement, women should cleanse from front to back. Use each  tissue only once.  Keep all follow-up visits as told by your health care provider. This is important. Contact a health care provider if:  Your symptoms do not get better after 2 days of treatment.  Your symptoms get worse.  You have a fever. Get help right away if:  You are unable to take your antibiotics or  fluids.  You have shaking chills.  You vomit.  You have severe flank or back pain.  You have extreme weakness or fainting. This information is not intended to replace advice given to you by your health care provider. Make sure you discuss any questions you have with your health care provider. Document Released: 04/18/2005 Document Revised: 09/24/2015 Document Reviewed: 08/11/2014  2017 [employer]

## 2016-04-02 LAB — URINE CULTURE: Organism ID, Bacteria: NO GROWTH

## 2016-04-07 MED FILL — VALSARTAN 80 MG TABLET: 80 | 30 days supply | Qty: 30 | Fill #1

## 2016-04-28 DIAGNOSIS — H00011 Hordeolum externum right upper eyelid: Secondary | ICD-10-CM | POA: Diagnosis not present

## 2016-05-17 MED FILL — VALSARTAN 80 MG TABLET: 80 | 30 days supply | Qty: 30 | Fill #2

## 2016-07-07 MED FILL — VALSARTAN 80 MG TABLET: 80 | 30 days supply | Qty: 30 | Fill #3

## 2016-08-25 ENCOUNTER — Other Ambulatory Visit: Payer: Self-pay | Admitting: Sports Medicine

## 2016-08-25 MED FILL — VALSARTAN 80 MG TABLET: 80 | 30 days supply | Qty: 30 | Fill #0

## 2016-09-29 ENCOUNTER — Other Ambulatory Visit: Payer: Self-pay | Admitting: Sports Medicine

## 2016-09-29 MED FILL — VALSARTAN 80 MG TABLET: 80 | 30 days supply | Qty: 30 | Fill #0

## 2016-10-06 ENCOUNTER — Encounter: Payer: Self-pay | Admitting: Sports Medicine

## 2016-10-06 ENCOUNTER — Ambulatory Visit (INDEPENDENT_AMBULATORY_CARE_PROVIDER_SITE_OTHER): Payer: 59 | Admitting: Sports Medicine

## 2016-10-06 DIAGNOSIS — R05 Cough: Secondary | ICD-10-CM | POA: Diagnosis not present

## 2016-10-06 DIAGNOSIS — J0100 Acute maxillary sinusitis, unspecified: Secondary | ICD-10-CM | POA: Insufficient documentation

## 2016-10-06 DIAGNOSIS — R058 Other specified cough: Secondary | ICD-10-CM

## 2016-10-06 MED ORDER — HYDROCOD POLST-CPM POLST ER 10-8 MG/5ML PO SUER: 5.0000 mL | Freq: Two times a day (BID) | ORAL | 0 refills | Status: DC | PRN

## 2016-10-06 MED ORDER — AZITHROMYCIN 250 MG PO TABS: ORAL_TABLET | ORAL | 0 refills | Status: DC

## 2016-10-06 MED ORDER — FLUTICASONE PROPIONATE 50 MCG/ACT NA SUSP: NASAL | 3 refills | Status: DC

## 2016-10-06 MED FILL — AZITHROMYCIN 250 MG TABLET: 250 | 5 days supply | Qty: 6 | Fill #0

## 2016-10-06 MED FILL — HYDROCODONE-CHLORPHENIRAM S: 10-8 | 12 days supply | Qty: 120 | Fill #0

## 2016-10-06 MED FILL — FLUTICASONE PROP 50 MCG SPR: 50 | 90 days supply | Qty: 48 | Fill #0

## 2016-10-06 NOTE — Assessment & Plan Note (Signed)
With postnasal drip syndrome. Present for over a week with second sickening, azithromycin, Flonase, Tussionex. Return as needed.

## 2016-10-06 NOTE — Progress Notes (Signed)
  Subjective:    CC: Cough  HPI: For over one week now this pleasant 51 year old female has had cough, runny nose, sinus pain and pressure, she initially improved and then worsened. Low-grade fevers, constitutional symptoms. No shortness of breath, no GI symptoms.  Past medical history:  Negative.  See flowsheet/record as well for more information.  Surgical history: Negative.  See flowsheet/record as well for more information.  Family history: Negative.  See flowsheet/record as well for more information.  Social history: Negative.  See flowsheet/record as well for more information.  Allergies, and medications have been entered into the medical record, reviewed, and no changes needed.   Review of Systems: No fevers, chills, night sweats, weight loss, chest pain, or shortness of breath.   Objective:    General: Well Developed, well nourished, and in no acute distress.  Neuro: Alert and oriented x3, extra-ocular muscles intact, sensation grossly intact.  HEENT: Normocephalic, atraumatic, pupils equal round reactive to light, neck supple, no masses, no lymphadenopathy, thyroid nonpalpable. Oropharynx, nasopharynx, ear canals unremarkable. Skin: Warm and dry, no rashes. Cardiac: Regular rate and rhythm, no murmurs rubs or gallops, no lower extremity edema.  Respiratory: Clear to auscultation bilaterally. Not using accessory muscles, speaking in full sentences.  Impression and Recommendations:    Acute maxillary sinusitis With postnasal drip syndrome. Present for over a week with second sickening, azithromycin, Flonase, Tussionex. Return as needed.  I spent 25 minutes with this patient, greater than 50% was face-to-face time counseling regarding the above diagnoses

## 2016-11-03 ENCOUNTER — Ambulatory Visit (INDEPENDENT_AMBULATORY_CARE_PROVIDER_SITE_OTHER): Payer: 59 | Admitting: Sports Medicine

## 2016-11-03 DIAGNOSIS — I10 Essential (primary) hypertension: Secondary | ICD-10-CM | POA: Diagnosis not present

## 2016-11-03 DIAGNOSIS — Z0000 Encounter for general adult medical examination without abnormal findings: Secondary | ICD-10-CM

## 2016-11-03 DIAGNOSIS — K429 Umbilical hernia without obstruction or gangrene: Secondary | ICD-10-CM | POA: Diagnosis not present

## 2016-11-03 MED ORDER — VALSARTAN 80 MG PO TABS: 80.0000 mg | ORAL_TABLET | Freq: Every day | ORAL | 3 refills | Status: DC

## 2016-11-03 MED FILL — VALSARTAN 80 MG TABLET: 80 | 90 days supply | Qty: 90 | Fill #0

## 2016-11-03 NOTE — Assessment & Plan Note (Signed)
Mammo

## 2016-11-03 NOTE — Assessment & Plan Note (Signed)
Palpable on valsalva, referral to general surgery.

## 2016-11-03 NOTE — Assessment & Plan Note (Signed)
Well controlled, refilling meds, rechecking routine bloodwork.

## 2016-11-03 NOTE — Progress Notes (Signed)
  Subjective:    CC: Follow-up  HPI: Hypertension: Well controlled  Preventive measures: Due for mammogram  Abdominal mass: Occurred after a fit of coughing, as well as trying to lift an object, she now has a reducible bulge above her umbilicus. Minimal pain, no symptoms of obstruction.  Past medical history:  Negative.  See flowsheet/record as well for more information.  Surgical history: Negative.  See flowsheet/record as well for more information.  Family history: Negative.  See flowsheet/record as well for more information.  Social history: Negative.  See flowsheet/record as well for more information.  Allergies, and medications have been entered into the medical record, reviewed, and no changes needed.   Review of Systems: No fevers, chills, night sweats, weight loss, chest pain, or shortness of breath.   Objective:    General: Well Developed, well nourished, and in no acute distress.  Neuro: Alert and oriented x3, extra-ocular muscles intact, sensation grossly intact.  HEENT: Normocephalic, atraumatic, pupils equal round reactive to light, neck supple, no masses, no lymphadenopathy, thyroid nonpalpable.  Skin: Warm and dry, no rashes. Cardiac: Regular rate and rhythm, no murmurs rubs or gallops, no lower extremity edema.  Respiratory: Clear to auscultation bilaterally. Not using accessory muscles, speaking in full sentences. Abdomen: Soft, tender to palpation above the umbilicus, on Valsalva I feel a hernia just cranial to the umbilicus, it is reducible, minimally tender. Abdomen is not distended, normal bowel sounds, no guarding, rigidity, rebound tenderness.  Impression and Recommendations:    Essential hypertension Well controlled, refilling meds, rechecking routine bloodwork.  Umbilical hernia Palpable on valsalva, referral to general surgery.  Annual physical exam Mammo.

## 2016-12-01 DIAGNOSIS — K589 Irritable bowel syndrome without diarrhea: Secondary | ICD-10-CM | POA: Diagnosis not present

## 2016-12-01 DIAGNOSIS — I10 Essential (primary) hypertension: Secondary | ICD-10-CM | POA: Diagnosis not present

## 2016-12-01 DIAGNOSIS — K439 Ventral hernia without obstruction or gangrene: Secondary | ICD-10-CM | POA: Diagnosis not present

## 2016-12-02 ENCOUNTER — Other Ambulatory Visit: Payer: Self-pay | Admitting: General Surgery

## 2017-01-06 ENCOUNTER — Encounter (HOSPITAL_COMMUNITY): Payer: Self-pay

## 2017-01-06 ENCOUNTER — Encounter (HOSPITAL_COMMUNITY)
Admission: RE | Admit: 2017-01-06 | Discharge: 2017-01-06 | Disposition: A | Discharge status: Home / Self Care | Payer: 59 | Source: Ambulatory Visit | Attending: General Surgery | Admitting: General Surgery

## 2017-01-06 DIAGNOSIS — K436 Other and unspecified ventral hernia with obstruction, without gangrene: Secondary | ICD-10-CM | POA: Insufficient documentation

## 2017-01-06 DIAGNOSIS — Z01812 Encounter for preprocedural laboratory examination: Secondary | ICD-10-CM | POA: Diagnosis present

## 2017-01-06 DIAGNOSIS — I10 Essential (primary) hypertension: Secondary | ICD-10-CM | POA: Diagnosis not present

## 2017-01-06 DIAGNOSIS — K219 Gastro-esophageal reflux disease without esophagitis: Secondary | ICD-10-CM | POA: Diagnosis not present

## 2017-01-06 DIAGNOSIS — K589 Irritable bowel syndrome without diarrhea: Secondary | ICD-10-CM | POA: Diagnosis not present

## 2017-01-06 DIAGNOSIS — Z79899 Other long term (current) drug therapy: Secondary | ICD-10-CM | POA: Diagnosis not present

## 2017-01-06 DIAGNOSIS — Z882 Allergy status to sulfonamides status: Secondary | ICD-10-CM | POA: Diagnosis not present

## 2017-01-06 LAB — CBC WITH DIFFERENTIAL/PLATELET
BASOS ABS: 0 10*3/uL (ref 0.0–0.1)
BASOS PCT: 1 %
EOS ABS: 0.3 10*3/uL (ref 0.0–0.7)
Eosinophils Relative: 3 %
HEMATOCRIT: 37.4 % (ref 36.0–46.0)
HEMOGLOBIN: 12.5 g/dL (ref 12.0–15.0)
Lymphocytes Relative: 31 %
Lymphs Abs: 2.5 10*3/uL (ref 0.7–4.0)
MCH: 31.5 pg (ref 26.0–34.0)
MCHC: 33.4 g/dL (ref 30.0–36.0)
MCV: 94.2 fL (ref 78.0–100.0)
MONO ABS: 0.8 10*3/uL (ref 0.1–1.0)
Monocytes Relative: 10 %
NEUTROS ABS: 4.4 10*3/uL (ref 1.7–7.7)
NEUTROS PCT: 55 %
Platelets: 219 10*3/uL (ref 150–400)
RBC: 3.97 MIL/uL (ref 3.87–5.11)
RDW: 13.8 % (ref 11.5–15.5)
WBC: 8 10*3/uL (ref 4.0–10.5)

## 2017-01-06 LAB — HCG, SERUM, QUALITATIVE: Preg, Serum: NEGATIVE

## 2017-01-06 LAB — COMPREHENSIVE METABOLIC PANEL
ALBUMIN: 3.8 g/dL (ref 3.5–5.0)
ALT: 19 U/L (ref 14–54)
ANION GAP: 6 (ref 5–15)
AST: 18 U/L (ref 15–41)
Alkaline Phosphatase: 57 U/L (ref 38–126)
BILIRUBIN TOTAL: 0.6 mg/dL (ref 0.3–1.2)
BUN: 12 mg/dL (ref 6–20)
CO2: 27 mmol/L (ref 22–32)
Calcium: 8.7 mg/dL — ABNORMAL LOW (ref 8.9–10.3)
Chloride: 106 mmol/L (ref 101–111)
Creatinine, Ser: 0.83 mg/dL (ref 0.44–1.00)
GFR calc Af Amer: >60 mL/min (ref >60)
GFR calc non Af Amer: >60 mL/min (ref >60)
GLUCOSE: 93 mg/dL (ref 65–99)
POTASSIUM: 3.2 mmol/L — AB (ref 3.5–5.1)
SODIUM: 139 mmol/L (ref 135–145)
TOTAL PROTEIN: 6.3 g/dL — AB (ref 6.5–8.1)

## 2017-01-06 NOTE — Pre-Procedure Instructions (Signed)
[name] SignsSara H [name]  01/06/2017      [facility] [place], [place] - [id number] [address] [address] [place] [address] Phone: [phone] Fax: [phone]    Your procedure is scheduled on January 10, 2017.  [facility] Admitting at 600 AM.  Call this number if you have problems the morning of surgery:  [phone]   Remember:  Do not eat food or drink liquids after midnight.  Take these medicines the morning of surgery with A SIP OF WATER famotidine (pepcid), fluticasone (flonase), hyoscyamine (levsin).  7 days prior to surgery STOP taking any meloxicam (mobic), Aspirin, Aleve, Naproxen, Ibuprofen, Motrin, Advil, Goody's, BC's, all herbal medications, fish oil, and all vitamins   Do not wear jewelry, make-up or nail polish.  Do not wear lotions, powders, or perfumes, or deoderant.  Do not shave 48 hours prior to surgery.   Do not bring valuables to the hospital.  [facility] is not [facility] for any belongings or valuables.  Contacts, dentures or bridgework may not be worn into surgery.  Leave your suitcase in the car.  After surgery it may be brought to your room.  For patients admitted to the hospital, discharge time will be determined by your treatment team.  Patients discharged the day of surgery will not be allowed to drive home.   Special instructions:   [place]- Preparing For Surgery  Before surgery, you can play an important role. Because skin is not sterile, your skin needs to be as free of germs as possible. You can reduce the number of germs on your skin by washing with CHG (chlorahexidine gluconate) Soap before surgery.  CHG is an antiseptic cleaner which kills germs and bonds with the skin to continue killing germs even after washing.  Please do not use if you have an allergy to CHG or antibacterial soaps. If your skin becomes reddened/irritated stop using the CHG.  Do not shave  (including legs and underarms) for at least 48 hours prior to first CHG shower. It is OK to shave your face.  Please follow these instructions carefully.   1. Shower the NIGHT BEFORE SURGERY and the MORNING OF SURGERY with CHG.   2. If you chose to wash your hair, wash your hair first as usual with your normal shampoo.  3. After you shampoo, rinse your hair and body thoroughly to remove the shampoo.  4. Use CHG as you would any other liquid soap. You can apply CHG directly to the skin and wash gently with a scrungie or a clean washcloth.   5. Apply the CHG Soap to your body ONLY FROM THE NECK DOWN.  Do not use on open wounds or open sores. Avoid contact with your eyes, ears, mouth and genitals (private parts). Wash genitals (private parts) with your normal soap.  6. Wash thoroughly, paying special attention to the area where your surgery will be performed.  7. Thoroughly rinse your body with warm water from the neck down.  8. DO NOT shower/wash with your normal soap after using and rinsing off the CHG Soap.  9. Pat yourself dry with a CLEAN TOWEL.   10. Wear CLEAN PAJAMAS   11. Place CLEAN SHEETS on your bed the night of your first shower and DO NOT SLEEP WITH PETS.    Day of Surgery: Do not apply any deodorants/lotions. Please wear clean clothes to the hospital/surgery center.  Please read over the following fact sheets that you were given. Pain Booklet, Coughing and Deep Breathing and Surgical Site Infection Prevention

## 2017-01-06 NOTE — Progress Notes (Signed)
RUE:[id number]:Thekkekandam, Ihor Austinhomas J, MD  Cardiologist:  EKG:  Stress test:  ECHO:  Cardiac Cath:  Chest x-ray

## 2017-01-08 NOTE — H&P (Signed)
[name] Location: [facility] Patient #: [id number] DOB: [birth year] Married / Language: English / Race: White Female       History of Present Illness        The patient is a 51 year old female who presents with an umbilical hernia. This is a very pleasant 51 year old female. 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 I have known for some time. She is referred by Dr. Velva Harman for evaluation of supraumbilical hernia that has become symptomatic.       She has noticed some weakness above her umbilicus for many years. This has been complicated by increasing protrusion and pain for 3 months. This first happened 3 months ago when she had a cold and was coughing a lot. It is never truly been incarcerated. It now bulges more and is more painful. She is now we are getting an abdominal binder when she goes to work. She wants to have this repaired     Comorbidities include right pyeloplasty for UPJ obstruction with a right flank incision. That is the only abdominal surgery. Irritable bowel syndrome. Hypertension. No other medical problems FH - reveals mother died of lung cancer but also had breast cancer, stroke, myocardial infarction. Father and mother divorced but she knows that he died of esophageal cancer and smoking drank a lot. One sister has hypertension SH - reveals she is married with 2 children. 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 Denies tobacco. Takes alcohol occasionally.      Examination revealed a reducible bulge in the midline above the umbilicus. This may be a low-lying epigastric hernia. Really didn't have a mass at the umbilicus. We decided to go ahead with laparoscopic repair of epigastric hernia with mesh, possible open repair. I discussed the indications, details, techniques, and numerous risk of the surgery with her. She is aware of the risk of bleeding, infection,  conversion to open laparotomy, recurrence of the hernia, nerve damage with chronic pain, injury to adjacent organs with major reconstructive surgery. She understands all these issues. She understands temporary disability. All of her questions were answered. She agrees with this plan.   Past Surgical History  Oral Surgery  Tonsillectomy   Diagnostic Studies History Colonoscopy  never Mammogram  1-3 years ago Pap Smear  1-5 years ago  Allergies ( Sulfur  blisters in mucus membranes  Medication History  Valsartan (  Tablet, Oral) Active. Fluticasone Propionate (50MCG/ACT Suspension, Nasal) Active. Xyzal (  Tablet, Oral) Active. Meloxicam (Oral) Specific strength unknown - Active. Hyoscyamine Sulfate (0.125MG  Tab Sublingual, Sublingual) Active. Medications Reconciled  Social History  Alcohol use  Occasional alcohol use. Caffeine use  Carbonated beverages, Coffee, Tea. No drug use  Tobacco use  Never smoker.  Family History  Alcohol Abuse  Father. Breast Cancer  Mother. Cerebrovascular Accident  Mother. Depression  Mother. Heart Disease  Mother. Hypertension  Mother. Migraine Headache  Mother.  Pregnancy / Birth History  Age at menarche  16 years. Gravida  2 Length (months) of breastfeeding  7-12 Maternal age  53-30 Para  2 Regular periods   Other Problems  Asthma  Gastroesophageal Reflux Disease  High blood pressure  Umbilical Hernia Repair     Review of Systems  General Not Present- Appetite Loss, Chills, Fatigue, Fever, Night Sweats, Weight Gain and Weight Loss. Skin Not Present- Change in Wart/Mole, Dryness, Hives, Jaundice, New Lesions, Non-Healing Wounds, Rash and Ulcer. HEENT Present- Seasonal Allergies and  Wears glasses/contact lenses. Not Present- Earache, Hearing Loss, Hoarseness, Nose Bleed, Oral Ulcers, Ringing in the Ears, Sinus Pain, Sore Throat, Visual Disturbances and Yellow Eyes. Respiratory Not Present-  Bloody sputum, Chronic Cough, Difficulty Breathing, Snoring and Wheezing. Breast Not Present- Breast Mass, Breast Pain, Nipple Discharge and Skin Changes. Cardiovascular Not Present- Chest Pain, Difficulty Breathing Lying Down, Leg Cramps, Palpitations, Rapid Heart Rate, Shortness of Breath and Swelling of Extremities. Gastrointestinal Present- Abdominal Pain. Not Present- Bloating, Bloody Stool, Change in Bowel Habits, Chronic diarrhea, Constipation, Difficulty Swallowing, Excessive gas, Gets full quickly at meals, Hemorrhoids, Indigestion, Nausea, Rectal Pain and Vomiting. Female Genitourinary Not Present- Frequency, Nocturia, Painful Urination, Pelvic Pain and Urgency. Musculoskeletal Not Present- Back Pain, Joint Pain, Joint Stiffness, Muscle Pain, Muscle Weakness and Swelling of Extremities. Neurological Not Present- Decreased Memory, Fainting, Headaches, Numbness, Seizures, Tingling, Tremor, Trouble walking and Weakness. Psychiatric Not Present- Anxiety, Bipolar, Change in Sleep Pattern, Depression, Fearful and Frequent crying. Endocrine Not Present- Cold Intolerance, Excessive Hunger, Hair Changes, Heat Intolerance, Hot flashes and New Diabetes. Hematology Not Present- Blood Thinners, Easy Bruising, Excessive bleeding, Gland problems, HIV and Persistent Infections.  Vitals  Weight: 152.8 lb Height: 61in Body Surface Area: 1.68 m Body Mass Index: 28.87 kg/m  Temp.: 98.29F  Pulse: 83 (Regular)  BP: 130/80 (Sitting, Left Arm, Standard)     Physical Exam  General Mental Status-Alert. General Appearance-Consistent with stated age. Hydration-Well hydrated. Voice-Normal.  Head and Neck Head-normocephalic, atraumatic with no lesions or palpable masses. Trachea-midline. Thyroid Gland Characteristics - normal size and consistency.  Eye Eyeball - Bilateral-Extraocular movements intact. Sclera/Conjunctiva - Bilateral-No scleral icterus.  Chest and Lung  Exam Chest and lung exam reveals -quiet, even and easy respiratory effort with no use of accessory muscles and on auscultation, normal breath sounds, no adventitious sounds and normal vocal resonance. Inspection Chest Wall - Normal. Back - normal.  Cardiovascular Cardiovascular examination reveals -normal heart sounds, regular rate and rhythm with no murmurs and normal pedal pulses bilaterally.  Abdomen Inspection  Inspection of the abdomen reveals: Note: Examined supine and standing. There is a reducible bulge in the midline above the umbilicus. This is only 2 or 3 cm above the umbilicus. There is no defect at the base of the umbilicus and no protrusion at the base of the umbilicus. No other defects in the midline. Right flank scar well-healed. No inguinal hernia noted. Skin - Scar - no surgical scars. Palpation/Percussion Palpation and Percussion of the abdomen reveal - Soft, Non Tender, No Rebound tenderness, No Rigidity (guarding) and No hepatosplenomegaly. Auscultation Auscultation of the abdomen reveals - Bowel sounds normal.  Neurologic Neurologic evaluation reveals -alert and oriented x 3 with no impairment of recent or remote memory. Mental Status-Normal.  Musculoskeletal Normal Exam - Left-Upper Extremity Strength Normal and Lower Extremity Strength Normal. Normal Exam - Right-Upper Extremity Strength Normal and Lower Extremity Strength Normal.  Lymphatic Head & Neck  General Head & Neck Lymphatics: Bilateral - Description - Normal. Axillary  General Axillary Region: Bilateral - Description - Normal. Tenderness - Non Tender. Femoral & Inguinal  Generalized Femoral & Inguinal Lymphatics: Bilateral - Description - Normal. Tenderness - Non Tender.    Assessment & Plan  EPIGASTRIC HERNIA (K43.9)  You have a midline hernia above your umbilicus. This may be an epigastric hernia or a variant of umbilical hernia Your symptoms have been progressive, and you  request repair at this time. This is reasonable  you will be scheduled for laparoscopic repair of epigastric hernia with  mesh, possible open repair in the future We have discussed the indications, techniques, and risk of the surgery in detail  you will be out of work for 2 weeks but no heavy lifting or sports for 5 weeks  Please read over the patient information booklets  HYPERTENSION, ESSENTIAL (I10) IRRITABLE BOWEL SYNDROME (K58.9)   Angelia MouldHaywood M. Derrell LollingIngram, M.D., [facility] General and Minimally invasive Surgery Breast and Colorectal Surgery Office:   [phone] Pager:   [phone]

## 2017-01-09 NOTE — Anesthesia Preprocedure Evaluation (Addendum)
Anesthesia Evaluation  Patient identified by MRN, date of birth, ID band Patient awake    Reviewed: Allergy & Precautions, NPO status , Patient's Chart, lab work & pertinent test results  History of Anesthesia Complications Negative for: history of anesthetic complications  Airway Mallampati: II  TM Distance: >3 FB Neck ROM: Full    Dental  (+) Teeth Intact, Dental Advisory Given   Pulmonary asthma ,    breath sounds clear to auscultation       Cardiovascular hypertension, Pt. on medications  Rhythm:Regular Rate:Normal     Neuro/Psych negative neurological ROS     GI/Hepatic GERD  Medicated and Controlled,  Endo/Other    Renal/GU      Musculoskeletal   Abdominal   Peds  Hematology   Anesthesia Other Findings   Reproductive/Obstetrics                            Anesthesia Physical Anesthesia Plan  ASA: III  Anesthesia Plan: General   Post-op Pain Management:    Induction: Intravenous  PONV Risk Score and Plan: 4 or greater and Ondansetron, Dexamethasone, Midazolam, Scopolamine patch - Pre-op and Treatment may vary due to age or medical condition  Airway Management Planned: Oral ETT  Additional Equipment:   Intra-op Plan:   Post-operative Plan: Extubation in OR  Informed Consent: I have reviewed the patients History and Physical, chart, labs and discussed the procedure including the risks, benefits and alternatives for the proposed anesthesia with the patient or authorized representative who has indicated his/her understanding and acceptance.   Dental advisory given  Plan Discussed with: CRNA  Anesthesia Plan Comments:         Anesthesia Quick Evaluation

## 2017-01-10 ENCOUNTER — Ambulatory Visit (HOSPITAL_COMMUNITY): Payer: 59 | Admitting: Anesthesiology

## 2017-01-10 ENCOUNTER — Encounter (HOSPITAL_COMMUNITY)
Admission: RE | Disposition: A | Discharge status: Home / Self Care | Payer: Self-pay | Source: Ambulatory Visit | Attending: General Surgery

## 2017-01-10 ENCOUNTER — Ambulatory Visit (HOSPITAL_COMMUNITY)
Admission: RE | Admit: 2017-01-10 | Discharge: 2017-01-11 | Disposition: A | Discharge status: Home / Self Care | Payer: 59 | Source: Ambulatory Visit | Attending: General Surgery | Admitting: General Surgery

## 2017-01-10 ENCOUNTER — Encounter (HOSPITAL_COMMUNITY): Payer: Self-pay

## 2017-01-10 DIAGNOSIS — Z79899 Other long term (current) drug therapy: Secondary | ICD-10-CM | POA: Diagnosis not present

## 2017-01-10 DIAGNOSIS — K219 Gastro-esophageal reflux disease without esophagitis: Secondary | ICD-10-CM | POA: Insufficient documentation

## 2017-01-10 DIAGNOSIS — K436 Other and unspecified ventral hernia with obstruction, without gangrene: Secondary | ICD-10-CM | POA: Diagnosis present

## 2017-01-10 DIAGNOSIS — I10 Essential (primary) hypertension: Secondary | ICD-10-CM | POA: Diagnosis not present

## 2017-01-10 DIAGNOSIS — K589 Irritable bowel syndrome without diarrhea: Secondary | ICD-10-CM | POA: Insufficient documentation

## 2017-01-10 DIAGNOSIS — Z882 Allergy status to sulfonamides status: Secondary | ICD-10-CM | POA: Diagnosis not present

## 2017-01-10 DIAGNOSIS — K429 Umbilical hernia without obstruction or gangrene: Secondary | ICD-10-CM | POA: Diagnosis not present

## 2017-01-10 HISTORY — DX: Other and unspecified ventral hernia with obstruction, without gangrene: K43.6

## 2017-01-10 HISTORY — PX: VENTRAL HERNIA REPAIR: SHX424

## 2017-01-10 HISTORY — PX: INSERTION OF MESH: SHX5868

## 2017-01-10 LAB — CREATININE, SERUM
Creatinine, Ser: 0.82 mg/dL (ref 0.44–1.00)
GFR calc Af Amer: >60 mL/min (ref >60)
GFR calc non Af Amer: >60 mL/min (ref >60)

## 2017-01-10 LAB — CBC
HCT: 37 % (ref 36.0–46.0)
Hemoglobin: 12 g/dL (ref 12.0–15.0)
MCH: 30.5 pg (ref 26.0–34.0)
MCHC: 32.4 g/dL (ref 30.0–36.0)
MCV: 93.9 fL (ref 78.0–100.0)
PLATELETS: 234 10*3/uL (ref 150–400)
RBC: 3.94 MIL/uL (ref 3.87–5.11)
RDW: 13.7 % (ref 11.5–15.5)
WBC: 11.7 10*3/uL — ABNORMAL HIGH (ref 4.0–10.5)

## 2017-01-10 SURGERY — REPAIR, HERNIA, VENTRAL, LAPAROSCOPIC
Anesthesia: General | Site: Abdomen

## 2017-01-10 MED ORDER — CEFAZOLIN SODIUM-DEXTROSE 2-4 GM/100ML-% IV SOLN
2.0000 g | INTRAVENOUS | Status: AC
  Administered 2017-01-10: 2 g via INTRAVENOUS
  Filled 2017-01-10: qty 100

## 2017-01-10 MED ORDER — TRAMADOL HCL 50 MG PO TABS: 50.0000 mg | ORAL_TABLET | Freq: Four times a day (QID) | ORAL | Status: DC | PRN

## 2017-01-10 MED ORDER — OXYCODONE HCL 5 MG/5ML PO SOLN: 5.0000 mg | Freq: Once | ORAL | Status: DC | PRN

## 2017-01-10 MED ORDER — FLUTICASONE PROPIONATE 50 MCG/ACT NA SUSP
1.0000 | Freq: Two times a day (BID) | NASAL | Status: DC | PRN
  Filled 2017-01-10: qty 16

## 2017-01-10 MED ORDER — CEFAZOLIN SODIUM-DEXTROSE 2-4 GM/100ML-% IV SOLN
INTRAVENOUS | Status: AC
  Administered 2017-01-10: 2 g via INTRAVENOUS
  Filled 2017-01-10: qty 100

## 2017-01-10 MED ORDER — METHOCARBAMOL 500 MG PO TABS
500.0000 mg | ORAL_TABLET | Freq: Four times a day (QID) | ORAL | Status: DC | PRN
  Administered 2017-01-10 – 2017-01-11 (×3): 500 mg via ORAL
  Filled 2017-01-10 (×2): qty 1

## 2017-01-10 MED ORDER — HYDROMORPHONE HCL 1 MG/ML IJ SOLN
0.2500 mg | INTRAMUSCULAR | Status: DC | PRN
  Administered 2017-01-10 (×4): 0.5 mg via INTRAVENOUS

## 2017-01-10 MED ORDER — CHLORHEXIDINE GLUCONATE CLOTH 2 % EX PADS: 6.0000 | MEDICATED_PAD | Freq: Once | CUTANEOUS | Status: DC

## 2017-01-10 MED ORDER — HYDROMORPHONE HCL 1 MG/ML IJ SOLN
1.0000 mg | INTRAMUSCULAR | Status: DC | PRN
  Administered 2017-01-10 (×2): 0.5 mg via INTRAVENOUS
  Administered 2017-01-11: 1 mg via INTRAVENOUS
  Filled 2017-01-10: qty 1

## 2017-01-10 MED ORDER — PHENYLEPHRINE HCL 10 MG/ML IJ SOLN
INTRAMUSCULAR | Status: DC | PRN
  Administered 2017-01-10 (×2): 40 ug via INTRAVENOUS
  Administered 2017-01-10: 80 ug via INTRAVENOUS
  Administered 2017-01-10: 120 ug via INTRAVENOUS
  Administered 2017-01-10: 40 ug via INTRAVENOUS

## 2017-01-10 MED ORDER — HYDROCODONE-ACETAMINOPHEN 5-325 MG PO TABS
ORAL_TABLET | ORAL | Status: AC
  Filled 2017-01-10: qty 1

## 2017-01-10 MED ORDER — MIDAZOLAM HCL 5 MG/5ML IJ SOLN
INTRAMUSCULAR | Status: DC | PRN
  Administered 2017-01-10: 2 mg via INTRAVENOUS

## 2017-01-10 MED ORDER — ONDANSETRON 4 MG PO TBDP: 4.0000 mg | ORAL_TABLET | Freq: Four times a day (QID) | ORAL | Status: DC | PRN

## 2017-01-10 MED ORDER — PROPOFOL 10 MG/ML IV BOLUS
INTRAVENOUS | Status: AC
  Filled 2017-01-10: qty 20

## 2017-01-10 MED ORDER — IRBESARTAN 75 MG PO TABS
75.0000 mg | ORAL_TABLET | Freq: Every day | ORAL | Status: DC
  Administered 2017-01-10: 75 mg via ORAL
  Filled 2017-01-10 (×2): qty 1

## 2017-01-10 MED ORDER — OXYCODONE HCL 5 MG PO TABS: 5.0000 mg | ORAL_TABLET | Freq: Once | ORAL | Status: DC | PRN

## 2017-01-10 MED ORDER — ONDANSETRON HCL 4 MG/2ML IJ SOLN
4.0000 mg | Freq: Four times a day (QID) | INTRAMUSCULAR | Status: DC | PRN
  Administered 2017-01-10: 4 mg via INTRAVENOUS

## 2017-01-10 MED ORDER — ONDANSETRON HCL 4 MG/2ML IJ SOLN
INTRAMUSCULAR | Status: AC
  Filled 2017-01-10: qty 2

## 2017-01-10 MED ORDER — PHENYLEPHRINE 40 MCG/ML (10ML) SYRINGE FOR IV PUSH (FOR BLOOD PRESSURE SUPPORT)
PREFILLED_SYRINGE | INTRAVENOUS | Status: AC
  Filled 2017-01-10: qty 10

## 2017-01-10 MED ORDER — LEVOCETIRIZINE DIHYDROCHLORIDE 5 MG PO TABS: 5.0000 mg | ORAL_TABLET | Freq: Every evening | ORAL | Status: DC | PRN

## 2017-01-10 MED ORDER — DEXAMETHASONE SODIUM PHOSPHATE 10 MG/ML IJ SOLN
INTRAMUSCULAR | Status: AC
  Filled 2017-01-10: qty 1

## 2017-01-10 MED ORDER — SUGAMMADEX SODIUM 200 MG/2ML IV SOLN
INTRAVENOUS | Status: DC | PRN
  Administered 2017-01-10: 150 mg via INTRAVENOUS

## 2017-01-10 MED ORDER — SCOPOLAMINE 1 MG/3DAYS TD PT72
MEDICATED_PATCH | TRANSDERMAL | Status: AC
  Filled 2017-01-10: qty 1

## 2017-01-10 MED ORDER — LIDOCAINE 2% (20 MG/ML) 5 ML SYRINGE
INTRAMUSCULAR | Status: AC
  Filled 2017-01-10: qty 5

## 2017-01-10 MED ORDER — SENNA 8.6 MG PO TABS
1.0000 | ORAL_TABLET | Freq: Two times a day (BID) | ORAL | Status: DC
  Administered 2017-01-10 – 2017-01-11 (×2): 8.6 mg via ORAL
  Filled 2017-01-10 (×2): qty 1

## 2017-01-10 MED ORDER — POTASSIUM CHLORIDE 2 MEQ/ML IV SOLN
INTRAVENOUS | Status: DC
  Administered 2017-01-10: 18:00:00 via INTRAVENOUS
  Filled 2017-01-10 (×4): qty 1000

## 2017-01-10 MED ORDER — CEFAZOLIN SODIUM-DEXTROSE 2-4 GM/100ML-% IV SOLN
2.0000 g | Freq: Three times a day (TID) | INTRAVENOUS | Status: AC
  Administered 2017-01-10: 2 g via INTRAVENOUS

## 2017-01-10 MED ORDER — HYDROMORPHONE HCL 1 MG/ML IJ SOLN
INTRAMUSCULAR | Status: AC
  Administered 2017-01-10: 0.5 mg via INTRAVENOUS
  Filled 2017-01-10: qty 1

## 2017-01-10 MED ORDER — LACTATED RINGERS IV SOLN
INTRAVENOUS | Status: DC | PRN
  Administered 2017-01-10 (×2): via INTRAVENOUS

## 2017-01-10 MED ORDER — PROPOFOL 10 MG/ML IV BOLUS
INTRAVENOUS | Status: DC | PRN
  Administered 2017-01-10: 150 mg via INTRAVENOUS

## 2017-01-10 MED ORDER — SCOPOLAMINE 1 MG/3DAYS TD PT72
MEDICATED_PATCH | TRANSDERMAL | Status: DC | PRN
  Administered 2017-01-10: 1 via TRANSDERMAL

## 2017-01-10 MED ORDER — FENTANYL CITRATE (PF) 100 MCG/2ML IJ SOLN
INTRAMUSCULAR | Status: DC | PRN
  Administered 2017-01-10: 50 ug via INTRAVENOUS
  Administered 2017-01-10: 25 ug via INTRAVENOUS
  Administered 2017-01-10: 75 ug via INTRAVENOUS
  Administered 2017-01-10 (×2): 50 ug via INTRAVENOUS

## 2017-01-10 MED ORDER — EPHEDRINE 5 MG/ML INJ
INTRAVENOUS | Status: AC
  Filled 2017-01-10: qty 10

## 2017-01-10 MED ORDER — BUPIVACAINE-EPINEPHRINE 0.25% -1:200000 IJ SOLN
INTRAMUSCULAR | Status: DC | PRN
Start: 2017-01-10 — End: 2017-01-10
  Administered 2017-01-10: 12 mL

## 2017-01-10 MED ORDER — METHOCARBAMOL 500 MG PO TABS
ORAL_TABLET | ORAL | Status: AC
  Filled 2017-01-10: qty 1

## 2017-01-10 MED ORDER — 0.9 % SODIUM CHLORIDE (POUR BTL) OPTIME
TOPICAL | Status: DC | PRN
  Administered 2017-01-10: 1000 mL

## 2017-01-10 MED ORDER — HYDROMORPHONE HCL 1 MG/ML IJ SOLN: 0.2500 mg | INTRAMUSCULAR | Status: DC | PRN

## 2017-01-10 MED ORDER — FENTANYL CITRATE (PF) 250 MCG/5ML IJ SOLN
INTRAMUSCULAR | Status: AC
  Filled 2017-01-10: qty 5

## 2017-01-10 MED ORDER — ENOXAPARIN SODIUM 40 MG/0.4ML ~~LOC~~ SOLN
40.0000 mg | SUBCUTANEOUS | Status: DC
  Administered 2017-01-11: 40 mg via SUBCUTANEOUS
  Filled 2017-01-10: qty 0.4

## 2017-01-10 MED ORDER — ONDANSETRON HCL 4 MG/2ML IJ SOLN
INTRAMUSCULAR | Status: DC | PRN
  Administered 2017-01-10: 4 mg via INTRAVENOUS

## 2017-01-10 MED ORDER — HYDROCODONE-ACETAMINOPHEN 5-325 MG PO TABS
1.0000 | ORAL_TABLET | ORAL | Status: DC | PRN
  Administered 2017-01-10 – 2017-01-11 (×4): 1 via ORAL
  Administered 2017-01-11: 2 via ORAL
  Filled 2017-01-10: qty 2
  Filled 2017-01-10: qty 1
  Filled 2017-01-10: qty 2
  Filled 2017-01-10 (×2): qty 1

## 2017-01-10 MED ORDER — DEXAMETHASONE SODIUM PHOSPHATE 10 MG/ML IJ SOLN
INTRAMUSCULAR | Status: DC | PRN
  Administered 2017-01-10: 10 mg via INTRAVENOUS

## 2017-01-10 MED ORDER — SUGAMMADEX SODIUM 200 MG/2ML IV SOLN
INTRAVENOUS | Status: AC
  Filled 2017-01-10: qty 2

## 2017-01-10 MED ORDER — LIDOCAINE HCL (CARDIAC) 20 MG/ML IV SOLN
INTRAVENOUS | Status: DC | PRN
  Administered 2017-01-10: 100 mg via INTRAVENOUS

## 2017-01-10 MED ORDER — HYOSCYAMINE SULFATE 0.125 MG SL SUBL: 0.1250 mg | SUBLINGUAL_TABLET | Freq: Three times a day (TID) | SUBLINGUAL | Status: DC | PRN

## 2017-01-10 MED ORDER — ROCURONIUM BROMIDE 100 MG/10ML IV SOLN
INTRAVENOUS | Status: DC | PRN
  Administered 2017-01-10: 5 mg via INTRAVENOUS
  Administered 2017-01-10: 10 mg via INTRAVENOUS
  Administered 2017-01-10: 35 mg via INTRAVENOUS

## 2017-01-10 MED ORDER — ROCURONIUM BROMIDE 10 MG/ML (PF) SYRINGE
PREFILLED_SYRINGE | INTRAVENOUS | Status: AC
  Filled 2017-01-10: qty 5

## 2017-01-10 MED ORDER — ACETAMINOPHEN 325 MG PO TABS: 650.0000 mg | ORAL_TABLET | Freq: Four times a day (QID) | ORAL | Status: DC | PRN

## 2017-01-10 MED ORDER — MIDAZOLAM HCL 2 MG/2ML IJ SOLN
INTRAMUSCULAR | Status: AC
  Filled 2017-01-10: qty 2

## 2017-01-10 SURGICAL SUPPLY — 46 items
ADH SKN CLS APL DERMABOND .7 (GAUZE/BANDAGES/DRESSINGS) ×1
APPLIER CLIP LOGIC TI 5 (MISCELLANEOUS) IMPLANT
APR CLP MED LRG 33X5 (MISCELLANEOUS)
BAG SPEC RTRVL LRG 6X4 10 (ENDOMECHANICALS) ×1
BLADE CLIPPER SURG (BLADE) IMPLANT
CANISTER SUCT 3000ML PPV (MISCELLANEOUS) IMPLANT
CHLORAPREP W/TINT 26ML (MISCELLANEOUS) ×2 IMPLANT
COVER SURGICAL LIGHT HANDLE (MISCELLANEOUS) ×2 IMPLANT
DECANTER SPIKE VIAL GLASS SM (MISCELLANEOUS) ×2 IMPLANT
DERMABOND ADVANCED (GAUZE/BANDAGES/DRESSINGS) ×1
DERMABOND ADVANCED .7 DNX12 (GAUZE/BANDAGES/DRESSINGS) ×1 IMPLANT
DEVICE SECURE STRAP 25 ABSORB (INSTRUMENTS) ×4 IMPLANT
DEVICE TROCAR PUNCTURE CLOSURE (ENDOMECHANICALS) ×2 IMPLANT
DRAPE LAPAROSCOPIC ABDOMINAL (DRAPES) ×2 IMPLANT
ELECT REM PT RETURN 9FT ADLT (ELECTROSURGICAL) ×2
ELECTRODE REM PT RTRN 9FT ADLT (ELECTROSURGICAL) ×1 IMPLANT
GLOVE EUDERMIC 7 POWDERFREE (GLOVE) ×2 IMPLANT
GOWN STRL REUS W/ TWL LRG LVL3 (GOWN DISPOSABLE) ×2 IMPLANT
GOWN STRL REUS W/ TWL XL LVL3 (GOWN DISPOSABLE) ×1 IMPLANT
GOWN STRL REUS W/TWL LRG LVL3 (GOWN DISPOSABLE) ×4
GOWN STRL REUS W/TWL XL LVL3 (GOWN DISPOSABLE) ×2
KIT BASIN OR (CUSTOM PROCEDURE TRAY) ×2 IMPLANT
KIT ROOM TURNOVER OR (KITS) ×2 IMPLANT
MARKER SKIN DUAL TIP RULER LAB (MISCELLANEOUS) ×2 IMPLANT
MESH VENTRALIGHT ST 6IN CRC (Mesh General) ×1 IMPLANT
NDL INSUFFLATION 14GA 120MM (NEEDLE) IMPLANT
NDL SPNL 22GX3.5 QUINCKE BK (NEEDLE) ×1 IMPLANT
NEEDLE INSUFFLATION 14GA 120MM (NEEDLE) ×2 IMPLANT
NEEDLE SPNL 22GX3.5 QUINCKE BK (NEEDLE) ×2 IMPLANT
NS IRRIG 1000ML POUR BTL (IV SOLUTION) ×2 IMPLANT
PAD ARMBOARD 7.5X6 YLW CONV (MISCELLANEOUS) ×4 IMPLANT
POUCH SPECIMEN RETRIEVAL 10MM (ENDOMECHANICALS) ×1 IMPLANT
SCISSORS LAP 5X35 DISP (ENDOMECHANICALS) ×1 IMPLANT
SET IRRIG TUBING LAPAROSCOPIC (IRRIGATION / IRRIGATOR) IMPLANT
SHEARS HARMONIC ACE PLUS 36CM (ENDOMECHANICALS) ×1 IMPLANT
SLEEVE ENDOPATH XCEL 5M (ENDOMECHANICALS) ×4 IMPLANT
SUT MNCRL AB 4-0 PS2 18 (SUTURE) ×2 IMPLANT
SUT NOVA NAB DX-16 0-1 5-0 T12 (SUTURE) ×3 IMPLANT
TOWEL OR 17X24 6PK STRL BLUE (TOWEL DISPOSABLE) ×2 IMPLANT
TOWEL OR 17X26 10 PK STRL BLUE (TOWEL DISPOSABLE) ×2 IMPLANT
TRAY FOLEY W/METER SILVER 14FR (SET/KITS/TRAYS/PACK) IMPLANT
TRAY LAPAROSCOPIC MC (CUSTOM PROCEDURE TRAY) ×2 IMPLANT
TROCAR XCEL NON-BLD 11X100MML (ENDOMECHANICALS) IMPLANT
TROCAR XCEL NON-BLD 5MMX100MML (ENDOMECHANICALS) ×2 IMPLANT
TUBING INSUFFLATION (TUBING) ×2 IMPLANT
WATER STERILE IRR 1000ML POUR (IV SOLUTION) IMPLANT

## 2017-01-10 NOTE — Transfer of Care (Signed)
Immediate Anesthesia Transfer of Care Note  Patient: [name] [name]  Procedure(s) Performed: Procedure(s): LAPAROSCOPIC REPAIR EPIGASTRIC HERNIA WITH MESH (N/A) INSERTION OF MESH (N/A)  Patient Location: PACU  Anesthesia Type:General  Level of Consciousness: awake, oriented and patient cooperative  Airway & Oxygen Therapy: Patient Spontanous Breathing and Patient connected to nasal cannula oxygen  Post-op Assessment: Report given to RN and Post -op Vital signs reviewed and stable  Post vital signs: Reviewed  Last Vitals:  Vitals:   01/10/17 0619  BP: 133/84  Pulse: 74  Resp: 20  Temp: 36.5 C  SpO2: 98%    Last Pain:  Vitals:   01/10/17 0619  TempSrc: Oral      Patients Stated Pain Goal: 6 (01/10/17 0650)  Complications: No apparent anesthesia complications

## 2017-01-10 NOTE — Op Note (Signed)
Patient Name:           [name] [name]   Date of Surgery:        01/10/2017  Pre op Diagnosis:      Epigastric ventral hernia  Post op Diagnosis:    Incarcerated epigastric ventral hernia  Procedure:                 Laparoscopic repair of incarcerated epigastric ventral hernia with 15 cm diameter ventra-light ST mesh  Surgeon:                     Angelia Mould. Derrell Lolling, M.D., FACS  Assistant:                      OR staff   Indication for Assistant: N/A  Operative Indications:   . This is a very pleasant 51 year old female. 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 She is referred by Dr. Velva Harman for evaluation of supraumbilical hernia that has become symptomatic.       She has noticed some weakness above her umbilicus for many years. This has been complicated by increasing protrusion and pain for 3 months. This first happened 3 months ago when she had a cold and was coughing a lot. It is never truly been incarcerated. It now bulges more and is more painful. She is now wearing an abdominal binder when she goes to work. She wants to have this repaired     Comorbidities include right pyeloplasty for UPJ obstruction with a right flank incision. That is the only abdominal surgery.       Examination revealed a reducible bulge in the midline above the umbilicus. This may be a low-lying epigastric hernia. Really didn't have a mass at the umbilicus. We decided to go ahead with laparoscopic repair of epigastric hernia with mesh, possible open repair. . She agrees with this plan.  Operative Findings:       There was an incarcerated midline hernia above the umbilicus just at the lower end of falciform ligament.  There was a large incarcerated area of pre-peritoneal fat within the hernia that had to be debrided and removed from the abdominal cavity.  The intestinal tract was not involved in the hernia.  The falciform ligament was taken down.  There were no other gross  abnormalities of the abdominal wall liver or viscera.  Procedure in Detail:          Following the induction of general endotracheal anesthesia the abdomen was prepped and draped in a sterile fashion, intravenous antibiotics were given.  Surgical timeout was performed.  0.5% Marcaine with epinephrine was used as local infiltration anesthetic.     I made a small incision in the left subcostal region.  Veress needle was inserted and tested.  Pneumoperitoneum was created at 15 mmHg without difficulty.  The Veress needle was removed and a 5 mm optical trocar was inserted without difficulty.  Video camera was inserted with visualization findings as described above.  An 11 mm trochar was  placed in the left flank and a 5 mm trocar placed in the left lower quadrant.  Using the harmonic scalpel I debrided the incarcerated contents of the hernia and placed him in a specimen bag and removed them.  The falciform ligament was taken down all the way up to the subxiphoid area.  There were no other abnormalities.  Using a spinal needle I measured the hernia defect which  was probably about 3 cm or less.  I brought a 15 cm diameter circular piece of ventral light ST composite mesh to the operating field.  I drew a template on the abdominal wall to plan 6 equidistant suture fixation sites.  At the 6 equidistant suture fixation sites I placed #1 Novafil. I was very careful to mark the mesh so the rough side was up toward the abdominal wall and the smooth side down toward the viscera.     The mesh was moistened, rolled up, and inserted.  The mesh was spread out.  At each of the suture fixation sites I made a small puncture wound and using the Endo Close device drew the Novafil sutures through the abdominal wall.  I was careful to take about a 1 cm bite of fascia.  After all 6 of these were placed through the abdominal wall I lifted thim up and the mesh deployed nicely with very little redundancy.  All 6 sutures were tied.  I then  further secured the mesh in a double crown technique with the secure strap device.  I put trochars on the right side to visualize and accomplish this.  The repair was visualized on the right and the left side several times and seemed to be quite secure with no more than 1 cm between each fixation site.  There was no active bleeding.  4 quadrant inspection was good there is no evidence of any visceral injury.  The trochars were removed.  The pneumoperitoneum was released.  The skin incisions were closed with 4-0 Monocryl sutures and Dermabond.  The patient tolerated the procedure well.  Abdominal binder was placed.  She was taken to the PACU in stable condition.  EBL 20 mL or less.  Counts correct.  Complications none.    Angelia MouldHaywood M. Derrell LollingIngram, M.D., FACS General and Minimally Invasive Surgery Breast and Colorectal Surgery   Addendum: I logged [facility] the [facility] website and reviewed her prescription medication history.  01/10/2017 10:05 AM

## 2017-01-10 NOTE — Anesthesia Procedure Notes (Signed)
Procedure Name: Intubation Date/Time: 01/10/2017 8:13 AM Performed by: Lovie CholOCK, Gearldean Lomanto K Pre-anesthesia Checklist: Patient identified, Emergency Drugs available, Suction available and Patient being monitored Patient Re-evaluated:Patient Re-evaluated prior to induction Oxygen Delivery Method: Circle System Utilized Preoxygenation: Pre-oxygenation with 100% oxygen Induction Type: IV induction Ventilation: Mask ventilation without difficulty Laryngoscope Size: Miller and 2 Grade View: Grade I Tube type: Oral Tube size: 7.0 mm Number of attempts: 1 Airway Equipment and Method: Stylet and Oral airway Placement Confirmation: ETT inserted through vocal cords under direct vision,  positive ETCO2 and breath sounds checked- equal and bilateral Secured at: 22 cm Tube secured with: Tape Dental Injury: Teeth and Oropharynx as per pre-operative assessment

## 2017-01-10 NOTE — Progress Notes (Signed)
Patient arrived to 6n19, alert and oriented, mild pain to mid abdomen. VSS, IV fluids infusing,SCD's on. Patient noted to have 11 surgical lap sites on abdomen with skin glue, all clean/dry/intact, and abdominal binder. Able to ambulate to bathroom and void. Oriented to room and staff, will continue to monitor.

## 2017-01-10 NOTE — Interval H&P Note (Signed)
History and Physical Interval Note:  01/10/2017 6:50 AM  Gregary SignsSara H Ask  has presented today for surgery, with the diagnosis of Eigastric Hernia  The various methods of treatment have been discussed with the patient and family. After consideration of risks, benefits and other options for treatment, the patient has consented to  Procedure(s): LAPAROSCOPIC REPAIR EPIGASTRIC HERNIA WITH MESH (N/A) INSERTION OF MESH (N/A) as a surgical intervention .  The patient's history has been reviewed, patient examined, no change in status, stable for surgery.  I have reviewed the patient's chart and labs.  Questions were answered to the patient's satisfaction.     Ernestene MentionINGRAM,Tiarah Shisler M

## 2017-01-10 NOTE — Anesthesia Postprocedure Evaluation (Signed)
Anesthesia Post Note  Patient: Gregary SignsSara H Peary  Procedure(s) Performed: Procedure(s) (LRB): LAPAROSCOPIC REPAIR EPIGASTRIC HERNIA WITH MESH (N/A) INSERTION OF MESH (N/A)     Patient location during evaluation: PACU Anesthesia Type: General Level of consciousness: awake and alert Pain management: pain level controlled Vital Signs Assessment: post-procedure vital signs reviewed and stable Respiratory status: spontaneous breathing, nonlabored ventilation, respiratory function stable and patient connected to nasal cannula oxygen Cardiovascular status: blood pressure returned to baseline and stable Postop Assessment: no signs of nausea or vomiting Anesthetic complications: no    Last Vitals:  Vitals:   01/10/17 1415 01/10/17 1515  BP:  112/70  Pulse:  67  Resp: 16 20  Temp:    SpO2: 95% 94%    Last Pain:  Vitals:   01/10/17 1515  TempSrc:   PainSc: 5                  Glendi Mohiuddin,JAMES TERRILL

## 2017-01-11 ENCOUNTER — Encounter (HOSPITAL_COMMUNITY): Payer: Self-pay | Admitting: General Surgery

## 2017-01-11 DIAGNOSIS — K436 Other and unspecified ventral hernia with obstruction, without gangrene: Secondary | ICD-10-CM | POA: Diagnosis not present

## 2017-01-11 DIAGNOSIS — Z882 Allergy status to sulfonamides status: Secondary | ICD-10-CM | POA: Diagnosis not present

## 2017-01-11 DIAGNOSIS — Z79899 Other long term (current) drug therapy: Secondary | ICD-10-CM | POA: Diagnosis not present

## 2017-01-11 DIAGNOSIS — K589 Irritable bowel syndrome without diarrhea: Secondary | ICD-10-CM | POA: Diagnosis not present

## 2017-01-11 DIAGNOSIS — I10 Essential (primary) hypertension: Secondary | ICD-10-CM | POA: Diagnosis not present

## 2017-01-11 DIAGNOSIS — K219 Gastro-esophageal reflux disease without esophagitis: Secondary | ICD-10-CM | POA: Diagnosis not present

## 2017-01-11 MED ORDER — HYDROCODONE-ACETAMINOPHEN 5-325 MG PO TABS: 1.0000 | ORAL_TABLET | Freq: Four times a day (QID) | ORAL | 0 refills | Status: DC | PRN

## 2017-01-11 MED FILL — HYDROCODON-APAP 5-325: 5-325 | 2 days supply | Qty: 20 | Fill #0

## 2017-01-11 NOTE — Discharge Summary (Signed)
Patient ID: [name] [name] [id number] 51 y.o. 1966/03/04  Admit date: 01/10/2017  Discharge date and time: 01/11/2017  Admitting Physician: Ernestene Mention  Discharge Physician: Ernestene Mention  Admission Diagnoses: Epigastric Hernia  Discharge Diagnoses: incarcerated epigastric hernia                                         Hypertension, essential                                         Irritable bowel syndrome  Operations: Procedure(s): LAPAROSCOPIC REPAIR INCARCERATED EPIGASTRIC HERNIA WITH MESH INSERTION OF MESH  Admission Condition: good  Discharged Condition: good  Indication for Admission: This is a very pleasant 51 year old female. 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 She is referred by Dr. Velva Harman for evaluation of supraumbilical hernia that has become symptomatic. She has noticed some weakness above her umbilicus for many years. This has been complicated by increasing protrusion and pain for 3 months. This first happened 3 months ago when she had a cold and was coughing a lot. It is never truly been incarcerated. It now bulges more and is more painful. She is now wearing an abdominal binder when she goes to work. She wants to have this repaired Comorbidities include right pyeloplasty for UPJ obstruction with a right flank incision. That is the only abdominal surgery.  Examination revealed a reducible bulge in the midline above the umbilicus. This may be a low-lying epigastric hernia. Really didn't have a mass at the umbilicus. We decided to go ahead with laparoscopic repair of epigastric hernia with mesh, possible open repair. . She agrees with this plan.  Hospital Course: on the day of admission she was taken to the operating room and underwent laparoscopic repair of incarcerated epigastric hernia with mesh.  Operative findings were there was an incarcerated midline hernia above the umbilicus just at the lower end  of falciform ligament.  There was a large incarcerated area of pre-peritoneal fat within the hernia that had to be debrided and removed from the abdominal cavity.  The intestinal tract was not involved in the hernia.  The falciform ligament was taken down.  There were no other gross abnormalities of the abdominal wall liver or viscera. The hernia was repaired with a 15 cm diameter ventra-light ST inlay mesh.     He was admitted overnight for observation and did well.  On postop day 1 she felt well and felt ready to go home.  She had tolerated solid diet.  She was ambulating to the bathroom and voiding without difficulty.  Pain was controlled.  No nausea.  No abdominal distention.  Examination on postop day 1 revealed she was alert and in no obvious distress.  Abdomen was soft.  Appropriate incisional tenderness.  No distention .  Wounds looked good.  Bowel sounds present.      She was given instruction in diet and activities.  She was given a prescription for Norco for pain.  She was asked to return to see me in the office in approximately 3 weeks.  Consults: None  Significant Diagnostic Studies: none  Treatments: surgery: laparoscopic repair of incarcerated epigastric hernia with mesh  Disposition: Home  Patient Instructions:  Allergies as of 01/11/2017  Reactions   Sulfonamide Derivatives Rash   Blisters in mucous membranes      Medication List    TAKE these medications   acetaminophen 325 MG tablet Commonly known as:  TYLENOL Take 650 mg by mouth every 6 (six) hours as needed for headache.   famotidine 20 MG tablet Commonly known as:  PEPCID One at bedtime   fluticasone 50 MCG/ACT nasal spray Commonly known as:  FLONASE One spray in each nostril twice a day, use left hand for right nostril, and right hand for left nostril. What changed:  how much to take  how to take this  when to take this  reasons to take this  additional instructions   HYDROcodone-acetaminophen  5-325 MG tablet Commonly known as:  NORCO/VICODIN Take 1-2 tablets by mouth every 6 (six) hours as needed for moderate pain.   hyoscyamine 0.125 MG SL tablet Commonly known as:  LEVSIN SL Place 0.125 mg under the tongue 3 (three) times daily as needed (abdominal cramping).   meloxicam 15 MG tablet Commonly known as:  MOBIC One tab PO qAM with breakfast. What changed:  how much to take  how to take this  when to take this  reasons to take this  additional instructions   valsartan 80 MG tablet Commonly known as:  DIOVAN Take 1 tablet (80 mg total) by mouth daily. What changed:  when to take this   XYZAL 5 MG tablet Generic drug:  levocetirizine Take 5 mg by mouth at bedtime as needed (seasonal allergies).            Discharge Care Instructions        Start     Ordered   01/11/17 0000  HYDROcodone-acetaminophen (NORCO/VICODIN) 5-325 MG tablet  Every 6 hours PRN     01/11/17 0717   01/11/17 0000  Increase activity slowly     01/11/17 0717   01/11/17 0000  Diet - low sodium heart healthy     01/11/17 0717   01/11/17 0000  Discharge instructions    Comments:  Take Senokot or similar stool softener or laxative twice a day until bowel movements are normalizing  Take a daily walk  Drink lots of fluids and stay well hydrated  [facility] ______[facility] LAPAROSCOPIC SURGERY: POST OP INSTRUCTIONS Always review your discharge instruction sheet given to you by the facility where your surgery was performed. IF YOU HAVE DISABILITY OR FAMILY LEAVE FORMS, YOU MUST BRING THEM TO THE OFFICE FOR PROCESSING.   DO NOT GIVE THEM TO YOUR DOCTOR.  A prescription for pain medication may be given to you upon discharge.  Take your pain medication as prescribed, if needed.  If narcotic pain medicine is not needed, then you may take acetaminophen (Tylenol) or ibuprofen (Advil) as needed. Take your usually prescribed medications unless otherwise directed. If you need a  refill on your pain medication, please contact your pharmacy.  They will contact our office to request authorization. Prescriptions will not be filled after 5pm or on week-ends. You should follow a light diet the first few days after arrival home, such as soup and crackers, etc.  Be sure to include lots of fluids daily. Most patients will experience some swelling and bruising in the area of the incisions.  Ice packs will help.  Swelling and bruising can take several days to resolve.  It is common to experience some constipation if taking pain medication after surgery.  Increasing fluid intake and taking a stool softener (  such as Colace) will usually help or prevent this problem from occurring.  A mild laxative (Milk of Magnesia or Miralax) should be taken according to package instructions if there are no bowel movements after 48 hours. Unless discharge instructions indicate otherwise, you may remove your bandages 24-48 hours after surgery, and you may shower at that time.  You may have steri-strips (small skin tapes) in place directly over the incision.  These strips should be left on the skin for 7-10 days.  If your surgeon used skin glue on the incision, you may shower in 24 hours.  The glue will flake off over the next 2-3 weeks.  Any sutures or staples will be removed at the office during your follow-up visit. ACTIVITIES:  You may resume regular (light) daily activities beginning the next day-such as daily self-care, walking, climbing stairs-gradually increasing activities as tolerated.  You may have sexual intercourse when it is comfortable.  Refrain from any heavy lifting or straining until approved by your doctor. You may drive when you are no longer taking prescription pain medication, you can comfortably wear a seatbelt, and you can safely maneuver your car and apply brakes. RETURN TO WORK:  __________________________________________________________ Bonita Quin should see your doctor in the office for a  follow-up appointment approximately 2-3 weeks after your surgery.  Make sure that you call for this appointment within a day or two after you arrive home to insure a convenient appointment time. OTHER INSTRUCTIONS: __________________________________________________________________________________________________________________________ __________________________________________________________________________________________________________________________ WHEN TO CALL YOUR DOCTOR: Fever over 101.0 Inability to urinate Continued bleeding from incision. Increased pain, redness, or drainage from the incision. Increasing abdominal pain  The clinic staff is available to answer your questions during regular business hours.  Please don't hesitate to call and ask to speak to one of the nurses for clinical concerns.  If you have a medical emergency, go to the nearest emergency room or call 911.  A surgeon from [facility] is always on call at the hospital. [address], [place], [place]  [address] ? [address] [address], [place], [place]   [address] [phone] ? [phone] ? FAX [phone] Web site: [email or web address]   01/11/17 0717   01/11/17 0000  May walk up steps     01/11/17 0717   01/11/17 0000  May shower / [place]     01/11/17 0717   01/11/17 0000  Driving Restrictions    Comments:  approx 1 week   01/11/17 0717   01/11/17 0000  Lifting restrictions    Comments:  No sports or lifting more than 20 pounds for 5 weeks   01/11/17 0717   01/11/17 0000  Discharge wound care:    Comments:  Basically you do not need any wound care.  The Dermabond should wear off in 3 weeks Use the abdominal binder for comfort  Okay to shower but avoid tub or hot tub for about 4 weeks   01/11/17 0717   01/11/17 0000  Call MD for:  persistant dizziness or light-headedness     01/11/17 0717   01/11/17 0000  Call MD for:  hives     01/11/17 0717   01/11/17 0000  Call  MD for:  difficulty breathing, headache or visual disturbances     01/11/17 0717   01/11/17 0000  Call MD for:  redness, tenderness, or signs of infection (pain, swelling, redness, odor or green/yellow discharge around incision site)     01/11/17 0717   01/11/17 0000  Call MD for:  severe uncontrolled pain     01/11/17 0717   01/11/17 0000  Call MD for:  persistant nausea and vomiting     01/11/17 0717   01/11/17 0000  Call MD for:  temperature >100.4     01/11/17 0717      Activity: frequent ambulation.  No sports or lifting for 5 weeks Diet: low fat, low cholesterol diet Wound Care: none needed  Follow-up:  With Dr. Derrell Lolling in 3 weeks    Addendum: I logged onto the [facility] website and reviewed her prescription medication history..  SignedAngelia Mould. Derrell Lolling, M.D., FACS General and minimally invasive surgery Breast and Colorectal Surgery  01/11/2017, 7:22 AM

## 2017-01-11 NOTE — Discharge Instructions (Signed)
[facility] ______[facility] °LAPAROSCOPIC SURGERY: POST OP INSTRUCTIONS °Always review your discharge instruction sheet given to you by the facility where your surgery was performed. °IF YOU HAVE DISABILITY OR FAMILY LEAVE FORMS, YOU MUST BRING THEM TO THE OFFICE FOR PROCESSING.   °DO NOT GIVE THEM TO YOUR DOCTOR. ° °1. A prescription for pain medication may be given to you upon discharge.  Take your pain medication as prescribed, if needed.  If narcotic pain medicine is not needed, then you may take acetaminophen (Tylenol) or ibuprofen (Advil) as needed. °2. Take your usually prescribed medications unless otherwise directed. °3. If you need a refill on your pain medication, please contact your pharmacy.  They will contact our office to request authorization. Prescriptions will not be filled after 5pm or on week-ends. °4. You should follow a light diet the first few days after arrival home, such as soup and crackers, etc.  Be sure to include lots of fluids daily. °5. Most patients will experience some swelling and bruising in the area of the incisions.  Ice packs will help.  Swelling and bruising can take several days to resolve.  °6. It is common to experience some constipation if taking pain medication after surgery.  Increasing fluid intake and taking a stool softener (such as Colace) will usually help or prevent this problem from occurring.  A mild laxative (Milk of Magnesia or Miralax) should be taken according to package instructions if there are no bowel movements after 48 hours. °7. Unless discharge instructions indicate otherwise, you may remove your bandages 24-48 hours after surgery, and you may shower at that time.  You may have steri-strips (small skin tapes) in place directly over the incision.  These strips should be left on the skin for 7-10 days.  If your surgeon used skin glue on the incision, you may shower in 24 hours.  The glue will flake off over the next 2-3 weeks.  Any sutures or  staples will be removed at the office during your follow-up visit. °8. ACTIVITIES:  You may resume regular (light) daily activities beginning the next day--such as daily self-care, walking, climbing stairs--gradually increasing activities as tolerated.  You may have sexual intercourse when it is comfortable.  Refrain from any heavy lifting or straining until approved by your doctor. °a. You may drive when you are no longer taking prescription pain medication, you can comfortably wear a seatbelt, and you can safely maneuver your car and apply brakes. °b. RETURN TO WORK:  __________________________________________________________ °9. You should see your doctor in the office for a follow-up appointment approximately 2-3 weeks after your surgery.  Make sure that you call for this appointment within a day or two after you arrive home to insure a convenient appointment time. °10. OTHER INSTRUCTIONS: __________________________________________________________________________________________________________________________ __________________________________________________________________________________________________________________________ °WHEN TO CALL YOUR DOCTOR: °1. Fever over 101.0 °2. Inability to urinate °3. Continued bleeding from incision. °4. Increased pain, redness, or drainage from the incision. °5. Increasing abdominal pain ° °The clinic staff is available to answer your questions during regular business hours.  Please don’t hesitate to call and ask to speak to one of the nurses for clinical concerns.  If you have a medical emergency, go to the nearest emergency room or call 911.  A surgeon from [facility] is always on call at the hospital. °[address], [address], [place], [place]  [address] ? [address] [address], , [place]   [address] °[phone] ? [phone] ? FAX [phone] °Web site:   [email or web address] °

## 2017-01-19 ENCOUNTER — Encounter: Payer: Self-pay | Admitting: Sports Medicine

## 2017-01-19 DIAGNOSIS — E876 Hypokalemia: Secondary | ICD-10-CM

## 2017-01-26 MED FILL — HYDROCODON-APAP 5-325: 5-325 | 5 days supply | Qty: 20 | Fill #0

## 2017-01-30 DIAGNOSIS — I10 Essential (primary) hypertension: Secondary | ICD-10-CM | POA: Diagnosis not present

## 2017-01-30 DIAGNOSIS — E876 Hypokalemia: Secondary | ICD-10-CM | POA: Diagnosis not present

## 2017-01-30 LAB — BASIC METABOLIC PANEL
BUN: 11 mg/dL (ref 7–25)
Chloride: 102 mmol/L (ref 98–110)
Creat: 0.74 mg/dL (ref 0.50–1.05)
Glucose, Bld: 83 mg/dL (ref 65–99)

## 2017-01-30 LAB — BASIC METABOLIC PANEL WITH GFR
CO2: 28 mmol/L (ref 20–32)
Calcium: 9.8 mg/dL (ref 8.6–10.4)
Potassium: 4.4 mmol/L (ref 3.5–5.3)
Sodium: 137 mmol/L (ref 135–146)

## 2017-01-30 LAB — TSH: TSH: 1.65 m[IU]/L

## 2017-01-30 LAB — MAGNESIUM: Magnesium: 2.2 mg/dL (ref 1.5–2.5)

## 2017-05-30 ENCOUNTER — Encounter: Payer: Self-pay | Admitting: Sports Medicine

## 2017-05-31 MED ORDER — IRBESARTAN 75 MG PO TABS: 75.0000 mg | ORAL_TABLET | Freq: Every day | ORAL | 3 refills | Status: DC

## 2017-05-31 MED FILL — IRBESARTAN 75 MG TABLET: 75 | 30 days supply | Qty: 30 | Fill #0

## 2017-06-20 ENCOUNTER — Encounter: Payer: Self-pay | Admitting: Sports Medicine

## 2017-06-20 MED ORDER — IRBESARTAN 150 MG PO TABS: 150.0000 mg | ORAL_TABLET | Freq: Every day | ORAL | 0 refills | Status: DC

## 2017-06-29 MED FILL — IRBESARTAN 150 MG TAB: 150 | 30 days supply | Qty: 30 | Fill #0

## 2017-09-07 ENCOUNTER — Other Ambulatory Visit: Payer: Self-pay | Admitting: Sports Medicine

## 2017-09-07 MED FILL — IRBESARTAN 150 MG TABLET: 150 | 30 days supply | Qty: 30 | Fill #0

## 2017-10-24 ENCOUNTER — Other Ambulatory Visit: Payer: Self-pay | Admitting: Sports Medicine

## 2017-10-24 MED FILL — IRBESARTAN 150 MG TABLET: 150 | 30 days supply | Qty: 30 | Fill #0

## 2017-12-27 ENCOUNTER — Ambulatory Visit (INDEPENDENT_AMBULATORY_CARE_PROVIDER_SITE_OTHER): Payer: 59 | Admitting: Sports Medicine

## 2017-12-27 ENCOUNTER — Ambulatory Visit (INDEPENDENT_AMBULATORY_CARE_PROVIDER_SITE_OTHER): Payer: 59

## 2017-12-27 ENCOUNTER — Encounter: Payer: Self-pay | Admitting: Sports Medicine

## 2017-12-27 DIAGNOSIS — M5412 Radiculopathy, cervical region: Secondary | ICD-10-CM

## 2017-12-27 DIAGNOSIS — M542 Cervicalgia: Secondary | ICD-10-CM | POA: Diagnosis not present

## 2017-12-27 DIAGNOSIS — S134XXA Sprain of ligaments of cervical spine, initial encounter: Secondary | ICD-10-CM

## 2017-12-27 DIAGNOSIS — S199XXA Unspecified injury of neck, initial encounter: Secondary | ICD-10-CM | POA: Diagnosis not present

## 2017-12-27 MED ORDER — ALL-BODY MASSAGE MISC: 1.0000 | 11 refills | Status: DC

## 2017-12-27 MED ORDER — CYCLOBENZAPRINE HCL 10 MG PO TABS
ORAL_TABLET | ORAL | 0 refills | Status: DC
Start: 2017-12-27 — End: 2018-03-11

## 2017-12-27 MED ORDER — PREDNISONE 50 MG PO TABS
ORAL_TABLET | ORAL | 0 refills | Status: DC
Start: 2017-12-27 — End: 2018-01-12

## 2017-12-27 MED FILL — CYCLOBENZAPRINE HCL 10 MG T: 10 | 10 days supply | Qty: 30 | Fill #0

## 2017-12-27 MED FILL — predniSONE 50 MG TABS: 50 | 5 days supply | Qty: 5 | Fill #0
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
## 2018-01-10 ENCOUNTER — Ambulatory Visit: Payer: 59 | Admitting: Sports Medicine

## 2018-01-12 ENCOUNTER — Ambulatory Visit (INDEPENDENT_AMBULATORY_CARE_PROVIDER_SITE_OTHER): Payer: 59 | Admitting: Sports Medicine

## 2018-01-12 ENCOUNTER — Encounter: Payer: Self-pay | Admitting: Sports Medicine

## 2018-01-12 DIAGNOSIS — I10 Essential (primary) hypertension: Secondary | ICD-10-CM | POA: Diagnosis not present

## 2018-01-12 DIAGNOSIS — Z0000 Encounter for general adult medical examination without abnormal findings: Secondary | ICD-10-CM

## 2018-01-12 DIAGNOSIS — S134XXD Sprain of ligaments of cervical spine, subsequent encounter: Secondary | ICD-10-CM | POA: Diagnosis not present

## 2018-01-12 NOTE — Assessment & Plan Note (Signed)
Well-controlled on Avapro 150. She is having a bit of orthostasis recently. Increase hydration and liberalize salt in diet for the next few weeks, if persistent symptoms we will decrease Avapro dose.
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
## 2018-01-12 NOTE — Assessment & Plan Note (Signed)
Due for mammogram and colon cancer screening, adding Cologuard and referral for mammogram, because of her breasts that she has to get these done as an ultrasound and mammogram at the breast center.

## 2018-01-12 NOTE — Assessment & Plan Note (Signed)
Pain resolved, still has a bit of left periscapular discomfort but everything is heading in the right direction.

## 2018-01-15 ENCOUNTER — Other Ambulatory Visit: Payer: Self-pay | Admitting: Sports Medicine

## 2018-01-15 MED FILL — IRBESARTAN 150 MG TABLET: 150 | 30 days supply | Qty: 30 | Fill #0

## 2018-03-06 ENCOUNTER — Other Ambulatory Visit: Payer: Self-pay | Admitting: Sports Medicine

## 2018-03-06 DIAGNOSIS — Z1211 Encounter for screening for malignant neoplasm of colon: Secondary | ICD-10-CM | POA: Diagnosis not present

## 2018-03-06 DIAGNOSIS — Z1212 Encounter for screening for malignant neoplasm of rectum: Secondary | ICD-10-CM | POA: Diagnosis not present

## 2018-03-11 ENCOUNTER — Other Ambulatory Visit: Payer: Self-pay | Admitting: Sports Medicine

## 2018-03-11 DIAGNOSIS — S134XXA Sprain of ligaments of cervical spine, initial encounter: Secondary | ICD-10-CM

## 2018-03-13 ENCOUNTER — Other Ambulatory Visit: Payer: Self-pay | Admitting: Sports Medicine

## 2018-03-13 DIAGNOSIS — S134XXA Sprain of ligaments of cervical spine, initial encounter: Secondary | ICD-10-CM

## 2018-03-15 LAB — COLOGUARD: COLOGUARD: NEGATIVE

## 2018-03-19 ENCOUNTER — Encounter: Payer: Self-pay | Admitting: Sports Medicine

## 2018-03-22 MED ORDER — CYCLOBENZAPRINE HCL 10 MG PO TABS: ORAL_TABLET | ORAL | 0 refills | Status: DC

## 2018-03-22 MED FILL — CYCLOBENZAPRINE HCL 10 MG T: 10 | 10 days supply | Qty: 30 | Fill #0

## 2018-03-23 MED FILL — IRBESARTAN 150 MG TABLET: 150 | 30 days supply | Qty: 30 | Fill #0

## 2018-05-10 ENCOUNTER — Other Ambulatory Visit: Payer: Self-pay | Admitting: Sports Medicine

## 2018-05-10 MED FILL — IRBESARTAN 150 MG TAB: 150 | 30 days supply | Qty: 30 | Fill #0

## 2018-06-27 MED FILL — IRBESARTAN 150 MG TAB: 150 | 30 days supply | Qty: 30 | Fill #1

## 2018-07-09 ENCOUNTER — Encounter: Payer: Self-pay | Admitting: Sports Medicine

## 2018-07-09 ENCOUNTER — Ambulatory Visit (INDEPENDENT_AMBULATORY_CARE_PROVIDER_SITE_OTHER): Payer: 59 | Admitting: Sports Medicine

## 2018-07-09 DIAGNOSIS — J4521 Mild intermittent asthma with (acute) exacerbation: Secondary | ICD-10-CM

## 2018-07-09 MED ORDER — PREDNISONE 50 MG PO TABS: 50.0000 mg | ORAL_TABLET | Freq: Every day | ORAL | 0 refills | Status: DC

## 2018-07-09 MED ORDER — ALBUTEROL SULFATE HFA 108 (90 BASE) MCG/ACT IN AERS: 2.0000 | INHALATION_SPRAY | Freq: Four times a day (QID) | RESPIRATORY_TRACT | 11 refills | Status: DC | PRN

## 2018-07-09 MED ORDER — LEVOCETIRIZINE DIHYDROCHLORIDE 5 MG PO TABS: 5.0000 mg | ORAL_TABLET | Freq: Every evening | ORAL | 3 refills | Status: DC

## 2018-07-09 MED FILL — VENTOLIN HFA 90 MCG INHALER: 108 (90 BAS | 50 days supply | Qty: 36 | Fill #0

## 2018-07-09 MED FILL — LEVOCETIRIZINE 5 MG TABLET: 5 | 90 days supply | Qty: 90 | Fill #0

## 2018-07-09 MED FILL — predniSONE 50 MG TABS: 50 | 5 days supply | Qty: 5 | Fill #0

## 2018-07-09 NOTE — Assessment & Plan Note (Signed)
Exquisitely mild exacerbation. She will restart her Xyzal, adding albuterol, prednisone. Return as needed.

## 2018-07-09 NOTE — Progress Notes (Signed)
Subjective:    CC: Asthma  HPI: For the past several days this pleasant 53 year old female has had increasing chest tightness, wheezing, shortness of breath and cough.  No fevers, chills, symptoms are moderate, persistent.  She does not usually need albuterol, typically takes an antihistamine but has not done it yet this year.  She feels as though spring started soon.    I reviewed the past medical history, family history, social history, surgical history, and allergies today and no changes were needed.  Please see the problem list section below in epic for further details.  Past Medical History: Past Medical History:  Diagnosis Date  . Asthma   . Essential hypertension 01/14/2015  . GERD (gastroesophageal reflux disease)   . Hydronephrosis, right    chronic  . IBS (irritable bowel syndrome)   . Incarcerated epigastric hernia 01/10/2017  . Murmur    Past Surgical History: Past Surgical History:  Procedure Laterality Date  . HAND SURGERY     LT 2002  . INSERTION OF MESH N/A 01/10/2017   Procedure: INSERTION OF MESH;  Surgeon: Claud Kelp, MD;  Location: [facility] OR;  Service: General;  Laterality: N/A;  . PYELOPLASTY     RT kidney 1993  . pyleoplasty     right kidney  . TONSILLECTOMY    . VENTRAL HERNIA REPAIR N/A 01/10/2017   Procedure: LAPAROSCOPIC REPAIR EPIGASTRIC HERNIA WITH MESH;  Surgeon: Claud Kelp, MD;  Location: [facility] OR;  Service: General;  Laterality: N/A;   Social History: Social History   Socioeconomic History  . Marital status: Married    Spouse name: Not on file  . Number of children: Not on file  . Years of education: Not on file  . Highest education level: Not on file  Occupational History  . 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 Financial resource strain: Not on file  . Food insecurity:    Worry: Not on file    Inability: Not on file  . Transportation needs:    Medical: Not on file    Non-medical: Not  on file  Tobacco Use  . Smoking status: Never Smoker  . Smokeless tobacco: Never Used  Substance and Sexual Activity  . Alcohol use: Yes    Comment: occasionally  . Drug use: No  . Sexual activity: Yes  Lifestyle  . Physical activity:    Days per week: Not on file    Minutes per session: Not on file  . Stress: Not on file  Relationships  . Social connections:    Talks on phone: Not on file    Gets together: Not on file    Attends religious service: Not on file    Active member of club or organization: Not on file    Attends meetings of clubs or organizations: Not on file    Relationship status: Not on file  Other Topics Concern  . Not on file  Social History Narrative  . Not on file   Family History: Family History  Problem Relation Age of Onset  . Alcohol abuse Father   . Depression Father   . Stroke Father   . Hypertension Father   . Heart attack Father   . Esophageal cancer Father   . Breast cancer Mother        metastatic to lungs  . Heart disease Mother   . Hypertension Mother   . Cancer Mother  breast and lung  . Heart attack Mother   . Mental illness Paternal Aunt    Allergies: Allergies  Allergen Reactions  . Sulfonamide Derivatives Rash    Blisters in mucous membranes   Medications: See med rec.  Review of Systems: No fevers, chills, night sweats, weight loss, chest pain, or shortness of breath.   Objective:    General: Well Developed, well nourished, and in no acute distress.  Neuro: Alert and oriented x3, extra-ocular muscles intact, sensation grossly intact.  HEENT: Normocephalic, atraumatic, pupils equal round reactive to light, neck supple, no masses, no lymphadenopathy, thyroid nonpalpable.  Oropharynx, nasopharynx, ear canals are markable. Skin: Warm and dry, no rashes. Cardiac: Regular rate and rhythm, no murmurs rubs or gallops, no lower extremity edema.  Respiratory: Clear to auscultation bilaterally. Not using accessory muscles,  speaking in full sentences.  Impression and Recommendations:    Asthma Exquisitely mild exacerbation. She will restart her Xyzal, adding albuterol, prednisone. Return as needed.   ___________________________________________ Ihor Austin. Benjamin Stain, M.D., ABFM., CAQSM. Primary Care and Sports Medicine [facility] [facility]  Adjunct Professor of Family Medicine  [employer]

## 2018-07-23 ENCOUNTER — Emergency Department (INDEPENDENT_AMBULATORY_CARE_PROVIDER_SITE_OTHER)
Admission: EM | Admit: 2018-07-23 | Discharge: 2018-07-23 | Disposition: A | Discharge status: Home / Self Care | Payer: 59 | Source: Home / Self Care

## 2018-07-23 ENCOUNTER — Other Ambulatory Visit: Payer: Self-pay

## 2018-07-23 ENCOUNTER — Encounter: Payer: Self-pay | Admitting: Family Medicine

## 2018-07-23 DIAGNOSIS — B029 Zoster without complications: Secondary | ICD-10-CM | POA: Diagnosis not present

## 2018-07-23 MED ORDER — VALACYCLOVIR HCL 1 G PO TABS: 1000.0000 mg | ORAL_TABLET | Freq: Three times a day (TID) | ORAL | 0 refills | Status: AC

## 2018-07-23 MED FILL — valACYclovir HCL 1 GM TABS: 1 | 7 days supply | Qty: 21 | Fill #0
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
## 2018-07-23 NOTE — ED Triage Notes (Signed)
Pt c/o painful rash on the LT side of her neck x 1 day.

## 2018-07-24 ENCOUNTER — Telehealth: Payer: Self-pay

## 2018-07-24 DIAGNOSIS — B0229 Other postherpetic nervous system involvement: Secondary | ICD-10-CM | POA: Insufficient documentation

## 2018-07-24 MED ORDER — PREGABALIN 75 MG PO CAPS: 75.0000 mg | ORAL_CAPSULE | Freq: Two times a day (BID) | ORAL | 3 refills | Status: DC

## 2018-07-24 MED FILL — PREGABALIN 75 MG CAPS: 75 | 30 days supply | Qty: 60 | Fill #0

## 2018-07-24 NOTE — Assessment & Plan Note (Signed)
Seen in urgent care, diagnosed with shingles. Adding Lyrica 75 twice a day.

## 2018-07-24 NOTE — Telephone Encounter (Signed)
Adding Lyrica medium dose.

## 2018-07-24 NOTE — Telephone Encounter (Signed)
[name] [name] was seen by urgent care and diagnosed with shingles. She would like something for the pain. She is unable to sleep due to the pain. Please advise.

## 2018-07-24 NOTE — Telephone Encounter (Signed)
Patient advised.

## 2018-07-26 ENCOUNTER — Encounter: Payer: Self-pay | Admitting: Sports Medicine

## 2018-07-26 ENCOUNTER — Ambulatory Visit (INDEPENDENT_AMBULATORY_CARE_PROVIDER_SITE_OTHER): Payer: 59 | Admitting: Sports Medicine

## 2018-07-26 ENCOUNTER — Other Ambulatory Visit: Payer: Self-pay

## 2018-07-26 DIAGNOSIS — B029 Zoster without complications: Secondary | ICD-10-CM

## 2018-07-26 MED ORDER — PREDNISONE 50 MG PO TABS
ORAL_TABLET | ORAL | 0 refills | Status: DC
Start: 2018-07-26 — End: 2018-10-18

## 2018-07-26 MED ORDER — PREGABALIN 100 MG PO CAPS: 100.0000 mg | ORAL_CAPSULE | Freq: Three times a day (TID) | ORAL | 0 refills | Status: DC

## 2018-07-26 MED FILL — PREGABALIN 100 MG CAPS: 100 | 30 days supply | Qty: 90 | Fill #0

## 2018-07-26 MED FILL — predniSONE 50 MG TABS: 50 | 5 days supply | Qty: 5 | Fill #0

## 2018-07-26 NOTE — Telephone Encounter (Signed)
Called Pt, added on for today.  

## 2018-07-26 NOTE — Progress Notes (Signed)
Virtual Visit via WebEx   I connected with [name] [name] on 07/26/18 by WebEx and verified that I am speaking with the correct person using two identifiers.   I discussed the limitations, risks, security and privacy concerns of performing an evaluation and management service by WebEx, including the higher likelihood of inaccurate diagnosis and treatment, and the availability of in person appointments.  We also discussed the likely need of an additional face to face encounter for complete and high quality delivery of care.  I also discussed with the patient that there may be a patient responsible charge related to this service. The patient expressed understanding and wishes to proceed.  Subjective:    CC: Skin rash  HPI: For the past several days steroid has had a rash on the back of her neck, left side with radiation up to the back of her left ear, and over the left shoulder but nothing down the arms to the hands of the fingertips.  There was no prodrome, fevers, chills, muscle aches, body aches.  She went to urgent care was diagnosed with shingles, started on Valtrex high-dose.  Unfortunately she continues to have severe pain, and the rash continues to extend up the back of her left ear.  She has no ocular involvement, no visual changes.  I reviewed the past medical history, family history, social history, surgical history, and allergies today and no changes were needed.  Please see the problem list section below in epic for further details.  Past Medical History: Past Medical History:  Diagnosis Date  . Asthma   . Essential hypertension 01/14/2015  . GERD (gastroesophageal reflux disease)   . Hydronephrosis, right    chronic  . IBS (irritable bowel syndrome)   . Incarcerated epigastric hernia 01/10/2017  . Murmur    Past Surgical History: Past Surgical History:  Procedure Laterality Date  . HAND SURGERY     LT 2002  . INSERTION OF MESH N/A 01/10/2017   Procedure: INSERTION OF  MESH;  Surgeon: Claud Kelp, MD;  Location: [facility] OR;  Service: General;  Laterality: N/A;  . PYELOPLASTY     RT kidney 1993  . pyleoplasty     right kidney  . TONSILLECTOMY    . VENTRAL HERNIA REPAIR N/A 01/10/2017   Procedure: LAPAROSCOPIC REPAIR EPIGASTRIC HERNIA WITH MESH;  Surgeon: Claud Kelp, MD;  Location: [facility] OR;  Service: General;  Laterality: N/A;   Social History: Social History   Socioeconomic History  . Marital status: Married    Spouse name: Not on file  . Number of children: Not on file  . Years of education: Not on file  . Highest education level: Not on file  Occupational History  . 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 Financial resource strain: Not on file  . Food insecurity:    Worry: Not on file    Inability: Not on file  . Transportation needs:    Medical: Not on file    Non-medical: Not on file  Tobacco Use  . Smoking status: Never Smoker  . Smokeless tobacco: Never Used  Substance and Sexual Activity  . Alcohol use: Yes    Comment: occasionally  . Drug use: No  . Sexual activity: Yes  Lifestyle  . Physical activity:    Days per week: Not on file    Minutes per session: Not on file  . Stress: Not on file  Relationships  . Social connections:    Talks on phone: Not on file    Gets together: Not on file    Attends religious service: Not on file    Active member of club or organization: Not on file    Attends meetings of clubs or organizations: Not on file    Relationship status: Not on file  Other Topics Concern  . Not on file  Social History Narrative  . Not on file   Family History: Family History  Problem Relation Age of Onset  . Alcohol abuse Father   . Depression Father   . Stroke Father   . Hypertension Father   . Heart attack Father   . Esophageal cancer Father   . Breast cancer Mother        metastatic to lungs  . Heart disease Mother   . Hypertension Mother   . Cancer  Mother        breast and lung  . Heart attack Mother   . Mental illness Paternal Aunt    Allergies: Allergies  Allergen Reactions  . Sulfonamide Derivatives Rash    Blisters in mucous membranes   Medications: See med rec.  Review of Systems: No fevers, chills, night sweats, weight loss, chest pain, or shortness of breath.   Objective:    General: Speaking full sentences, no audible heavy breathing.  Sounds alert and appropriately interactive.  No other physical exam performed due to the non-face to face nature of this visit. Skin: Splotchy reddish rash was noted on the left side of the back of the neck rating up behind the left ear, but the resolution was nowhere near good enough to make a diagnosis. Neuro: Face was symmetric.  Impression and Recommendations:    Shingles I was able to see some of the rash but as expected on a camera it is really not clear. Continue Valtrex 3 times a day 1000 mg. Adding prednisone 50 mg daily. Increase Lyrica to 100 mg 3 times per day. She can return to work when the lesions have crusted over, if not feeling significantly better by the end of this coming week I need to see her in person.  I discussed the above assessment and treatment plan with the patient. The patient was provided an opportunity to ask questions and all were answered. The patient agreed with the plan and demonstrated an understanding of the instructions.   The patient was advised to call back or seek an in-person evaluation if the symptoms worsen or if the condition fails to improve as anticipated.   I provided non-face-to-face evaluation and management during this encounter, time was needed to gather information, review chart and records, explain the treatment plan to the patient, and complete documentation.   ___________________________________________ Ihor Austin. Benjamin Stain, M.D., ABFM., CAQSM. Primary Care and Sports Medicine [facility] [name]  Adjunct  Professor of Family Medicine  [employer]

## 2018-07-26 NOTE — Telephone Encounter (Signed)
Yeah, if no fever or respiratory symptoms have her come in this afternoon.  I probably need to confirm this is shingles and we are actually treating the right thing, especially considering its a rash, and 99% of the diagnosis comes from looking at it. :-P

## 2018-07-26 NOTE — Assessment & Plan Note (Signed)
I was able to see some of the rash but as expected on a camera it is really not clear. Continue Valtrex 3 times a day 1000 mg. Adding prednisone 50 mg daily. Increase Lyrica to 100 mg 3 times per day. She can return to work when the lesions have crusted over, if not feeling significantly better by the end of this coming week I need to see her in person.

## 2018-08-02 ENCOUNTER — Encounter: Payer: Self-pay | Admitting: Sports Medicine

## 2018-08-04 MED FILL — IRBESARTAN 150 MG TAB: 150 | 30 days supply | Qty: 30 | Fill #2

## 2018-08-16 ENCOUNTER — Encounter: Payer: Self-pay | Admitting: Sports Medicine

## 2018-10-01 ENCOUNTER — Other Ambulatory Visit: Payer: Self-pay | Admitting: Sports Medicine

## 2018-10-01 MED FILL — IRBESARTAN 150 MG TABLET: 150 | 30 days supply | Qty: 30 | Fill #0

## 2018-10-18 ENCOUNTER — Ambulatory Visit (INDEPENDENT_AMBULATORY_CARE_PROVIDER_SITE_OTHER): Payer: 59 | Admitting: Sports Medicine

## 2018-10-18 ENCOUNTER — Encounter: Payer: Self-pay | Admitting: Sports Medicine

## 2018-10-18 DIAGNOSIS — F4321 Adjustment disorder with depressed mood: Secondary | ICD-10-CM | POA: Insufficient documentation

## 2018-10-18 MED ORDER — QUETIAPINE FUMARATE 50 MG PO TABS: 50.0000 mg | ORAL_TABLET | Freq: Every day | ORAL | 3 refills | Status: DC

## 2018-10-18 MED FILL — QUETIAPINE FUMARATE 50 MG T: 50 | 30 days supply | Qty: 30 | Fill #0

## 2018-10-18 NOTE — Progress Notes (Signed)
Virtual Visit via WebEx/MyChart   I connected with  [name] [name]  on 10/18/18 via WebEx/MyChart/Doximity Video and verified that I am speaking with the correct person using two identifiers.   I discussed the limitations, risks, security and privacy concerns of performing an evaluation and management service by WebEx/MyChart/Doximity Video, including the higher likelihood of inaccurate diagnosis and treatment, and the availability of in person appointments.  We also discussed the likely need of an additional face to face encounter for complete and high quality delivery of care.  I also discussed with the patient that there may be a patient responsible charge related to this service. The patient expressed understanding and wishes to proceed.  Provider location is either at home or medical facility. Patient location is at their home, different from provider location. People involved in care of the patient during this telehealth encounter were myself, my nurse/medical assistant, and my front office/scheduling team member.  Subjective:    CC: Feeling anxious  HPI: This is a pleasant 53 year old female, she has had a couple of losses at work, and some struggles at home with her husband, she has significant anxiety, insomnia.  Her mind tends to race at night, no suicidal or homicidal ideation.  Her main goal is to get some sleep.  I reviewed the past medical history, family history, social history, surgical history, and allergies today and no changes were needed.  Please see the problem list section below in epic for further details.  Past Medical History: Past Medical History:  Diagnosis Date  . Asthma   . Essential hypertension 01/14/2015  . GERD (gastroesophageal reflux disease)   . Hydronephrosis, right    chronic  . IBS (irritable bowel syndrome)   . Incarcerated epigastric hernia 01/10/2017  . Murmur    Past Surgical History: Past Surgical History:  Procedure Laterality Date  .  HAND SURGERY     LT 2002  . INSERTION OF MESH N/A 01/10/2017   Procedure: INSERTION OF MESH;  Surgeon: Claud KelpIngram, Haywood, MD;  [facility];  Service: General;  Laterality: N/A;  . PYELOPLASTY     RT kidney 1993  . pyleoplasty     right kidney  . TONSILLECTOMY    . VENTRAL HERNIA REPAIR N/A 01/10/2017   Procedure: LAPAROSCOPIC REPAIR EPIGASTRIC HERNIA WITH MESH;  Surgeon: Claud KelpIngram, Haywood, MD;  [facility];  Service: General;  Laterality: N/A;   Social History: Social History   Socioeconomic History  . Marital status: Married    Spouse name: Not on file  . Number of children: Not on file  . Years of education: Not on file  . Highest education level: Not on file  Occupational History  . Occupation: Teacher, adult educationN    Employer: [employer]    Comment: Works at [employer]  Social Needs  . Financial resource strain: Not on file  . Food insecurity    Worry: Not on file    Inability: Not on file  . Transportation needs    Medical: Not on file    Non-medical: Not on file  Tobacco Use  . Smoking status: Never Smoker  . Smokeless tobacco: Never Used  Substance and Sexual Activity  . Alcohol use: Yes    Comment: occasionally  . Drug use: No  . Sexual activity: Yes  Lifestyle  . Physical activity    Days per week: Not on file    Minutes per session: Not on file  . Stress: Not on file  Relationships  .  Social Herbalist on phone: Not on file    Gets together: Not on file    Attends religious service: Not on file    Active member of club or organization: Not on file    Attends meetings of clubs or organizations: Not on file    Relationship status: Not on file  Other Topics Concern  . Not on file  Social History Narrative  . Not on file   Family History: Family History  Problem Relation Age of Onset  . Alcohol abuse Father   . Depression Father   . Stroke Father   . Hypertension Father   . Heart attack Father   . Esophageal cancer Father   . Breast cancer Mother         metastatic to lungs  . Heart disease Mother   . Hypertension Mother   . Cancer Mother        breast and lung  . Heart attack Mother   . Mental illness Paternal Aunt    Allergies: Allergies  Allergen Reactions  . Sulfonamide Derivatives Rash    Blisters in mucous membranes   Medications: See med rec.  Review of Systems: No fevers, chills, night sweats, weight loss, chest pain, or shortness of breath.   Objective:    General: Speaking full sentences, no audible heavy breathing.  Sounds alert and appropriately interactive.  Appears well.  Face symmetric.  Extraocular movements intact.  Pupils equal and round.  No nasal flaring or accessory muscle use visualized.  No other physical exam performed due to the non-physical nature of this visit.  Impression and Recommendations:    Grieving Shawgo has been through a lot, she has lost 1 of the doctors in her practice to a myocardial infarction, another colleague had a major stroke, her husband is sick, and not taking his medications. She is anxious, she cannot sleep, no suicidal or homicidal ideation. Adding Seroquel 50 mg at bedtime. I would like her to discuss her symptoms as well with Myra Gianotti for behavioral therapy. She and I will revisit this in 2 weeks to titrate dose if needed.  I discussed the above assessment and treatment plan with the patient. The patient was provided an opportunity to ask questions and all were answered. The patient agreed with the plan and demonstrated an understanding of the instructions.   The patient was advised to call back or seek an in-person evaluation if the symptoms worsen or if the condition fails to improve as anticipated.   I provided 25 minutes of non-face-to-face time during this encounter, 15 minutes of additional time was needed to gather information, review chart, records, communicate/coordinate with staff remotely, troubleshooting the multiple errors that we get every time when trying to  do video calls through the electronic medical record, WebEx, and Doximity, restart the encounter multiple times due to instability of the software, as well as complete documentation.   ___________________________________________ Gwen Her. Dianah Field, M.D., ABFM., CAQSM. Primary Care and Sports Medicine [facility]  Adjunct Professor of [employer] [facility] [employer]

## 2018-10-18 NOTE — Assessment & Plan Note (Signed)
[name] [name] has been through a lot, she has lost 1 of the doctors in her practice to a myocardial infarction, another colleague had a major stroke, her husband is sick, and not taking his medications. She is anxious, she cannot sleep, no suicidal or homicidal ideation. Adding Seroquel 50 mg at bedtime. I would like her to discuss her symptoms as well with Myra Gianotti for behavioral therapy. She and I will revisit this in 2 weeks to titrate dose if needed.

## 2018-10-19 ENCOUNTER — Ambulatory Visit: Payer: 59 | Admitting: Sports Medicine

## 2018-11-02 MED FILL — IRBESARTAN 150 MG TABLET: 150 | 30 days supply | Qty: 30 | Fill #1

## 2018-11-12 MED FILL — QUETIAPINE FUMARATE 50 MG T: 50 | 30 days supply | Qty: 30 | Fill #1

## 2018-11-12 MED FILL — IRBESARTAN 150 MG TABLET: 150 | 30 days supply | Qty: 30 | Fill #1

## 2018-12-03 ENCOUNTER — Ambulatory Visit (INDEPENDENT_AMBULATORY_CARE_PROVIDER_SITE_OTHER): Payer: 59

## 2018-12-03 ENCOUNTER — Other Ambulatory Visit: Payer: Self-pay

## 2018-12-03 ENCOUNTER — Ambulatory Visit (INDEPENDENT_AMBULATORY_CARE_PROVIDER_SITE_OTHER): Payer: 59 | Admitting: Family Medicine

## 2018-12-03 ENCOUNTER — Encounter: Payer: Self-pay | Admitting: Family Medicine

## 2018-12-03 VITALS — BP 135/89 | HR 83 | Temp 98.4°F | Wt 162.0 lb

## 2018-12-03 DIAGNOSIS — S9031XA Contusion of right foot, initial encounter: Secondary | ICD-10-CM | POA: Diagnosis not present

## 2018-12-03 DIAGNOSIS — M79671 Pain in right foot: Secondary | ICD-10-CM | POA: Diagnosis not present

## 2018-12-03 MED FILL — ALBUTEROL SULFATE HFA 108 (: 108 (90 BAS | 50 days supply | Qty: 36 | Fill #1

## 2018-12-03 NOTE — Progress Notes (Signed)
[name] [name] is a 53 y.o. female who presents to [facility] today for riight foot injury.  Starting Saturday had heavy pasta mixer attachment landed on dorsal aspect of her right foot at fifth MTP.  She was wearing flip-flops and notes pain and swelling.  She has pain sometimes with ambulation.  She works as a Marine scientist in Psychologist, counselling.  She is planning on leaving tomorrow to go to [facility] for vacation.  She plans on flying.  She is worried that there may be something more injured in her foot than she realizes.  She is tried some over-the-counter medications for pain which helped a little.     ROS:  As above  Exam:  BP 135/89   Pulse 83   Temp 98.4 F (36.9 C) (Oral)   Wt 162 lb (73.5 kg)   LMP 11/09/2018   BMI 30.61 kg/m  Wt Readings from Last 5 Encounters:  12/03/18 162 lb (73.5 kg)  07/23/18 160 lb (72.6 kg)  07/09/18 160 lb (72.6 kg)  01/12/18 157 lb (71.2 kg)  12/27/17 155 lb (70.3 kg)   General: Well Developed, well nourished, and in no acute distress.  Neuro/Psych: Alert and oriented x3, extra-ocular muscles intact, able to move all 4 extremities, sensation grossly intact. Skin: Warm and dry, no rashes noted.  Respiratory: Not using accessory muscles, speaking in full sentences, trachea midline.  Cardiovascular: Pulses palpable, no extremity edema. Abdomen: Does not appear distended. MSK: Right foot: Slightly swollen with slight bruising at the dorsal aspect of her foot at fifth MTP.  Tender palpation of this region with some pain with toe motion.  Pulses cap refill and sensation are intact.  Foot and ankle motion otherwise normal.    Lab and Radiology Results X-ray images right foot obtained today personally and independently reviewed. No acute fractures are visible.  Minimal degenerative changes.  Await formal radiology over read.    Assessment and Plan: 53 y.o. female with foot contusion.  No obvious fractures  on x-ray.  Discussed options.  Plan for postop shoe and over-the-counter NSAIDs or Tylenol.  Discussed icing as well.  Additionally discussed buddy tape.  Recheck if not improving in near future.  Also discussed COVID precautions for airplane travel.   PDMP not reviewed this encounter. Orders Placed This Encounter  Procedures  . DG Foot Complete Right    Standing Status:   Future    Number of Occurrences:   1    Standing Expiration Date:   02/02/2020    Order Specific Question:   Reason for Exam (SYMPTOM  OR DIAGNOSIS REQUIRED)    Answer:   eval foot injury after drop    Order Specific Question:   Is patient pregnant?    Answer:   No    Order Specific Question:   Preferred imaging location?    Answer:   [facility]    Order Specific Question:   Radiology Contrast Protocol - do NOT remove file path    Answer:   \\[email or web address]s.pdf   No orders of the defined types were placed in this encounter.   Historical information moved to improve visibility of documentation.  Past Medical History:  Diagnosis Date  . Asthma   . Essential hypertension 01/14/2015  . GERD (gastroesophageal reflux disease)   . Hydronephrosis, right    chronic  . IBS (irritable bowel syndrome)   . Incarcerated epigastric hernia 01/10/2017  . Murmur    Past Surgical History:  Procedure Laterality Date  . HAND SURGERY     LT 2002  . INSERTION OF MESH N/A 01/10/2017   Procedure: INSERTION OF MESH;  Surgeon: Claud KelpIngram, Haywood, MD;  [facility];  Service: General;  Laterality: N/A;  . PYELOPLASTY     RT kidney 1993  . pyleoplasty     right kidney  . TONSILLECTOMY    . VENTRAL HERNIA REPAIR N/A 01/10/2017   Procedure: LAPAROSCOPIC REPAIR EPIGASTRIC HERNIA WITH MESH;  Surgeon: Claud KelpIngram, Haywood, MD;  [facility];  Service: General;  Laterality: N/A;   Social History   Tobacco Use  . Smoking status: Never Smoker  . Smokeless tobacco: Never Used  Substance  Use Topics  . Alcohol use: Yes    Comment: occasionally   family history includes Alcohol abuse in her father; Breast cancer in her mother; Cancer in her mother; Depression in her father; Esophageal cancer in her father; Heart attack in her father and mother; Heart disease in her mother; Hypertension in her father and mother; Mental illness in her paternal aunt; Stroke in her father.  Medications: Current Outpatient Medications  Medication Sig Dispense Refill  . acetaminophen (TYLENOL) 325 MG tablet Take 650 mg by mouth every 6 (six) hours as needed for headache.    . albuterol (PROVENTIL HFA;VENTOLIN HFA) 108 (90 Base) MCG/ACT inhaler Inhale 2 puffs into the lungs every 6 (six) hours as needed for wheezing. 2 Inhaler 11  . cyclobenzaprine (FLEXERIL) 10 MG tablet One half tab PO qHS, then increase gradually to one tab TID. 30 tablet 0  . famotidine (PEPCID) 20 MG tablet One at bedtime 30 tablet 2  . fluticasone (FLONASE) 50 MCG/ACT nasal spray One spray in each nostril twice a day, use left hand for right nostril, and right hand for left nostril. (Patient taking differently: Place 1 spray into both nostrils 2 (two) times daily as needed (seasonal allergies). ) 48 g 3  . hyoscyamine (LEVSIN SL) 0.125 MG SL tablet Place 0.125 mg under the tongue 3 (three) times daily as needed (abdominal cramping).     . irbesartan (AVAPRO) 150 MG tablet TAKE 1 TABLET (150 MG TOTAL) BY MOUTH DAILY. 30 tablet 2  . levocetirizine (XYZAL) 5 MG tablet Take 1 tablet (5 mg total) by mouth every evening. 90 tablet 3  . pregabalin (LYRICA) 100 MG capsule Take 1 capsule (100 mg total) by mouth 3 (three) times daily. 90 capsule 0  . QUEtiapine (SEROQUEL) 50 MG tablet Take 1 tablet (50 mg total) by mouth at bedtime. 30 tablet 3   No current facility-administered medications for this visit.    Allergies  Allergen Reactions  . Sulfonamide Derivatives Rash    Blisters in mucous membranes      Discussed warning signs or  symptoms. Please see discharge instructions. Patient expresses understanding.

## 2018-12-03 NOTE — Patient Instructions (Addendum)
Thank you for coming in today. Buddy tape the toes.  Use post op shoe as needed.  Ibuprofen and tylenol are fine.  Recheck as needed if not improving.  I will get the xray report to you ASAP.  Mychart messages will work well.  If you want a medicine called in make sure in your message to state which pharmacy you want [place] to use.    How to Buddy Tape Buddy taping refers to taping an injured finger or toe to an uninjured finger or toe that is next to it. This protects the injured finger or toe and keeps it from moving while the injury heals. You may buddy tape a finger or toe if you have a minor sprain. Your health care provider may buddy tape your finger or toe if you have a sprain, dislocation, or fracture. You may be told to replace your buddy taping as needed. What are the risks? Generally, buddy taping is safe. However, problems may occur, such as:  Skin injury or infection.  Reduced blood flow to the finger or toe.  Skin reaction to the tape. Do not buddy tape your toe if you have diabetes. Do not buddy tape if you know that you have an allergy to adhesives or surgical tape. Supplies needed:  Gauze pad, cotton, or cloth.  Tape. This may be called first-aid tape, surgical tape, or medical tape. How to buddy tape Before buddy taping Lessen any pain and swelling with rest, icing, and elevation:  Avoid activities that cause pain.  If directed, put ice on the injured area: ? Put ice in a plastic bag. ? Place a towel between your skin and the bag. ? Leave the ice on for 20 minutes, 2-3 times a day.  Raise (elevate) your hand or foot above the level of your heart while you are sitting or lying down.  Buddy taping procedure   Clean and dry your finger or toe as told by your health care provider.  Place a gauze pad or a piece of cloth or cotton between your injured finger or toe and the uninjured finger or toe.  Use tape to wrap around both fingers or toes so your injured  finger or toe is secured to the uninjured finger or toe. ? The tape should be snug, but not tight. ? Make sure the ends of the piece of tape overlap. ? Avoid placing tape directly over the joint.  Change the tape and the padding as told by your health care provider. Remove and replace the tape or padding if it becomes loose, worn, dirty, or wet. After buddy taping  Watch the buddy-taped area and always remove buddy taping if your: ? Pain gets worse. ? Fingers or toes turn pale or blue. ? Skin becomes irritated. Follow these instructions at home:  Take over-the-counter and prescription medicines only as told by your health care provider.  Return to your normal activities as told by your health care provider. Ask your health care provider what activities are safe for you. Contact a health care provider if:  You have pain, swelling, or bruising that lasts longer than 3 days.  You have a fever.  Your skin is red, cracked, or irritated. Get help right away if:  The injured area becomes cold, numb, or pale.  You have severe pain, swelling, bruising, or loss of movement in your finger or toe.  Your finger or toe changes shape (deformity). Summary  Buddy taping refers to taping an injured finger  or toe to an uninjured finger or toe that is next to it.  You may buddy tape a finger or toe if you have a minor sprain.  Take over-the-counter and prescription medicines only as told by your health care provider. This information is not intended to replace advice given to you by your health care provider. Make sure you discuss any questions you have with your health care provider. Document Released: 05/26/2004 Document Revised: 08/09/2018 Document Reviewed: 04/30/2018 Elsevier Patient Education  2020 [employer]

## 2018-12-17 MED FILL — IRBESARTAN 150 MG TABLET: 150 | 30 days supply | Qty: 30 | Fill #2

## 2018-12-17 MED FILL — QUETIAPINE FUMARATE 50 MG T: 50 | 30 days supply | Qty: 30 | Fill #2

## 2019-01-15 ENCOUNTER — Other Ambulatory Visit: Payer: Self-pay | Admitting: Sports Medicine

## 2019-01-15 MED FILL — QUETIAPINE FUMARATE 50 MG T: 50 | 30 days supply | Qty: 30 | Fill #3

## 2019-01-15 MED FILL — IRBESARTAN 150 MG TABLET: 150 | 30 days supply | Qty: 30 | Fill #0

## 2019-02-12 ENCOUNTER — Other Ambulatory Visit: Payer: Self-pay | Admitting: Sports Medicine

## 2019-02-12 DIAGNOSIS — F4321 Adjustment disorder with depressed mood: Secondary | ICD-10-CM

## 2019-02-12 MED FILL — QUETIAPINE FUMARATE 50 MG T: 50 | 30 days supply | Qty: 30 | Fill #0

## 2019-03-01 MED FILL — IRBESARTAN 150 MG TABLET: 150 | 30 days supply | Qty: 30 | Fill #1

## 2019-03-20 MED FILL — LEVOCETIRIZINE 5 MG TABLET: 5 | 90 days supply | Qty: 90 | Fill #1

## 2019-03-20 MED FILL — QUETIAPINE FUMARATE 50 MG T: 50 | 30 days supply | Qty: 30 | Fill #1

## 2019-04-05 MED FILL — IRBESARTAN 150 MG TABLET: 150 | 30 days supply | Qty: 30 | Fill #2

## 2019-04-22 MED FILL — QUETIAPINE FUMARATE 50 MG T: 50 | 30 days supply | Qty: 30 | Fill #2

## 2019-04-22 MED FILL — ALBUTEROL SULFATE HFA 108 (: 108 (90 BAS | 50 days supply | Qty: 36 | Fill #2

## 2019-05-01 ENCOUNTER — Other Ambulatory Visit: Payer: Self-pay

## 2019-05-01 ENCOUNTER — Emergency Department (INDEPENDENT_AMBULATORY_CARE_PROVIDER_SITE_OTHER)
Admission: EM | Admit: 2019-05-01 | Discharge: 2019-05-01 | Disposition: A | Discharge status: Home / Self Care | Payer: 59 | Source: Home / Self Care

## 2019-05-01 ENCOUNTER — Encounter: Payer: Self-pay | Admitting: Family Medicine

## 2019-05-01 ENCOUNTER — Telehealth: Payer: 59 | Admitting: Family

## 2019-05-01 DIAGNOSIS — R21 Rash and other nonspecific skin eruption: Secondary | ICD-10-CM

## 2019-05-01 DIAGNOSIS — T7840XA Allergy, unspecified, initial encounter: Secondary | ICD-10-CM | POA: Diagnosis not present

## 2019-05-01 MED ORDER — TRIAMCINOLONE ACETONIDE 0.1 % EX CREA: 1.0000 "application " | TOPICAL_CREAM | Freq: Three times a day (TID) | CUTANEOUS | 0 refills | Status: DC

## 2019-05-01 NOTE — ED Provider Notes (Signed)
[facility]    CSN: [id number] Arrival date & time: 05/01/19  1742      History   Chief Complaint Chief Complaint  Patient presents with  . Rash    HPI [name] [name] is a 53 y.o. female.   Established [name] woman presents for evaluation of rash.  She is a Engineer, civil (consulting)nurse at [employer] and received your COVID-19 vaccine 11:00 on Saturday morning.  About 4 hours later she developed erythematous cheeks with rough skin and some induration.  No time did she have a sore throat or shortness of breath.  The redness, rough skin, and induration have continued with a peak occurring about 24 hours following the vaccine.     Past Medical History:  Diagnosis Date  . Asthma   . Essential hypertension 01/14/2015  . GERD (gastroesophageal reflux disease)   . Hydronephrosis, right    chronic  . IBS (irritable bowel syndrome)   . Incarcerated epigastric hernia 01/10/2017  . Murmur     Patient Active Problem List   Diagnosis Date Noted  . Grieving 10/18/2018  . Umbilical hernia 11/03/2016  . Annual physical exam 11/03/2016  . Upper airway cough syndrome 01/18/2015  . Essential hypertension 01/14/2015  . Dysfunctional uterine bleeding 05/27/2013  . Trochanteric bursitis of right hip 06/25/2012  . Right third Metacarpophalangeal joint pain 02/02/2012  . Asthma 06/23/2009  . GERD 06/23/2009  . IBS 06/23/2009    Past Surgical History:  Procedure Laterality Date  . HAND SURGERY     LT 2002  . INSERTION OF MESH N/A 01/10/2017   Procedure: INSERTION OF MESH;  Surgeon: Claud KelpIngram, Haywood, MD;  [facility];  Service: General;  Laterality: N/A;  . PYELOPLASTY     RT kidney 1993  . pyleoplasty     right kidney  . TONSILLECTOMY    . VENTRAL HERNIA REPAIR N/A 01/10/2017   Procedure: LAPAROSCOPIC REPAIR EPIGASTRIC HERNIA WITH MESH;  Surgeon: Claud KelpIngram, Haywood, MD;  [facility];  Service: General;  Laterality: N/A;    OB History    Gravida  2   Para  2   Term      Preterm      AB       Living        SAB      TAB      Ectopic      Multiple      Live Births               Home Medications    Prior to Admission medications   Medication Sig Start Date End Date Taking? Authorizing Provider  acetaminophen (TYLENOL) 325 MG tablet Take 650 mg by mouth every 6 (six) hours as needed for headache.    [provider]  albuterol (PROVENTIL HFA;VENTOLIN HFA) 108 (90 Base) MCG/ACT inhaler Inhale 2 puffs into the lungs every 6 (six) hours as needed for wheezing. 07/09/18   Monica Bectonhekkekandam, Thomas J, MD  cyclobenzaprine (FLEXERIL) 10 MG tablet One half tab PO qHS, then increase gradually to one tab TID. 03/22/18   Monica Bectonhekkekandam, Thomas J, MD  famotidine (PEPCID) 20 MG tablet One at bedtime 01/14/15   Nyoka CowdenWert, Michael B, MD  fluticasone [facility](FLONASE) 50 MCG/ACT nasal spray One spray in each nostril twice a day, use left hand for right nostril, and right hand for left nostril. Patient taking differently: Place 1 spray into both nostrils 2 (two) times daily as needed (seasonal allergies).  10/06/16   Monica Bectonhekkekandam, Thomas J, MD  hyoscyamine (LEVSIN SL) 0.125 MG SL tablet Place 0.125 mg under the tongue 3 (three) times daily as needed (abdominal cramping).  10/07/11   Parrett, Virgel Bouquet, NP  irbesartan (AVAPRO) 150 MG tablet TAKE 1 TABLET (150 MG TOTAL) BY MOUTH DAILY. 01/15/19   Monica Becton, MD  levocetirizine (XYZAL) 5 MG tablet Take 1 tablet (5 mg total) by mouth every evening. 07/09/18   Monica Becton, MD  pregabalin (LYRICA) 100 MG capsule Take 1 capsule (100 mg total) by mouth 3 (three) times daily. 07/26/18   Monica Becton, MD  QUEtiapine (SEROQUEL) 50 MG tablet TAKE 1 TABLET (50 MG TOTAL) BY MOUTH AT BEDTIME. 02/12/19   Monica Becton, MD  triamcinolone cream (KENALOG) 0.1 % Apply 1 application topically 3 (three) times daily. 05/01/19   Elvina Sidle, MD    Family History Family History  Problem Relation Age of Onset  . Alcohol abuse Father    . Depression Father   . Stroke Father   . Hypertension Father   . Heart attack Father   . Esophageal cancer Father   . Breast cancer Mother        metastatic to lungs  . Heart disease Mother   . Hypertension Mother   . Cancer Mother        breast and lung  . Heart attack Mother   . Mental illness Paternal Aunt     Social History Social History   Tobacco Use  . Smoking status: Never Smoker  . Smokeless tobacco: Never Used  Substance Use Topics  . Alcohol use: Yes    Comment: occasionally  . Drug use: No     Allergies   Sulfonamide derivatives   Review of Systems Review of Systems  Skin: Positive for rash.  All other systems reviewed and are negative.    Physical Exam Triage Vital Signs ED Triage Vitals  Enc Vitals Group     BP      Pulse      Resp      Temp      Temp src      SpO2      Weight      Height      Head Circumference      Peak Flow      Pain Score      Pain Loc      Pain Edu?      Excl. in GC?    No data found.  Updated Vital Signs BP (!) 147/93 (BP Location: Right Arm)   Pulse 79   Temp 97.6 F (36.4 C) (Oral)   Resp 18   SpO2 100%    Physical Exam Vitals and nursing note reviewed.  Constitutional:      Appearance: Normal appearance. She is obese.  HENT:     Head: Normocephalic.     Mouth/Throat:     Mouth: Mucous membranes are moist.     Pharynx: No posterior oropharyngeal erythema.  Eyes:     Conjunctiva/sclera: Conjunctivae normal.  Cardiovascular:     Rate and Rhythm: Normal rate.     Pulses: Normal pulses.  Pulmonary:     Effort: Pulmonary effort is normal.  Musculoskeletal:        General: Normal range of motion.     Cervical back: Normal range of motion and neck supple.  Skin:    General: Skin is warm and dry.     Findings: Erythema and rash present.  Neurological:  General: No focal deficit present.     Mental Status: She is alert and oriented to person, place, and time.  Psychiatric:        Mood  and Affect: Mood normal.        Behavior: Behavior normal.        Thought Content: Thought content normal.        Judgment: Judgment normal.        UC Treatments / Results  Labs (all labs ordered are listed, but only abnormal results are displayed) Labs Reviewed - No data to display  EKG   Radiology No results found.  Procedures Procedures (including critical care time)  Medications Ordered in UC Medications - No data to display  Initial Impression / Assessment and Plan / UC Course  I have reviewed the triage vital signs and the nursing notes.  Pertinent labs & imaging results that were available during my care of the patient were reviewed by me and considered in my medical decision making (see chart for details).    Final Clinical Impressions(s) / UC Diagnoses   Final diagnoses:  Allergic reaction, initial encounter     Discharge Instructions     I believe this is a allergic reaction to the Covid vaccine.  I will try to contact infectious disease at the hospital to find out what our next step should be and whether he should receive a second vaccine.    ED Prescriptions    Medication Sig Dispense Auth. Provider   triamcinolone cream (KENALOG) 0.1 % Apply 1 application topically 3 (three) times daily. 80 g Robyn Haber, MD     I have reviewed the PDMP during this encounter.   Robyn Haber, MD 05/01/19 [phone]

## 2019-05-01 NOTE — ED Triage Notes (Signed)
Pt c/o rash on face since Sat 6 hours after getting COVID vaccine. Red and raised. Taking benedryl nightly with little relief.

## 2019-05-01 NOTE — Discharge Instructions (Addendum)
I believe this is a allergic reaction to the Covid vaccine.  I will try to contact infectious disease at the hospital to find out what our next step should be and whether he should receive a second vaccine.  I am reluctant to use a systemic cortisone product because it might interfere with the effectiveness of this vaccine.  Instead will use cream and expect the rash to gradually resolve over the next 3 to 4 days.

## 2019-05-01 NOTE — Progress Notes (Signed)
E Visit for Rash  We are sorry that you are not feeling well. Here is how we plan to help!   Based on what you stated you are having a mild reaction. I recommend you take Benadryl 25 mg - 50 mg every 4 hours to control the symptoms but if they last over 24-48  hours it is best that you see an office based provider for follow up.   HOME CARE:   Take cool showers and avoid direct sunlight.  Apply cool compress or wet dressings.  Take a bath in an oatmeal bath.  Sprinkle content of one Aveeno packet under running faucet with comfortably warm water.  Bathe for 15-20 minutes, 1-2 times daily.  Pat dry with a towel. Do not rub the rash.  Use hydrocortisone cream.  Take an antihistamine like Benadryl for widespread rashes that itch.  The adult dose of Benadryl is 25-50 mg by mouth 4 times daily.  Caution:  This type of medication may cause sleepiness.  Do not drink alcohol, drive, or operate dangerous machinery while taking antihistamines.  Do not take these medications if you have prostate enlargement.  Read package instructions thoroughly on all medications that you take.  GET HELP RIGHT AWAY IF:   Symptoms don't go away after treatment.  Severe itching that persists.  If you rash spreads or swells.  If you rash begins to smell.  If it blisters and opens or develops a yellow-brown crust.  You develop a fever.  You have a sore throat.  You become short of breath.  MAKE SURE YOU:  Understand these instructions. Will watch your condition. Will get help right away if you are not doing well or get worse.  Thank you for choosing an e-visit. Your e-visit answers were reviewed by a board certified advanced clinical practitioner to complete your personal care plan. Depending upon the condition, your plan could have included both over the counter or prescription medications. Please review your pharmacy choice. Be sure that the pharmacy you have chosen is open so that you can pick  up your prescription now.  If there is a problem you may message your provider in [place] to have the prescription routed to another pharmacy. Your safety is important to [place]. If you have drug allergies check your prescription carefully.  For the next 24 hours, you can use MyChart to ask questions about today's visit, request a non-urgent call back, or ask for a work or school excuse from your e-visit provider. You will get an email in the next two days asking about your experience. I hope that your e-visit has been valuable and will speed your recovery.

## 2019-05-02 MED FILL — TRIAMCINOLONE ACETONIDE 0.1: 0.1 | 30 days supply | Qty: 80 | Fill #0

## 2019-05-13 NOTE — Telephone Encounter (Signed)
I would like a virtual visit to discuss this possible allergic reaction in more detail

## 2019-05-16 ENCOUNTER — Encounter: Payer: Self-pay | Admitting: Sports Medicine

## 2019-05-16 ENCOUNTER — Other Ambulatory Visit: Payer: Self-pay | Admitting: Sports Medicine

## 2019-05-16 ENCOUNTER — Ambulatory Visit (INDEPENDENT_AMBULATORY_CARE_PROVIDER_SITE_OTHER): Payer: 59 | Admitting: Sports Medicine

## 2019-05-16 DIAGNOSIS — T7840XA Allergy, unspecified, initial encounter: Secondary | ICD-10-CM

## 2019-05-16 DIAGNOSIS — R21 Rash and other nonspecific skin eruption: Secondary | ICD-10-CM | POA: Insufficient documentation

## 2019-05-16 DIAGNOSIS — R220 Localized swelling, mass and lump, head: Secondary | ICD-10-CM

## 2019-05-16 DIAGNOSIS — T8062XA Other serum reaction due to vaccination, initial encounter: Secondary | ICD-10-CM | POA: Diagnosis not present

## 2019-05-16 MED FILL — IRBESARTAN 150 MG TABLET: 150 | 30 days supply | Qty: 30 | Fill #0

## 2019-05-16 NOTE — Progress Notes (Addendum)
Virtual Visit via WebEx/MyChart   I connected with  [name] [name]  on 05/17/19 via WebEx/MyChart/Doximity Video and verified that I am speaking with the correct person using two identifiers.   I discussed the limitations, risks, security and privacy concerns of performing an evaluation and management service by WebEx/MyChart/Doximity Video, including the higher likelihood of inaccurate diagnosis and treatment, and the availability of in person appointments.  We also discussed the likely need of an additional face to face encounter for complete and high quality delivery of care.  I also discussed with the patient that there may be a patient responsible charge related to this service. The patient expressed understanding and wishes to proceed.  Provider location is either at home or medical facility. Patient location is at their home, different from provider location. People involved in care of the patient during this telehealth encounter were myself, my nurse/medical assistant, and my front office/scheduling team member.  Review of Systems: No fevers, chills, night sweats, weight loss, chest pain, or shortness of breath.   Objective Findings:    General: Speaking full sentences, no audible heavy breathing.  Sounds alert and appropriately interactive.  Appears well.  Face symmetric.  Extraocular movements intact.  Pupils equal and round.  No nasal flaring or accessory muscle use visualized.     Independent interpretation of tests performed by another provider:   None.  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  She had no rash anywhere else on her body.  She was seen in urgent care after a few doses of Benadryl, and a good picture was taken  of her rash.  Ultimately this looks a bit like rosacea to me. However because of the risk of and an amnestic response with the second vaccine I think she should hold off from getting her second vaccine, and we are going to check COVID-19 antibodies. If she has an antibody response we might just hold off on giving her the second vaccine, if she does not have an antibody response I would recommend that she get her second shot. I did explain to her the limitations in the science regarding our decision here.  Certainly in 1 month if her rash is still present we will consider switching to rosacea treatment such as topical metronidazole and azelaic acid.  No antibodies yet. I would recommend going forward with the vaccine, premedicate with Benadryl 50 mg maybe an hour or 2 prior to the injection. I would also recommend staying in the observation area for a solid hour.   I discussed the above assessment and treatment plan with the patient. The patient was provided an opportunity to ask questions and all were answered. The patient agreed with the plan and demonstrated an understanding of the instructions.   The patient was advised to call back or seek an in-person evaluation if the symptoms worsen or if the condition fails to improve as anticipated.   I provided 30 minutes of face to face and non-face-to-face time during this encounter date, time was needed to gather information, review chart, records, communicate/coordinate with staff remotely, as well as complete documentation.   ___________________________________________ Ihor Austin. Benjamin Stain, M.D., ABFM., CAQSM. Primary Care and Sports Medicine [facility]  Adjunct Instructor of [employer]  [employer]

## 2019-05-16 NOTE — Assessment & Plan Note (Addendum)
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  She had no rash anywhere else on her body.  She was seen in urgent care after a few doses of Benadryl, and a good picture was taken of her rash.  Ultimately this looks a bit like rosacea to me. However because of the risk of and an amnestic response with the second vaccine I think she should hold off from getting her second vaccine, and we are going to check COVID-19 antibodies. If she has an antibody response we might just hold off on giving her the second vaccine, if she does not have an antibody response I would recommend that she get her second shot. I did explain to her the limitations in the science regarding our decision here.  Certainly in 1 month if her rash is still present we will consider switching to rosacea treatment such as topical metronidazole and azelaic acid.  No antibodies yet. I would recommend going forward with the vaccine, premedicate with Benadryl 50 mg maybe an hour or 2 prior to the injection. I would also recommend staying in the observation area for a solid hour.

## 2019-05-17 LAB — SAR COV2 SEROLOGY (COVID19)AB(IGG),IA: SARS CoV2 AB IGG: NEGATIVE

## 2019-05-28 MED FILL — QUETIAPINE FUMARATE 50 MG T: 50 | 30 days supply | Qty: 30 | Fill #3

## 2019-06-26 ENCOUNTER — Other Ambulatory Visit: Payer: Self-pay | Admitting: Sports Medicine

## 2019-06-26 DIAGNOSIS — F4321 Adjustment disorder with depressed mood: Secondary | ICD-10-CM

## 2019-06-26 MED FILL — QUETIAPINE FUMARATE 50 MG T: 50 | 30 days supply | Qty: 30 | Fill #0

## 2019-06-26 MED FILL — IRBESARTAN 150 MG TABLET: 150 | 30 days supply | Qty: 30 | Fill #1

## 2019-07-04 ENCOUNTER — Encounter: Payer: Self-pay | Admitting: Sports Medicine

## 2019-07-04 ENCOUNTER — Other Ambulatory Visit: Payer: Self-pay

## 2019-07-04 ENCOUNTER — Ambulatory Visit (INDEPENDENT_AMBULATORY_CARE_PROVIDER_SITE_OTHER): Payer: 59 | Admitting: Sports Medicine

## 2019-07-04 DIAGNOSIS — R21 Rash and other nonspecific skin eruption: Secondary | ICD-10-CM | POA: Diagnosis not present

## 2019-07-04 DIAGNOSIS — M2550 Pain in unspecified joint: Secondary | ICD-10-CM | POA: Diagnosis not present

## 2019-07-04 MED ORDER — METRONIDAZOLE 0.75 % EX GEL: 1.0000 "application " | Freq: Two times a day (BID) | CUTANEOUS | 3 refills | Status: DC

## 2019-07-04 MED FILL — metroNIDAZOLE 0.75 % GEL: 0.75 | 30 days supply | Qty: 45 | Fill #0

## 2019-07-04 NOTE — Assessment & Plan Note (Signed)
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 It does come and go and flares. When she gets flares she does note malaise and polyarthralgias. Initially we had suspected this was an allergic reaction to the Covid vaccine but I am not so sure anymore. Starting with topical metronidazole. I am also going to add an autoimmune work-up. Return in 1 month, we will consider topical azelaic acid if no better and if this still does not work we will refer to dermatology for consideration of UV light therapy.

## 2019-07-04 NOTE — Progress Notes (Signed)
    Procedures performed today:    None.  Independent interpretation of notes and tests performed by another provider:   None.  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 It does come and go and flares. When she gets flares she does note malaise and polyarthralgias. Initially we had suspected this was an allergic reaction to the Covid vaccine but I am not so sure anymore. Starting with topical metronidazole. I am also going to add an autoimmune work-up. Return in 1 month, we will consider topical azelaic acid if no better and if this still does not work we will refer to dermatology for consideration of UV light therapy.  Polyarthralgia This pleasant 54 year old female does get an odd polyarthralgia when she gets her facial rash, I am going to pull the trigger for rheumatoid work-up as well.    ___________________________________________ Ihor Austin. Benjamin Stain, M.D., ABFM., CAQSM. Primary Care and Sports Medicine  [facility]  Adjunct Instructor of Family Medicine  [employer]

## 2019-07-04 NOTE — Assessment & Plan Note (Signed)
This pleasant 54 year old female does get an odd polyarthralgia when she gets her facial rash, I am going to pull the trigger for rheumatoid work-up as well.

## 2019-07-04 NOTE — Patient Instructions (Signed)
Rosacea Rosacea is a long-term (chronic) condition that affects the skin of the face, including the cheeks, nose, forehead, and chin. This condition can also affect the eyes. Rosacea causes blood vessels near the surface of the skin to enlarge, which results in redness. What are the causes? The cause of this condition is not known. Certain triggers can make rosacea worse, including:  Hot baths.  Exercise.  Sunlight.  Very hot or cold temperatures.  Hot or spicy foods and drinks.  Drinking alcohol.  Stress.  Taking blood pressure medicine.  Long-term use of topical steroids on the face. What increases the risk? You are more likely to develop this condition if you:  Are older than 53 years of age.  Are a woman.  Have light-colored skin (light complexion).  Have a family history of rosacea. What are the signs or symptoms? Symptoms of this condition include:  Redness of the face.  Red bumps or pimples on the face.  A red, enlarged nose.  Blushing easily.  Red lines on the skin.  Irritated, burning, or itchy feeling in the eyes.  Swollen eyelids.  Drainage from the eyes.  Feeling like there is something in your eye. How is this diagnosed? This condition is diagnosed with a medical history and physical exam. How is this treated? There is no cure for this condition, but treatment can help to control your symptoms. Your health care provider may recommend that you see a skin specialist (dermatologist). Treatment may include:  Medicines that are applied to the skin or taken by mouth (orally). This can include antibiotic medicines.  Laser treatment to improve the appearance of the skin.  Surgery. This is rare. Your health care provider will also recommend the best way to take care of your skin. Even after your skin improves, you will likely need to continue treatment to prevent your rosacea from coming back. Follow these instructions at home: Skin care Take care  of your skin as told by your health care provider. You may be told to do these things:  Wash your skin gently two or more times each day.  Use mild soap.  Use a sunscreen or sunblock with SPF 30 or greater.  Use gentle cosmetics that are meant for sensitive skin.  Shave with an electric shaver instead of a blade. Lifestyle  Try to keep track of what foods trigger this condition. Avoid any triggers. These may include: ? Spicy foods. ? Seafood. ? Cheese. ? Hot liquids. ? Nuts. ? Chocolate. ? Iodized salt.  Do not drink alcohol.  Avoid extremely cold or hot temperatures.  Try to reduce your stress. If you need help, talk with your health care provider.  When you exercise, do these things to stay cool: ? Limit sun exposure to your face. ? Use a fan. ? Do shorter and more frequent intervals of exercise. General instructions  Take and apply over-the-counter and prescription medicines only as told by your health care provider.  If you were prescribed an antibiotic medicine, apply it or take it as told by your health care provider. Do not stop using the antibiotic even if your condition improves.  If your eyelids are affected, apply warm compresses to them. Do this as told by your health care provider.  Keep all follow-up visits as told by your health care provider. This is important. Contact a health care provider if:  Your symptoms get worse.  Your symptoms do not improve after 2 months of treatment.  You have new   symptoms.  You have any changes in vision or you have problems with your eyes, such as redness or itching.  You feel depressed.  You lose your appetite.  You have trouble concentrating. Summary  Rosacea is a long-term (chronic) condition that affects the skin of the face, including the cheeks, nose, forehead, and chin.  Take care of your skin as told by your health care provider.  Take and apply over-the-counter and prescription medicines only as told  by your health care provider.  Contact a health care provider if your symptoms get worse or if you have any changes in vision or other problems with your eyes, such as redness or itching.  Keep all follow-up visits as told by your health care provider. This is important. This information is not intended to replace advice given to you by your health care provider. Make sure you discuss any questions you have with your health care provider. Document Revised: 09/20/2017 Document Reviewed: 09/20/2017 Elsevier Patient Education  2020 Elsevier Inc.  

## 2019-07-05 LAB — CBC WITH DIFFERENTIAL/PLATELET
Absolute Monocytes: 450 cells/uL (ref 200–950)
Basophils Absolute: 48 cells/uL (ref 0–200)
Basophils Relative: 0.8 %
Eosinophils Absolute: 192 cells/uL (ref 15–500)
Eosinophils Relative: 3.2 %
HCT: 40.5 % (ref 35.0–45.0)
Hemoglobin: 13.7 g/dL (ref 11.7–15.5)
Lymphs Abs: 2004 cells/uL (ref 850–3900)
MCH: 31.2 pg (ref 27.0–33.0)
MCHC: 33.8 g/dL (ref 32.0–36.0)
MCV: 92.3 fL (ref 80.0–100.0)
MPV: 11.6 fL (ref 7.5–12.5)
Monocytes Relative: 7.5 %
Neutro Abs: 3306 cells/uL (ref 1500–7800)
Neutrophils Relative %: 55.1 %
Platelets: 238 10*3/uL (ref 140–400)
RBC: 4.39 10*6/uL (ref 3.80–5.10)
RDW: 13 % (ref 11.0–15.0)
Total Lymphocyte: 33.4 %
WBC: 6 10*3/uL (ref 3.8–10.8)

## 2019-07-05 LAB — ANA, IFA COMPREHENSIVE PANEL
Anti Nuclear Antibody (ANA): NEGATIVE
ENA SM Ab Ser-aCnc: <1 AI
SM/RNP: <1 AI
SSA (Ro) (ENA) Antibody, IgG: <1 AI
SSB (La) (ENA) Antibody, IgG: <1 AI
Scleroderma (Scl-70) (ENA) Antibody, IgG: <1 AI
ds DNA Ab: <1 IU/mL

## 2019-07-05 LAB — COMPREHENSIVE METABOLIC PANEL
AG Ratio: 2.2 (calc) (ref 1.0–2.5)
ALT: 27 U/L (ref 6–29)
AST: 18 U/L (ref 10–35)
Albumin: 4.4 g/dL (ref 3.6–5.1)
Alkaline phosphatase (APISO): 87 U/L (ref 37–153)
BUN: 15 mg/dL (ref 7–25)
CO2: 27 mmol/L (ref 20–32)
Calcium: 9.4 mg/dL (ref 8.6–10.4)
Chloride: 105 mmol/L (ref 98–110)
Creat: 0.79 mg/dL (ref 0.50–1.05)
Globulin: 2 g/dL (calc) (ref 1.9–3.7)
Glucose, Bld: 76 mg/dL (ref 65–99)
Potassium: 4.4 mmol/L (ref 3.5–5.3)
Sodium: 140 mmol/L (ref 135–146)
Total Bilirubin: 0.4 mg/dL (ref 0.2–1.2)
Total Protein: 6.4 g/dL (ref 6.1–8.1)

## 2019-07-05 LAB — SEDIMENTATION RATE: Sed Rate: 6 mm/h (ref 0–30)

## 2019-07-26 MED FILL — IRBESARTAN 150 MG TAB: 150 | 30 days supply | Qty: 30 | Fill #2

## 2019-07-26 MED FILL — QUETIAPINE FUMARATE 50 MG T: 50 | 30 days supply | Qty: 30 | Fill #1

## 2019-08-05 ENCOUNTER — Telehealth (INDEPENDENT_AMBULATORY_CARE_PROVIDER_SITE_OTHER): Payer: 59 | Admitting: Sports Medicine

## 2019-08-05 ENCOUNTER — Other Ambulatory Visit: Payer: Self-pay | Admitting: Sports Medicine

## 2019-08-05 DIAGNOSIS — J4521 Mild intermittent asthma with (acute) exacerbation: Secondary | ICD-10-CM | POA: Diagnosis not present

## 2019-08-05 DIAGNOSIS — R21 Rash and other nonspecific skin eruption: Secondary | ICD-10-CM

## 2019-08-05 MED ORDER — LEVOCETIRIZINE DIHYDROCHLORIDE 5 MG PO TABS: 5.0000 mg | ORAL_TABLET | Freq: Every evening | ORAL | 3 refills | Status: DC

## 2019-08-05 MED ORDER — AZELAIC ACID 15 % EX GEL: 1.0000 "application " | Freq: Two times a day (BID) | CUTANEOUS | 11 refills | Status: DC

## 2019-08-05 MED FILL — LEVOCETIRIZINE 5 MG TABLET: 5 | 90 days supply | Qty: 90 | Fill #0

## 2019-08-05 MED FILL — AZELAIC ACID 15 % GEL: 15 | 30 days supply | Qty: 30 | Fill #0

## 2019-08-05 NOTE — Progress Notes (Signed)
   Virtual Visit via WebEx/MyChart   I connected with  [name] [name]  on 08/05/19 via WebEx/MyChart/Doximity Video and verified that I am speaking with the correct person using two identifiers.   I discussed the limitations, risks, security and privacy concerns of performing an evaluation and management service by WebEx/MyChart/Doximity Video, including the higher likelihood of inaccurate diagnosis and treatment, and the availability of in person appointments.  We also discussed the likely need of an additional face to face encounter for complete and high quality delivery of care.  I also discussed with the patient that there may be a patient responsible charge related to this service. The patient expressed understanding and wishes to proceed.  Provider location is either at home or medical facility. Patient location is at their home, different from provider location. People involved in care of the patient during this telehealth encounter were myself, my nurse/medical assistant, and my front office/scheduling team member.  Review of Systems: No fevers, chills, night sweats, weight loss, chest pain, or shortness of breath.   Objective Findings:    General: Speaking full sentences, no audible heavy breathing.  Sounds alert and appropriately interactive.  Appears well.  Face symmetric.  Extraocular movements intact.  Pupils equal and round.  No nasal flaring or accessory muscle use visualized.  Independent interpretation of tests performed by another provider:   None.  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 She had a slight improvement with topical metronidazole but symptoms are still fairly profound, switching to topical azelaic acid, if this fails after a month we will have dermatology weigh in. She sent in pictures through MyChart prior to her appointment as the  resolution on the campus for virtual visits are insufficient.  I have asked her to do this again for the next visit.   I discussed the above assessment and treatment plan with the patient. The patient was provided an opportunity to ask questions and all were answered. The patient agreed with the plan and demonstrated an understanding of the instructions.   The patient was advised to call back or seek an in-person evaluation if the symptoms worsen or if the condition fails to improve as anticipated.   I provided 30 minutes of face to face and non-face-to-face time during this encounter date, time was needed to gather information, review chart, records, communicate/coordinate with staff remotely, as well as complete documentation.   ___________________________________________ Ihor Austin. Benjamin Stain, M.D., ABFM., CAQSM. Primary Care and Sports Medicine [place] [facility]  Adjunct Instructor of Family Medicine  [employer]

## 2019-08-05 NOTE — Assessment & Plan Note (Signed)
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 She had a slight improvement with topical metronidazole but symptoms are still fairly profound, switching to topical azelaic acid, if this fails after a month we will have dermatology weigh in. She sent in pictures through MyChart prior to her appointment as the resolution on the campus for virtual visits are insufficient.  I have asked her to do this again for the next visit.

## 2019-08-28 ENCOUNTER — Other Ambulatory Visit: Payer: Self-pay | Admitting: Sports Medicine

## 2019-08-29 MED FILL — QUETIAPINE FUMARATE 50 MG T: 50 | 30 days supply | Qty: 30 | Fill #2

## 2019-08-29 MED FILL — IRBESARTAN 150 MG TAB: 150 | 30 days supply | Qty: 30 | Fill #0

## 2019-09-10 NOTE — Telephone Encounter (Signed)
If she can take a very clear and focused picture and attach it to a MyChart message that I think that would be okay.  Certainly a virtual visit with one of our NP's along with a very clear MyChart picture would be the best.

## 2019-09-11 ENCOUNTER — Telehealth (INDEPENDENT_AMBULATORY_CARE_PROVIDER_SITE_OTHER): Payer: 59 | Admitting: Family Medicine

## 2019-09-11 ENCOUNTER — Encounter: Payer: Self-pay | Admitting: Family Medicine

## 2019-09-11 DIAGNOSIS — W57XXXA Bitten or stung by nonvenomous insect and other nonvenomous arthropods, initial encounter: Secondary | ICD-10-CM | POA: Diagnosis not present

## 2019-09-11 DIAGNOSIS — S30861A Insect bite (nonvenomous) of abdominal wall, initial encounter: Secondary | ICD-10-CM

## 2019-09-11 MED ORDER — DOXYCYCLINE HYCLATE 100 MG PO TABS: 100.0000 mg | ORAL_TABLET | Freq: Two times a day (BID) | ORAL | 0 refills | Status: DC

## 2019-09-11 NOTE — Assessment & Plan Note (Addendum)
Increasing redness and warmth at site of tick bite.  Too early in course for serology to check for antibodies.  Will cover empirically for tick borne illness with 10 day course of doxycycline.  She will let me know if having any new or worsening symptoms.

## 2019-09-11 NOTE — Progress Notes (Signed)
[name] [name] - 54 y.o. female MRN [id number]  Date of birth: 1966/02/18   This visit type was conducted due to national recommendations for restrictions regarding the COVID-19 Pandemic (e.g. social distancing).  This format is felt to be most appropriate for this patient at this time.  All issues noted in this document were discussed and addressed.  No physical exam was performed (except for noted visual exam findings with Video Visits).  I discussed the limitations of evaluation and management by telemedicine and the availability of in person appointments. The patient expressed understanding and agreed to proceed.  I connected with@ on 09/11/19 at  1:40 PM EDT by a video enabled telemedicine application and verified that I am speaking with the correct person using two identifiers.  Present at visit: [name] Coombe, DO [name]   Patient Location: Home [address] [place] [address]   Provider location:   [facility]  Chief Complaint  Patient presents with  . Tick Removal    HPI  MATISHA TERMINE is a 54 y.o. female who presents via audio/video conferencing for a telehealth visit today.  She reports finding tick on R side just above hip 2 days ago. Only slightly engorged, fairly confident attached <24 hours.  She removed tick intact.  Started having redness and warmth around site with mild pain that radiates to groin area.  She has had mildly elevated temp in the low 99's.  She has also had headache.  She denies abdominal pain or nausea.  She has not noticed any other rashes.    ROS:  A comprehensive ROS was completed and negative except as noted per HPI  I  Past Medical History:  Diagnosis Date  . Asthma   . Essential hypertension 01/14/2015  . GERD (gastroesophageal reflux disease)   . Hydronephrosis, right    chronic  . IBS (irritable bowel syndrome)   . Incarcerated epigastric hernia 01/10/2017  . Murmur     Past Surgical History:  Procedure Laterality Date  . HAND  SURGERY     LT 2002  . INSERTION OF MESH N/A 01/10/2017   Procedure: INSERTION OF MESH;  Surgeon: Claud Kelp, MD;  Location: [facility] OR;  Service: General;  Laterality: N/A;  . PYELOPLASTY     RT kidney 1993  . pyleoplasty     right kidney  . TONSILLECTOMY    . VENTRAL HERNIA REPAIR N/A 01/10/2017   Procedure: LAPAROSCOPIC REPAIR EPIGASTRIC HERNIA WITH MESH;  Surgeon: Claud Kelp, MD;  Location: [facility] OR;  Service: General;  Laterality: N/A;    Family History  Problem Relation Age of Onset  . Alcohol abuse Father   . Depression Father   . Stroke Father   . Hypertension Father   . Heart attack Father   . Esophageal cancer Father   . Breast cancer Mother        metastatic to lungs  . Heart disease Mother   . Hypertension Mother   . Cancer Mother        breast and lung  . Heart attack Mother   . Mental illness Paternal Aunt     Social History   Socioeconomic History  . Marital status: Married    Spouse name: Not on file  . Number of children: Not on file  . Years of education: Not on file  . Highest education level: Not on file  Occupational History  . Occupation: Teacher, adult education: [employer]    Comment:  Works at [employer]  Tobacco Use  . Smoking status: Never Smoker  . Smokeless tobacco: Never Used  Substance and Sexual Activity  . Alcohol use: Yes    Comment: occasionally  . Drug use: No  . Sexual activity: Yes  Other Topics Concern  . Not on file  Social History Narrative  . Not on file   Social Determinants of Health   Financial Resource Strain:   . Difficulty of Paying Living Expenses:   Food Insecurity:   . Worried About Programme researcher, broadcasting/film/video in the Last Year:   . Barista in the Last Year:   Transportation Needs:   . Freight forwarder (Medical):   Marland Kitchen Lack of Transportation (Non-Medical):   Physical Activity:   . Days of Exercise per Week:   . Minutes of Exercise per Session:   Stress:   . Feeling of Stress :   Social Connections:   .  Frequency of Communication with Friends and Family:   . Frequency of Social Gatherings with Friends and Family:   . Attends Religious Services:   . Active Member of Clubs or Organizations:   . Attends Banker Meetings:   Marland Kitchen Marital Status:   Intimate Partner Violence:   . Fear of Current or Ex-Partner:   . Emotionally Abused:   Marland Kitchen Physically Abused:   . Sexually Abused:      Current Outpatient Medications:  .  acetaminophen (TYLENOL) 325 MG tablet, Take 650 mg by mouth every 6 (six) hours as needed for headache., Disp: , Rfl:  .  albuterol (PROVENTIL HFA;VENTOLIN HFA) 108 (90 Base) MCG/ACT inhaler, Inhale 2 puffs into the lungs every 6 (six) hours as needed for wheezing., Disp: 2 Inhaler, Rfl: 11 .  Azelaic Acid 15 % cream, Apply 1 application topically 2 (two) times daily., Disp: 30 g, Rfl: 11 .  fluticasone (FLONASE) 50 MCG/ACT nasal spray, One spray in each nostril twice a day, use left hand for right nostril, and right hand for left nostril. (Patient taking differently: Place 1 spray into both nostrils 2 (two) times daily as needed (seasonal allergies). ), Disp: 48 g, Rfl: 3 .  irbesartan (AVAPRO) 150 MG tablet, TAKE 1 TABLET BY MOUTH ONCE DAILY, Disp: 30 tablet, Rfl: 2 .  levocetirizine (XYZAL) 5 MG tablet, Take 1 tablet (5 mg total) by mouth every evening., Disp: 90 tablet, Rfl: 3 .  QUEtiapine (SEROQUEL) 50 MG tablet, TAKE 1 TABLET (50 MG TOTAL) BY MOUTH AT BEDTIME., Disp: 30 tablet, Rfl: 3 .  triamcinolone cream (KENALOG) 0.1 %, Apply 1 application topically 3 (three) times daily., Disp: 80 g, Rfl: 0 .  doxycycline (VIBRA-TABS) 100 MG tablet, Take 1 tablet (100 mg total) by mouth 2 (two) times daily., Disp: 20 tablet, Rfl: 0  EXAM:  VITALS per patient if applicable: Temp 99.5 F (37.5 C) (Oral)   Wt 147 lb (66.7 kg)   BMI 27.78 kg/m   GENERAL: alert, oriented, appears well and in no acute distress  HEENT: atraumatic, conjunttiva clear, no obvious abnormalities on  inspection of external nose and ears  NECK: normal movements of the head and neck  LUNGS: on inspection no signs of respiratory distress, breathing rate appears normal, no obvious gross SOB, gasping or wheezing  CV: no obvious cyanosis  MS: moves all visible extremities without noticeable abnormality  Media Information     ASSESSMENT AND PLAN:  Discussed the following assessment and plan:  Tick bite Increasing redness and warmth  at site of tick bite.  Too early in course for serology to check for antibodies.  Will cover empirically for tick borne illness with 10 day course of doxycycline.  She will let me know if having any new or worsening symptoms.   30 minutes spent including pre visit preparation, review of prior notes and labs, encounter with patient via video visit and same day documentation.  I discussed the assessment and treatment plan with the patient. The patient was provided an opportunity to ask questions and all were answered. The patient agreed with the plan and demonstrated an understanding of the instructions.   The patient was advised to call back or seek an in-person evaluation if the symptoms worsen or if the condition fails to improve as anticipated.    Luetta Nutting, DO

## 2019-09-11 NOTE — Progress Notes (Signed)
Pulled a tick off her side on Monday morning. She got the head out.  Has a red area that is getting larger.  Draining clear fluid Warm to touch Itching Tenderness going into her groin area Headache  Taking Tylenol and Advil.

## 2019-09-26 MED FILL — QUETIAPINE FUMARATE 50 MG T: 50 | 30 days supply | Qty: 30 | Fill #3

## 2019-09-26 MED FILL — IRBESARTAN 150 MG TAB: 150 | 30 days supply | Qty: 30 | Fill #1

## 2019-10-28 ENCOUNTER — Other Ambulatory Visit: Payer: Self-pay | Admitting: Sports Medicine

## 2019-10-28 DIAGNOSIS — F4321 Adjustment disorder with depressed mood: Secondary | ICD-10-CM

## 2019-10-28 MED FILL — IRBESARTAN 150 MG TAB: 150 | 30 days supply | Qty: 30 | Fill #2

## 2019-10-28 MED FILL — QUETIAPINE FUMARATE 50 MG T: 50 | 30 days supply | Qty: 30 | Fill #0

## 2019-11-28 ENCOUNTER — Other Ambulatory Visit: Payer: Self-pay | Admitting: Sports Medicine

## 2019-11-28 MED FILL — IRBESARTAN 150 MG TABS: 150 | 30 days supply | Qty: 30 | Fill #0

## 2019-11-28 MED FILL — QUETIAPINE FUMARATE 50 MG T: 50 | 30 days supply | Qty: 30 | Fill #1

## 2020-01-02 ENCOUNTER — Other Ambulatory Visit: Payer: Self-pay | Admitting: Sports Medicine

## 2020-01-02 DIAGNOSIS — J4521 Mild intermittent asthma with (acute) exacerbation: Secondary | ICD-10-CM

## 2020-01-02 MED FILL — ALBUTEROL SULFATE HFA 108 (: 108 (90 BAS | 50 days supply | Qty: 36 | Fill #0

## 2020-01-02 MED FILL — QUETIAPINE FUMARATE 50 MG T: 50 | 30 days supply | Qty: 30 | Fill #2

## 2020-01-02 MED FILL — IRBESARTAN 150 MG TABS: 150 | 30 days supply | Qty: 30 | Fill #1

## 2020-01-08 ENCOUNTER — Ambulatory Visit (INDEPENDENT_AMBULATORY_CARE_PROVIDER_SITE_OTHER): Payer: 59 | Admitting: Medical-Surgical

## 2020-01-08 ENCOUNTER — Other Ambulatory Visit: Payer: Self-pay

## 2020-01-08 ENCOUNTER — Encounter: Payer: Self-pay | Admitting: Medical-Surgical

## 2020-01-08 VITALS — BP 106/68 | HR 84 | Temp 98.2°F | Ht 61.0 in | Wt 160.0 lb

## 2020-01-08 DIAGNOSIS — R21 Rash and other nonspecific skin eruption: Secondary | ICD-10-CM | POA: Diagnosis not present

## 2020-01-08 DIAGNOSIS — R29898 Other symptoms and signs involving the musculoskeletal system: Secondary | ICD-10-CM

## 2020-01-08 MED ORDER — CYCLOBENZAPRINE HCL 10 MG PO TABS: 10.0000 mg | ORAL_TABLET | Freq: Two times a day (BID) | ORAL | 0 refills | Status: DC | PRN

## 2020-01-08 MED FILL — AZELAIC ACID 15 % GEL: 15 | 30 days supply | Qty: 50 | Fill #1

## 2020-01-08 MED FILL — CYCLOBENZAPRINE HCL 10 MG T: 10 | 10 days supply | Qty: 20 | Fill #0

## 2020-01-08 NOTE — Progress Notes (Signed)
Subjective:    CC: Facial rash  HPI: Pleasant 54 year old female presenting today with reports of a facial rash affecting her bilateral cheeks, bridge of nose, and forehead.  Notes that she had this rash after getting her first Covid vaccine in December 2020.  After the development of the rash, she was seen by her PCP who tried multiple different treatment approaches without resolution.  After several months of trying different medications, she was prescribed azelaic acid 15% cream to use topically twice daily.  She reports this was the only thing that helped with her rash and she noticed full resolution near the end of May.  Unfortunately, due to the requirement of having completed the Covid vaccine for employees, she had to go get her second Covid shot last week.  Since then she has noticed that the facial rash has returned.  The rash does not itch but presents as increased erythema of the skin similar to rosacea.  She does have some papules across the bridge of her nose and her forehead but no pustules or vesicles.  Rashes not painful but she notes her skin feels very sensitive.  She has been using the azelaic acid 15% cream that she has left at home but presents today requesting a referral to a dermatologist for further evaluation.  Denies fever, chills, shortness of breath, and chest pain.  Also reports an increase in neck tightness lately.  Neck tightness resulted after an accident several years ago.  She has been treated for this in the past using muscle relaxers, massage, and heat.  Unable to seek regular massages due to her work schedule. She has been using heat since the tightness increased lately.  Wonders if this may be related to the increase stress with her job.  When she turns her head, she reports her neck pops but there is no release of the tension.  Denies numbness/tingling and weakness in bilateral upper extremities.  I reviewed the past medical history, family history, social history,  surgical history, and allergies today and no changes were needed.  Please see the problem list section below in epic for further details.  Past Medical History: Past Medical History:  Diagnosis Date  . Asthma   . Essential hypertension 01/14/2015  . GERD (gastroesophageal reflux disease)   . Hydronephrosis, right    chronic  . IBS (irritable bowel syndrome)   . Incarcerated epigastric hernia 01/10/2017  . Murmur    Past Surgical History: Past Surgical History:  Procedure Laterality Date  . HAND SURGERY     LT 2002  . INSERTION OF MESH N/A 01/10/2017   Procedure: INSERTION OF MESH;  Surgeon: Claud Kelp, MD;  Location: [facility] OR;  Service: General;  Laterality: N/A;  . PYELOPLASTY     RT kidney 1993  . pyleoplasty     right kidney  . TONSILLECTOMY    . VENTRAL HERNIA REPAIR N/A 01/10/2017   Procedure: LAPAROSCOPIC REPAIR EPIGASTRIC HERNIA WITH MESH;  Surgeon: Claud Kelp, MD;  Location: [facility] OR;  Service: General;  Laterality: N/A;   Social History: Social History   Socioeconomic History  . Marital status: Married    Spouse name: Not on file  . Number of children: Not on file  . Years of education: Not on file  . Highest education level: Not on file  Occupational History  . Occupation: Teacher, adult education: [employer]    Comment: Works at [employer]  Tobacco Use  . Smoking status: Never Smoker  .  Smokeless tobacco: Never Used  Vaping Use  . Vaping Use: Never used  Substance and Sexual Activity  . Alcohol use: Yes    Comment: occasionally  . Drug use: No  . Sexual activity: Yes  Other Topics Concern  . Not on file  Social History Narrative  . Not on file   Social Determinants of Health   Financial Resource Strain:   . Difficulty of Paying Living Expenses: Not on file  Food Insecurity:   . Worried About Programme researcher, broadcasting/film/video in the Last Year: Not on file  . Ran Out of Food in the Last Year: Not on file  Transportation Needs:   . Lack of Transportation (Medical): Not on  file  . Lack of Transportation (Non-Medical): Not on file  Physical Activity:   . Days of Exercise per Week: Not on file  . Minutes of Exercise per Session: Not on file  Stress:   . Feeling of Stress : Not on file  Social Connections:   . Frequency of Communication with Friends and Family: Not on file  . Frequency of Social Gatherings with Friends and Family: Not on file  . Attends Religious Services: Not on file  . Active Member of Clubs or Organizations: Not on file  . Attends Banker Meetings: Not on file  . Marital Status: Not on file   Family History: Family History  Problem Relation Age of Onset  . Alcohol abuse Father   . Depression Father   . Stroke Father   . Hypertension Father   . Heart attack Father   . Esophageal cancer Father   . Breast cancer Mother        metastatic to lungs  . Heart disease Mother   . Hypertension Mother   . Cancer Mother        breast and lung  . Heart attack Mother   . Mental illness Paternal Aunt    Allergies: Allergies  Allergen Reactions  . Sulfonamide Derivatives Rash    Blisters in mucous membranes   Medications: See med rec.  Review of Systems: See HPI for pertinent positives and negatives.   Objective:    General: Well Developed, well nourished, and in no acute distress.  Neuro: Alert and oriented x3.  HEENT: Normocephalic, atraumatic.  Skin: Warm and dry.  Erythematous rash to face covering bilateral cheeks, bridge of nose, and forehead.  Small scattered papules noted across forehead and nose. Cardiac: Regular rate and rhythm, no murmurs rubs or gallops, no lower extremity edema.  Respiratory: Clear to auscultation bilaterally. Not using accessory muscles, speaking in full sentences.  Impression and Recommendations:    1. Skin rash Continue using azelaic acid 15% cream twice daily to the affected area.  Referring to dermatology per patient request for further work-up and evaluation. - Ambulatory referral  to Dermatology  2. Neck tightness Handout regarding stretches and neck exercises provided.  Flexeril 10 mg twice daily as needed.  Consider using Tylenol/ibuprofen for discomfort.  Also continue using heat/ice as tolerated.  Return if symptoms worsen or fail to improve. ___________________________________________ Thayer Ohm, DNP, APRN, FNP-BC Primary Care and Sports Medicine [facility] [place]

## 2020-01-29 MED FILL — QUETIAPINE FUMARATE 50 MG T: 50 | 30 days supply | Qty: 30 | Fill #3

## 2020-01-29 MED FILL — IRBESARTAN 150 MG TAB: 150 | 30 days supply | Qty: 30 | Fill #2

## 2020-02-17 ENCOUNTER — Other Ambulatory Visit (HOSPITAL_BASED_OUTPATIENT_CLINIC_OR_DEPARTMENT_OTHER): Payer: Self-pay | Admitting: Optometry

## 2020-02-17 DIAGNOSIS — H01114 Allergic dermatitis of left upper eyelid: Secondary | ICD-10-CM | POA: Diagnosis not present

## 2020-02-17 MED FILL — AMOX-CLAV 500-125 MG TABLET: 500-125 | 7 days supply | Qty: 14 | Fill #0

## 2020-02-17 MED FILL — NEO/POLY/DEXAMET EYE OINT: 3.5-10000-0 | 25 days supply | Qty: 4 | Fill #0

## 2020-02-17 MED FILL — NEO/POLYMYXIN/DEXAMETH DROP: 3.5-10000-0 | 25 days supply | Qty: 5 | Fill #0

## 2020-02-18 ENCOUNTER — Other Ambulatory Visit: Payer: Self-pay | Admitting: Sports Medicine

## 2020-02-18 DIAGNOSIS — T63441A Toxic effect of venom of bees, accidental (unintentional), initial encounter: Secondary | ICD-10-CM | POA: Insufficient documentation

## 2020-02-18 MED ORDER — PREDNISONE 50 MG PO TABS: 50.0000 mg | ORAL_TABLET | Freq: Every day | ORAL | 0 refills | Status: DC

## 2020-02-18 MED FILL — predniSONE 50 MG TABS: 50 | 5 days supply | Qty: 5 | Fill #0

## 2020-02-18 NOTE — Assessment & Plan Note (Signed)
This pleasant 54 year old female was stung on her left eyelid by a bee, she had some visual blurriness so she was seen by ophthalmology, Maxitrol drops and Augmentin were prescribed, she sent me some pictures, I do think she needs some prednisone. Cool compresses will be effective as well.

## 2020-02-20 ENCOUNTER — Other Ambulatory Visit (HOSPITAL_BASED_OUTPATIENT_CLINIC_OR_DEPARTMENT_OTHER): Payer: Self-pay

## 2020-02-20 DIAGNOSIS — L719 Rosacea, unspecified: Secondary | ICD-10-CM | POA: Diagnosis not present

## 2020-02-20 MED FILL — SOOLANTRA 1% CREAM: 1 | 30 days supply | Qty: 45 | Fill #0

## 2020-02-24 ENCOUNTER — Other Ambulatory Visit: Payer: Self-pay

## 2020-02-24 ENCOUNTER — Ambulatory Visit (INDEPENDENT_AMBULATORY_CARE_PROVIDER_SITE_OTHER): Payer: 59 | Admitting: Nurse Practitioner

## 2020-02-24 ENCOUNTER — Other Ambulatory Visit: Payer: Self-pay | Admitting: Nurse Practitioner

## 2020-02-24 ENCOUNTER — Encounter: Payer: Self-pay | Admitting: Nurse Practitioner

## 2020-02-24 VITALS — BP 106/73 | HR 84 | Temp 98.3°F | Ht 61.0 in | Wt 158.3 lb

## 2020-02-24 DIAGNOSIS — T63441D Toxic effect of venom of bees, accidental (unintentional), subsequent encounter: Secondary | ICD-10-CM

## 2020-02-24 MED ORDER — PREDNISONE 20 MG PO TABS: ORAL_TABLET | ORAL | 0 refills | Status: DC

## 2020-02-24 MED ORDER — EPINEPHRINE 0.3 MG/0.3ML IJ SOAJ: 0.3000 mg | INTRAMUSCULAR | 0 refills | Status: DC | PRN

## 2020-02-24 MED ORDER — DOXYCYCLINE HYCLATE 100 MG PO TABS: 100.0000 mg | ORAL_TABLET | Freq: Two times a day (BID) | ORAL | 0 refills | Status: DC

## 2020-02-24 MED ORDER — MOMETASONE FUROATE 0.1 % EX CREA: 1.0000 "application " | TOPICAL_CREAM | Freq: Every day | CUTANEOUS | 0 refills | Status: DC

## 2020-02-24 MED ORDER — FLUCONAZOLE 150 MG PO TABS: 150.0000 mg | ORAL_TABLET | Freq: Once | ORAL | 1 refills | Status: DC

## 2020-02-24 MED ORDER — HYDROXYZINE HCL 50 MG PO TABS: 50.0000 mg | ORAL_TABLET | Freq: Three times a day (TID) | ORAL | 3 refills | Status: DC | PRN

## 2020-02-24 MED FILL — DOXYCYCLINE HYCLATE 100 MG: 100 | 5 days supply | Qty: 10 | Fill #0

## 2020-02-24 MED FILL — EPINEPHRINE 0.3 MG AUTO-INJ: 0.3 | 2 days supply | Qty: 2 | Fill #0

## 2020-02-24 MED FILL — predniSONE 20 MG TABS: 20 | 7 days supply | Qty: 13 | Fill #0

## 2020-02-24 MED FILL — FLUCONAZOLE 150 MG TABS: 150 | 2 days supply | Qty: 2 | Fill #0

## 2020-02-24 MED FILL — MOMETASONE FUROATE 0.1% CRE: 0.1 | 30 days supply | Qty: 45 | Fill #0

## 2020-02-24 MED FILL — hydrOXYzine HCL 50 MG TABS: 50 | 10 days supply | Qty: 30 | Fill #0

## 2020-02-24 NOTE — Patient Instructions (Signed)
Bee, Wasp, or Hornet Sting, Adult Bees, wasps, and hornets are part of a family of insects that can sting people. These stings can cause pain and inflammation, but they are usually not serious. However, some people may have an allergic reaction to a sting. This can cause the symptoms to be more severe. What increases the risk? You may be at a greater risk of getting stung if you:  Provoke a stinging insect by swatting or disturbing it.  Wear strong-smelling soaps, deodorants, or body sprays.  Spend time outdoors near gardens with flowers or fruit trees or in clothes that expose skin.  Eat or drink outside. What are the signs or symptoms? Common symptoms of this condition include:  A red lump in the skin that sometimes has a tiny hole in the center. In some cases, a stinger may be in the center of the wound.  Pain and itching at the sting site.  Redness and swelling around the sting site. If you have an allergic reaction (localized allergic reaction), the swelling and redness may spread out from the sting site. In some cases, this reaction can continue to develop over the next 24-48 hours. In rare cases, a person may have a severe allergic reaction (anaphylactic reaction) to a sting. Symptoms of an anaphylactic reaction may include:  Wheezing or difficulty breathing.  Raised, itchy, red patches on the skin (hives).  Nausea or vomiting.  Abdominal cramping.  Diarrhea.  Tightness in the chest or chest pain.  Dizziness or fainting.  Redness of the face (flushing).  Hoarse voice.  Swollen tongue, lips, or face. How is this diagnosed? This condition is usually diagnosed based on your symptoms and medical history as well as a physical exam. You may have an allergy test to determine if you are allergic to the substance that the insect injected during the sting (venom). How is this treated? If you were stung by a bee, the stinger and a small sac of venom may be in the wound. It is  important to remove the stinger as soon as possible. You can do this by brushing across the wound with gauze, a fingernail, or a flat card such as a credit card. Removing the stinger can help reduce the severity of your body's reaction to the sting. Most stings can be treated with:  Icing to reduce swelling in the area.  Medicines (antihistamines) to treat itching or an allergic reaction.  Medicines to help reduce pain. These may be medicines that you take by mouth, or medicated creams or lotions that you apply to your skin. Pay close attention to your symptoms after you have been stung. If possible, have someone stay with you to make sure you do not have an allergic reaction. If you have any signs of an allergic reaction, call your health care provider. If you have ever had a severe allergic reaction, your health care provider may give you an inhaler or injectable medicine (epinephrine auto-injector) to use if necessary. Follow these instructions at home:   Wash the sting site 2-3 times each day with soap and water as told by your health care provider.  Apply or take over-the-counter and prescription medicines only as told by your health care provider.  If directed, apply ice to the sting area. ? Put ice in a plastic bag. ? Place a towel between your skin and the bag. ? Leave the ice on for 20 minutes, 2-3 times a day.  Do not scratch the sting area.  If   you had a severe allergic reaction to a sting, you may need: ? To wear a medical bracelet or necklace that lists the allergy. ? To learn when and how to use an anaphylaxis kit or epinephrine injection. Your family members and coworkers may also need to learn this. ? To carry an anaphylaxis kit or epinephrine injection with you at all times. How is this prevented?  Avoid swatting at stinging insects and disturbing insect nests.  Do not use fragrant soaps or lotions.  Wear shoes, pants, and long sleeves when spending time outdoors,  especially in grassy areas where stinging insects are common.  Keep outdoor areas free from nests or hives.  Keep food and drink containers covered when eating outdoors.  Avoid working or sitting near flowering plants, if possible.  Wear gloves if you are gardening or working outdoors.  If an attack by a stinging insect or a swarm seems likely in the moment, move away from the area or find a barrier between you and the insect(s), such as a door. Contact a health care provider if:  Your symptoms do not get better in 2-3 days.  You have redness, swelling, or pain that spreads beyond the area of the sting.  You have a fever. Get help right away if: You have symptoms of a severe allergic reaction. These include:  Wheezing or difficulty breathing.  Tightness in the chest or chest pain.  Light-headedness or fainting.  Itchy, raised, red patches on the skin.  Nausea or vomiting.  Abdominal cramping.  Diarrhea.  A swollen tongue or lips, or trouble swallowing.  Dizziness or fainting. Summary  Stings from bees, wasps, and hornets can cause pain and inflammation, but they are usually not serious. However, some people may have an allergic reaction to a sting. This can cause the symptoms to be more severe.  Pay close attention to your symptoms after you have been stung. If possible, have someone stay with you to make sure you do not have an allergic reaction.  Call your health care provider if you have any signs of an allergic reaction. This information is not intended to replace advice given to you by your health care provider. Make sure you discuss any questions you have with your health care provider. Document Revised: 04/13/2017 Document Reviewed: 06/23/2016 Elsevier Patient Education  2020 [employer]  

## 2020-02-24 NOTE — Progress Notes (Signed)
Acute Office Visit  Subjective:    Patient ID: [name] [name], female    DOB: 1966-03-27, 54 y.o.   MRN: [id number]  Chief Complaint  Patient presents with  . Facial Swelling    HPI [name] [name] is a 54 year old female presenting today for follow-up for bee sting to her left eye lid 10/16. She reports that she was stung on the eyelid and the stinger remained attached. She tells me she felt she was able to remove the stinger. She went to the eye doctor for evaluation due to blurred vision and was found to have drainage from the eye. An eye exam was performed and she was provided with Maxitrol eye drops and ointment along with Augmentin 7day course. She reported to her PCP on 10/19 with worsening inflammation and redness to the eye and face despite treatments. At that time it was recommended she continue the drops, ointment, and oral antibiotics, and a prednisone burst was added for the inflammation. It was recommended she continue with cool compresses, as well.   She tells me today that she finished the prednisone on Saturday and the antibiotic on Sunday.  She reports that the swelling and redness in her face returned Sunday afternoon and have progressively worsened since that time.  She does tell me that she has a history of reaction similar to this with bee stings and often she ends up with cellulitis of the area of the sting.   She has been using the compresses and medication as prescribed.  She denies fever, chills, nausea, vomiting, dizziness, decreased vision, shortness of breath, swelling in her throat.   Past Medical History:  Diagnosis Date  . Asthma   . Essential hypertension 01/14/2015  . GERD (gastroesophageal reflux disease)   . Hydronephrosis, right    chronic  . IBS (irritable bowel syndrome)   . Incarcerated epigastric hernia 01/10/2017  . Murmur     Past Surgical History:  Procedure Laterality Date  . HAND SURGERY     LT 2002  . INSERTION OF MESH N/A 01/10/2017    Procedure: INSERTION OF MESH;  Surgeon: Claud Kelp, MD;  Location: [facility] OR;  Service: General;  Laterality: N/A;  . PYELOPLASTY     RT kidney 1993  . pyleoplasty     right kidney  . TONSILLECTOMY    . VENTRAL HERNIA REPAIR N/A 01/10/2017   Procedure: LAPAROSCOPIC REPAIR EPIGASTRIC HERNIA WITH MESH;  Surgeon: Claud Kelp, MD;  Location: [facility] OR;  Service: General;  Laterality: N/A;    Family History  Problem Relation Age of Onset  . Alcohol abuse Father   . Depression Father   . Stroke Father   . Hypertension Father   . Heart attack Father   . Esophageal cancer Father   . Breast cancer Mother        metastatic to lungs  . Heart disease Mother   . Hypertension Mother   . Cancer Mother        breast and lung  . Heart attack Mother   . Mental illness Paternal Aunt     Social History   Socioeconomic History  . Marital status: Married    Spouse name: Not on file  . Number of children: Not on file  . Years of education: Not on file  . Highest education level: Not on file  Occupational History  . Occupation: Teacher, adult education: [employer]    Comment: Works at [employer]  Tobacco Use  . Smoking status:  Never Smoker  . Smokeless tobacco: Never Used  Vaping Use  . Vaping Use: Never used  Substance and Sexual Activity  . Alcohol use: Yes    Comment: occasionally  . Drug use: No  . Sexual activity: Yes  Other Topics Concern  . Not on file  Social History Narrative  . Not on file   Social Determinants of Health   Financial Resource Strain:   . Difficulty of Paying Living Expenses: Not on file  Food Insecurity:   . Worried About Programme researcher, broadcasting/film/video in the Last Year: Not on file  . Ran Out of Food in the Last Year: Not on file  Transportation Needs:   . Lack of Transportation (Medical): Not on file  . Lack of Transportation (Non-Medical): Not on file  Physical Activity:   . Days of Exercise per Week: Not on file  . Minutes of Exercise per Session: Not on file  Stress:     . Feeling of Stress : Not on file  Social Connections:   . Frequency of Communication with Friends and Family: Not on file  . Frequency of Social Gatherings with Friends and Family: Not on file  . Attends Religious Services: Not on file  . Active Member of Clubs or Organizations: Not on file  . Attends Banker Meetings: Not on file  . Marital Status: Not on file  Intimate Partner Violence:   . Fear of Current or Ex-Partner: Not on file  . Emotionally Abused: Not on file  . Physically Abused: Not on file  . Sexually Abused: Not on file    Outpatient Medications Prior to Visit  Medication Sig Dispense Refill  . acetaminophen (TYLENOL) 325 MG tablet Take 650 mg by mouth every 6 (six) hours as needed for headache.    . albuterol (VENTOLIN HFA) 108 (90 Base) MCG/ACT inhaler INHALE 2 PUFFS INTO THE LUNGS EVERY 6 (SIX) HOURS AS NEEDED FOR WHEEZING. 36 g 11  . Azelaic Acid 15 % cream Apply 1 application topically 2 (two) times daily. 30 g 11  . cyclobenzaprine (FLEXERIL) 10 MG tablet Take 1 tablet (10 mg total) by mouth 2 (two) times daily as needed for muscle spasms. 20 tablet 0  . fluticasone (FLONASE) 50 MCG/ACT nasal spray One spray in each nostril twice a day, use left hand for right nostril, and right hand for left nostril. (Patient taking differently: Place 1 spray into both nostrils 2 (two) times daily as needed (seasonal allergies). ) 48 g 3  . irbesartan (AVAPRO) 150 MG tablet TAKE 1 TABLET BY MOUTH ONCE DAILY 30 tablet 2  . levocetirizine (XYZAL) 5 MG tablet Take 1 tablet (5 mg total) by mouth every evening. 90 tablet 3  . neomycin-polymyxin b-dexamethasone (MAXITROL) 3.5-10000-0.1 OINT Place 1 application into both eyes 2 (two) times daily.    . QUEtiapine (SEROQUEL) 50 MG tablet TAKE 1 TABLET (50 MG TOTAL) BY MOUTH AT BEDTIME. 30 tablet 3  . SOOLANTRA 1 % CREA Apply topically daily.    . predniSONE (DELTASONE) 50 MG tablet Take 1 tablet (50 mg total) by mouth daily.  (Patient not taking: Reported on 02/24/2020) 5 tablet 0   No facility-administered medications prior to visit.    Allergies  Allergen Reactions  . Bee Venom Dermatitis and Other (See Comments)    Cellulitis/staph infections following bee stings  . Sulfonamide Derivatives Rash    Blisters in mucous membranes    Review of Systems     Objective:  Physical Exam Vitals and nursing note reviewed.  Constitutional:      Appearance: She is ill-appearing.  HENT:     Nose: Nose normal.     Mouth/Throat:     Mouth: Mucous membranes are moist.     Pharynx: Oropharynx is clear.  Eyes:     General: Vision grossly intact. Gaze aligned appropriately. No allergic shiner, visual field deficit or scleral icterus.    Extraocular Movements: Extraocular movements intact.     Right eye: Normal extraocular motion.     Left eye: Normal extraocular motion.     Conjunctiva/sclera: Conjunctivae normal.     Pupils: Pupils are equal, round, and reactive to light.      Comments: Upon close inspection with magnification identification of a foreign body noted within the punctum of the bee sting site.  Area cleansed and scraping technique utilized to attempt to remove foreign body.  Serous fluid expressed from the punctum with no visualization of any additional foreign bodies noted.  Cardiovascular:     Rate and Rhythm: Normal rate.     Pulses: Normal pulses.     Heart sounds: Normal heart sounds.  Pulmonary:     Effort: Pulmonary effort is normal.     Breath sounds: Normal breath sounds.  Musculoskeletal:        General: Swelling and tenderness present. Normal range of motion.     Cervical back: Normal range of motion. No rigidity.     Right lower leg: No edema.     Left lower leg: No edema.  Lymphadenopathy:     Cervical: No cervical adenopathy.  Skin:    General: Skin is warm and dry.     Capillary Refill: Capillary refill takes less than 2 seconds.     Findings: Erythema present.    Neurological:     General: No focal deficit present.     Mental Status: She is alert and oriented to person, place, and time.     Cranial Nerves: No cranial nerve deficit.     Coordination: Coordination normal.  Psychiatric:        Mood and Affect: Mood normal.        Behavior: Behavior normal.        Thought Content: Thought content normal.        Judgment: Judgment normal.     BP 106/73   Pulse 84   Temp 98.3 F (36.8 C) (Oral)   Ht 5\' 1"  (1.549 m)   Wt 158 lb 4.8 oz (71.8 kg)   SpO2 97%   BMI 29.91 kg/m  Wt Readings from Last 3 Encounters:  02/24/20 158 lb 4.8 oz (71.8 kg)  01/08/20 160 lb (72.6 kg)  09/11/19 147 lb (66.7 kg)    There are no preventive care reminders to display for this patient.  There are no preventive care reminders to display for this patient.   Lab Results  Component Value Date   TSH 1.65 01/30/2017   Lab Results  Component Value Date   WBC 6.0 07/04/2019   HGB 13.7 07/04/2019   HCT 40.5 07/04/2019   MCV 92.3 07/04/2019   PLT 238 07/04/2019   Lab Results  Component Value Date   NA 140 07/04/2019   K 4.4 07/04/2019   CO2 27 07/04/2019   GLUCOSE 76 07/04/2019   BUN 15 07/04/2019   CREATININE 0.79 07/04/2019   BILITOT 0.4 07/04/2019   ALKPHOS 57 01/06/2017   AST 18 07/04/2019   ALT 27 07/04/2019  PROT 6.4 07/04/2019   ALBUMIN 3.8 01/06/2017   CALCIUM 9.4 07/04/2019   ANIONGAP 6 01/06/2017   Lab Results  Component Value Date   CHOL 165 02/09/2015   Lab Results  Component Value Date   HDL 35 (L) 02/09/2015   Lab Results  Component Value Date   LDLCALC 118 02/09/2015   Lab Results  Component Value Date   TRIG 61 02/09/2015   Lab Results  Component Value Date   CHOLHDL 4.7 02/09/2015   Lab Results  Component Value Date   HGBA1C 5.9 (H) 02/09/2015       Assessment & Plan:   1. Bee sting, accidental or unintentional, subsequent encounter Subsequent evaluation of bee sting to the left eyelid.  There is  significant erythema and edema noted bilaterally to the face along the cheeks nose and eyelids. 5 days of prednisone 50 mg and 7 days of Augmentin-completed at this time. Given the significant return of symptoms with the stop of the prednisone and antibiotic we will treat with a 7-day prednisone taper starting at 60 mg and pending with 20mg .  Prescription for doxycycline provided in the event that the erythema and edema are not completely resolved with the use of the prednisone.  Given the patient's history of infectious cellulitis with previous bee stings I feel that additional antibiotic therapy is appropriate. Recommend hydroxyzine at bedtime with 50 to 100 mg dose.  Discussed with the patient that she may attempt to take this during the day however it would most likely make her sleepy. Prescription provided for mometasone to be used over the sting site with a very thin layer for no more than 5 days. Prescription for EpiPen provided for severe reaction to bee stings-given that the patient's reactions are worsening with each sting it is very likely that she may have an anaphylactic reaction in the future. Prescription provided for fluconazole to be taken after antibiotic therapy is completed due to history of vaginal yeast infections with antibiotic use. Recommend that the patient follow-up if symptoms worsen or fail to improve or if they improve once again and return. - hydrOXYzine (ATARAX/VISTARIL) 50 MG tablet; Take 1 tablet (50 mg total) by mouth 3 (three) times daily as needed. May take two doses at bedtime if needed for itching.  Dispense: 30 tablet; Refill: 3 - mometasone (ELOCON) 0.1 % cream; Apply 1 application topically daily.  Dispense: 45 g; Refill: 0 - predniSONE (DELTASONE) 20 MG tablet; Day 1-2 take 3 tabs (60mg ) with breakfast, Day 3-4 take 2 tabs (40mg ) with breakfast, Day 5-7 take 1 tab (20mg ) with breakfast.  Dispense: 13 tablet; Refill: 0 - doxycycline (VIBRA-TABS) 100 MG tablet;  Take 1 tablet (100 mg total) by mouth 2 (two) times daily.  Dispense: 10 tablet; Refill: 0 - EPINEPHrine 0.3 mg/0.3 mL IJ SOAJ injection; Inject 0.3 mg into the muscle as needed for anaphylaxis.  Dispense: 0.6 mL; Refill: 0 - fluconazole (DIFLUCAN) 150 MG tablet; Take 1 tablet (150 mg total) by mouth once for 1 dose. Repeat dose 72 hours if yeast infection persists  Dispense: 2 tablet; Refill: 1   Tollie EthSara E. Emmerson Taddei, NP

## 2020-03-05 ENCOUNTER — Other Ambulatory Visit: Payer: Self-pay | Admitting: Sports Medicine

## 2020-03-05 DIAGNOSIS — F4321 Adjustment disorder with depressed mood: Secondary | ICD-10-CM

## 2020-03-05 MED FILL — QUETIAPINE FUMARATE 50 MG T: 50 | 30 days supply | Qty: 30 | Fill #0

## 2020-03-05 MED FILL — IRBESARTAN 150 MG TAB: 150 | 30 days supply | Qty: 30 | Fill #0

## 2020-04-01 MED FILL — IRBESARTAN 150 MG TAB: 150 | 30 days supply | Qty: 30 | Fill #1

## 2020-04-01 MED FILL — QUETIAPINE FUMARATE 50 MG T: 50 | 30 days supply | Qty: 30 | Fill #1

## 2020-05-07 MED FILL — IRBESARTAN 150 MG TAB: 150 | 30 days supply | Qty: 30 | Fill #2

## 2020-05-07 MED FILL — QUETIAPINE FUMARATE 50 MG T: 50 | 30 days supply | Qty: 30 | Fill #2

## 2020-05-25 ENCOUNTER — Encounter: Payer: Self-pay | Admitting: Nurse Practitioner

## 2020-05-25 ENCOUNTER — Telehealth (INDEPENDENT_AMBULATORY_CARE_PROVIDER_SITE_OTHER): Payer: 59 | Admitting: Nurse Practitioner

## 2020-05-25 ENCOUNTER — Other Ambulatory Visit: Payer: Self-pay | Admitting: Nurse Practitioner

## 2020-05-25 VITALS — Temp 99.3°F

## 2020-05-25 DIAGNOSIS — N3090 Cystitis, unspecified without hematuria: Secondary | ICD-10-CM

## 2020-05-25 MED ORDER — NITROFURANTOIN MONOHYD MACRO 100 MG PO CAPS: 100.0000 mg | ORAL_CAPSULE | Freq: Two times a day (BID) | ORAL | 0 refills | Status: DC

## 2020-05-25 MED FILL — NITROFURANTOIN MONO-MCR 100: 100 | 5 days supply | Qty: 10 | Fill #0

## 2020-05-25 NOTE — Patient Instructions (Signed)
Urinary Tract Infection, Adult A urinary tract infection (UTI) is an infection of any part of the urinary tract. The urinary tract includes:  The kidneys.  The ureters.  The bladder.  The urethra. These organs make, store, and get rid of pee (urine) in the body. What are the causes? This infection is caused by germs (bacteria) in your genital area. These germs grow and cause swelling (inflammation) of your urinary tract. What increases the risk? The following factors may make you more likely to develop this condition:  Using a small, thin tube (catheter) to drain pee.  Not being able to control when you pee or poop (incontinence).  Being female. If you are female, these things can increase the risk: ? Using these methods to prevent pregnancy:  A medicine that kills sperm (spermicide).  A device that blocks sperm (diaphragm). ? Having low levels of a female hormone (estrogen). ? Being pregnant. You are more likely to develop this condition if:  You have genes that add to your risk.  You are sexually active.  You take antibiotic medicines.  You have trouble peeing because of: ? A prostate that is bigger than normal, if you are female. ? A blockage in the part of your body that drains pee from the bladder. ? A kidney stone. ? A nerve condition that affects your bladder. ? Not getting enough to drink. ? Not peeing often enough.  You have other conditions, such as: ? Diabetes. ? A weak disease-fighting system (immune system). ? Sickle cell disease. ? Gout. ? Injury of the spine. What are the signs or symptoms? Symptoms of this condition include:  Needing to pee right away.  Peeing small amounts often.  Pain or burning when peeing.  Blood in the pee.  Pee that smells bad or not like normal.  Trouble peeing.  Pee that is cloudy.  Fluid coming from the vagina, if you are female.  Pain in the belly or lower back. Other symptoms include:  Vomiting.  Not  feeling hungry.  Feeling mixed up (confused). This may be the first symptom in older adults.  Being tired and grouchy (irritable).  A fever.  Watery poop (diarrhea). How is this treated?  Taking antibiotic medicine.  Taking other medicines.  Drinking enough water. In some cases, you may need to see a specialist. Follow these instructions at home: Medicines  Take over-the-counter and prescription medicines only as told by your doctor.  If you were prescribed an antibiotic medicine, take it as told by your doctor. Do not stop taking it even if you start to feel better. General instructions  Make sure you: ? Pee until your bladder is empty. ? Do not hold pee for a long time. ? Empty your bladder after sex. ? Wipe from front to back after peeing or pooping if you are a female. Use each tissue one time when you wipe.  Drink enough fluid to keep your pee pale yellow.  Keep all follow-up visits.   Contact a doctor if:  You do not get better after 1-2 days.  Your symptoms go away and then come back. Get help right away if:  You have very bad back pain.  You have very bad pain in your lower belly.  You have a fever.  You have chills.  You feeling like you will vomit or you vomit. Summary  A urinary tract infection (UTI) is an infection of any part of the urinary tract.  This condition is caused by   germs in your genital area.  There are many risk factors for a UTI.  Treatment includes antibiotic medicines.  Drink enough fluid to keep your pee pale yellow. This information is not intended to replace advice given to you by your health care provider. Make sure you discuss any questions you have with your health care provider. Document Revised: 11/29/2019 Document Reviewed: 11/29/2019 Elsevier Patient Education  2021 Elsevier Inc.  

## 2020-05-25 NOTE — Progress Notes (Signed)
Pt stated may have UTI over the weekend experienced flank pain using  bathroom frequenty

## 2020-05-25 NOTE — Progress Notes (Signed)
Virtual Video Visit via MyChart Note  I connected with  [name] [name] on 05/25/20 at  1:30 PM EST by the video enabled telemedicine application for , MyChart, and verified that I am speaking with the correct person using two identifiers.   I introduced myself as a Publishing rights manager with the practice. We discussed the limitations of evaluation and management by telemedicine and the availability of in person appointments. The patient expressed understanding and agreed to proceed.  Participating parties in this visit include: The patient and the nurse practitioner listed and nurse practitioner observing, Hyman Hopes. The patient is: At home I am: In the office  Subjective:    CC:  Chief Complaint  Patient presents with  . Urinary Tract Infection    HPI: [name] [name] is a 55 y.o. year old female presenting today via MyChart today for UTI symptoms that started late last week and into the weekend. She endorses right lower abdominal/groin pain, colicky in nature and right sided flank pain. Temp 99.3 and increased fatigue yesterday along with urinary frequency, dysuria, and darker colored urine.   She denies bloody or malodorous urine.   She does have a history of chronic hydronephrosis and has had previous surgeries. She has not had a UTI in a while, but this does feel consistent with her usual UTI symptoms.   She has been taking tylenol and increased fluid intake. No other treatments.   Past medical history, Surgical history, Family history not pertinant except as noted below, Social history, Allergies, and medications have been entered into the medical record, reviewed, and corrections made.   Review of Systems:  All review of systems negative except what is listed in the HPI   Objective:    General:  Speaking clearly in complete sentences. Absent shortness of breath noted.   Alert and oriented x3.   Normal judgment.  Absent acute distress.   Impression and Recommendations:     1. Cystitis - nitrofurantoin, macrocrystal-monohydrate, (MACROBID) 100 MG capsule; Take 1 capsule (100 mg total) by mouth 2 (two) times daily.  Dispense: 10 capsule; Refill: 0  Symptoms and presentation consistent with UTI. Due to fevers, want to start treatment immediately in the event the infection may be going to the kidneys. Will start with nitrofurantoin BID. Recommend increased hydration and rest. May also take AZO for symptom management. Discussed with patient if her symptoms are any worse or not improving at all after 2 days on antibiotic therapy to let me know and we will send urine for culture. Do not want culture to delay care today.    Follow-up if symptoms worsen or fail to improve.    I discussed the assessment and treatment plan with the patient. The patient was provided an opportunity to ask questions and all were answered. The patient agreed with the plan and demonstrated an understanding of the instructions.   The patient was advised to call back or seek an in-person evaluation if the symptoms worsen or if the condition fails to improve as anticipated.  I provided 20 minutes of non-face-to-face interaction with this MYCHART visit including intake, same-day documentation, and chart review.   Tollie Eth, NP

## 2020-06-05 ENCOUNTER — Other Ambulatory Visit: Payer: Self-pay | Admitting: Sports Medicine

## 2020-06-05 MED FILL — IRBESARTAN 150 MG TAB: 150 | 90 days supply | Qty: 90 | Fill #0

## 2020-06-05 MED FILL — QUETIAPINE FUMARATE 50 MG T: 50 | 30 days supply | Qty: 30 | Fill #3

## 2020-06-28 ENCOUNTER — Other Ambulatory Visit: Payer: Self-pay | Admitting: Medical-Surgical

## 2020-06-29 ENCOUNTER — Other Ambulatory Visit: Payer: Self-pay | Admitting: Sports Medicine

## 2020-06-29 DIAGNOSIS — F4321 Adjustment disorder with depressed mood: Secondary | ICD-10-CM

## 2020-06-29 MED FILL — QUETIAPINE FUMARATE 50 MG T: 50 | 30 days supply | Qty: 30 | Fill #0

## 2020-07-22 ENCOUNTER — Ambulatory Visit (HOSPITAL_BASED_OUTPATIENT_CLINIC_OR_DEPARTMENT_OTHER)
Admission: RE | Admit: 2020-07-22 | Discharge: 2020-07-22 | Disposition: A | Discharge status: Home / Self Care | Payer: 59 | Source: Ambulatory Visit | Attending: Sports Medicine | Admitting: Sports Medicine

## 2020-07-22 ENCOUNTER — Other Ambulatory Visit (HOSPITAL_BASED_OUTPATIENT_CLINIC_OR_DEPARTMENT_OTHER): Payer: Self-pay | Admitting: Sports Medicine

## 2020-07-22 ENCOUNTER — Other Ambulatory Visit: Payer: Self-pay

## 2020-07-22 DIAGNOSIS — Z1231 Encounter for screening mammogram for malignant neoplasm of breast: Secondary | ICD-10-CM

## 2020-07-31 MED FILL — QUETIAPINE FUMARATE 50 MG T: 50 | 30 days supply | Qty: 30 | Fill #1

## 2020-07-31 MED FILL — LEVOCETIRIZINE 5 MG TABLET: 5 | 90 days supply | Qty: 90 | Fill #1

## 2020-08-28 ENCOUNTER — Other Ambulatory Visit (HOSPITAL_BASED_OUTPATIENT_CLINIC_OR_DEPARTMENT_OTHER): Payer: Self-pay

## 2020-08-28 MED FILL — Albuterol Sulfate Inhal Aero 108 MCG/ACT (90MCG Base Equiv): RESPIRATORY_TRACT | 50 days supply | Qty: 36 | Fill #0 | Status: AC

## 2020-09-04 ENCOUNTER — Other Ambulatory Visit (HOSPITAL_BASED_OUTPATIENT_CLINIC_OR_DEPARTMENT_OTHER): Payer: Self-pay

## 2020-09-04 MED FILL — Quetiapine Fumarate Tab 50 MG: ORAL | 30 days supply | Qty: 30 | Fill #0 | Status: AC

## 2020-09-07 ENCOUNTER — Other Ambulatory Visit: Payer: Self-pay | Admitting: *Deleted

## 2020-09-07 ENCOUNTER — Other Ambulatory Visit (HOSPITAL_BASED_OUTPATIENT_CLINIC_OR_DEPARTMENT_OTHER): Payer: Self-pay

## 2020-09-07 DIAGNOSIS — F4321 Adjustment disorder with depressed mood: Secondary | ICD-10-CM

## 2020-09-07 MED ORDER — QUETIAPINE FUMARATE 50 MG PO TABS
ORAL_TABLET | Freq: Every day | ORAL | 3 refills | Status: DC
  Filled 2020-09-07 – 2020-09-11 (×2): qty 30, 30d supply, fill #0
  Filled 2020-10-09: qty 30, 30d supply, fill #1
  Filled 2020-11-09: qty 30, 30d supply, fill #2
  Filled 2020-12-17: qty 30, 30d supply, fill #3

## 2020-09-07 NOTE — Telephone Encounter (Signed)
No problem, I can refill this one.

## 2020-09-07 NOTE — Telephone Encounter (Signed)
Pt left vm stating that while cleaning her bedroom and throwing away the old bedroom suit she accidentally threw her bottle of Seroquel away and needs a refill.  She stated that she realized she'd thrown it away and they returned home from the dump.

## 2020-09-11 ENCOUNTER — Other Ambulatory Visit (HOSPITAL_COMMUNITY): Payer: Self-pay

## 2020-09-11 ENCOUNTER — Other Ambulatory Visit (HOSPITAL_BASED_OUTPATIENT_CLINIC_OR_DEPARTMENT_OTHER): Payer: Self-pay

## 2020-09-30 ENCOUNTER — Other Ambulatory Visit (HOSPITAL_BASED_OUTPATIENT_CLINIC_OR_DEPARTMENT_OTHER): Payer: Self-pay

## 2020-09-30 ENCOUNTER — Other Ambulatory Visit: Payer: Self-pay | Admitting: Sports Medicine

## 2020-09-30 MED ORDER — IRBESARTAN 150 MG PO TABS
ORAL_TABLET | Freq: Every day | ORAL | 2 refills | Status: DC
  Filled 2020-09-30: qty 30, 30d supply, fill #0
  Filled 2020-11-09: qty 30, 30d supply, fill #1
  Filled 2020-12-17: qty 30, 30d supply, fill #2

## 2020-10-09 ENCOUNTER — Other Ambulatory Visit (HOSPITAL_BASED_OUTPATIENT_CLINIC_OR_DEPARTMENT_OTHER): Payer: Self-pay

## 2020-11-10 ENCOUNTER — Other Ambulatory Visit (HOSPITAL_BASED_OUTPATIENT_CLINIC_OR_DEPARTMENT_OTHER): Payer: Self-pay

## 2020-11-11 DIAGNOSIS — H43813 Vitreous degeneration, bilateral: Secondary | ICD-10-CM | POA: Diagnosis not present

## 2020-11-16 ENCOUNTER — Telehealth: Payer: 59 | Admitting: Physician Assistant

## 2020-11-16 DIAGNOSIS — U071 COVID-19: Secondary | ICD-10-CM

## 2020-11-16 MED ORDER — AZELASTINE HCL 0.1 % NA SOLN
2.0000 | Freq: Two times a day (BID) | NASAL | 0 refills | Status: DC
  Filled 2020-11-16: qty 30, 50d supply, fill #0

## 2020-11-16 MED ORDER — BENZONATATE 100 MG PO CAPS
100.0000 mg | ORAL_CAPSULE | Freq: Three times a day (TID) | ORAL | 0 refills | Status: DC | PRN
Start: 2020-11-16 — End: 2020-12-10
  Filled 2020-11-16: qty 30, 10d supply, fill #0

## 2020-11-16 NOTE — Progress Notes (Signed)
E-Visit  for Positive Covid Test Result  We are sorry you are not feeling well. We are here to help!  You have tested positive for COVID-19, meaning that you were infected with the novel coronavirus and could give the virus to others.  It is vitally important that you stay home so you do not spread it to others.      Please continue isolation at home, for at least 10 days since the start of your symptoms and until you have had 24 hours with no fever (without taking a fever reducer) and with improving of symptoms.  If you have no symptoms but tested positive (or all symptoms resolve after 5 days and you have no fever) you can leave your house but continue to wear a mask around others for an additional 5 days. If you have a fever,continue to stay home until you have had 24 hours of no fever. Most cases improve 5-10 days from onset but we have seen a small number of patients who have gotten worse after the 10 days.  Please be sure to watch for worsening symptoms and remain taking the proper precautions.   Go to the nearest hospital ED for assessment if fever/cough/breathlessness are severe or illness seems like a threat to life.    The following symptoms may appear 2-14 days after exposure: Fever Cough Shortness of breath or difficulty breathing Chills Repeated shaking with chills Muscle pain Headache Sore throat New loss of taste or smell Fatigue Congestion or runny nose Nausea or vomiting Diarrhea  You have been enrolled in MyChart Home Monitoring for COVID-19. Daily you will receive a questionnaire within the MyChart website. Our COVID-19 response team will be monitoring your responses daily.  You can use medication such as prescription cough medication called Tessalon Perles 100 mg. You may take 1-2 capsules every 8 hours as needed for cough and prescription for Azelastine nasal spray 2 sprays in each nostril twice per day  You may also take acetaminophen (Tylenol) as needed for  fever.  If you desire the FDA-emergency use antiviral medication options, we do require a video visit for that. To access a video visit please select the "Virtual Urgent Care Visit" option in the [employer].   HOME CARE: Only take medications as instructed by your medical team. Drink plenty of fluids and get plenty of rest. A steam or ultrasonic humidifier can help if you have congestion.   GET HELP RIGHT AWAY IF YOU HAVE EMERGENCY WARNING SIGNS.  Call 911 or proceed to your closest emergency facility if: You develop worsening high fever. Trouble breathing Bluish lips or face Persistent pain or pressure in the chest New confusion Inability to wake or stay awake You cough up blood. Your symptoms become more severe Inability to hold down food or fluids  This list is not all possible symptoms. Contact your medical provider for any symptoms that are severe or concerning to you.    Your e-visit answers were reviewed by a board certified advanced clinical practitioner to complete your personal care plan.  Depending on the condition, your plan could have included both over the counter or prescription medications.  If there is a problem please reply once you have received a response from your provider.  Your safety is important to [place].  If you have drug allergies check your prescription carefully.    You can use MyChart to ask questions about today's visit, request a non-urgent call back, or ask for a work  or school excuse for 24 hours related to this e-Visit. If it has been greater than 24 hours you will need to follow up with your provider, or enter a new e-Visit to address those concerns. You will get an e-mail in the next two days asking about your experience.  I hope that your e-visit has been valuable and will speed your recovery. Thank you for using e-visits.  I provided 6 minutes of non face-to-face time during this encounter for chart review and documentation.

## 2020-11-17 ENCOUNTER — Other Ambulatory Visit (HOSPITAL_BASED_OUTPATIENT_CLINIC_OR_DEPARTMENT_OTHER): Payer: Self-pay

## 2020-11-17 MED FILL — Albuterol Sulfate Inhal Aero 108 MCG/ACT (90MCG Base Equiv): RESPIRATORY_TRACT | 50 days supply | Qty: 36 | Fill #1 | Status: AC

## 2020-11-23 ENCOUNTER — Encounter (HOSPITAL_COMMUNITY): Payer: Self-pay

## 2020-11-23 ENCOUNTER — Encounter: Payer: Self-pay | Admitting: Family Medicine

## 2020-11-23 ENCOUNTER — Other Ambulatory Visit (HOSPITAL_BASED_OUTPATIENT_CLINIC_OR_DEPARTMENT_OTHER): Payer: Self-pay

## 2020-11-23 ENCOUNTER — Ambulatory Visit (HOSPITAL_COMMUNITY)
Admission: RE | Admit: 2020-11-23 | Discharge: 2020-11-23 | Disposition: A | Discharge status: Home / Self Care | Payer: 59 | Source: Ambulatory Visit | Attending: Family Medicine | Admitting: Family Medicine

## 2020-11-23 ENCOUNTER — Other Ambulatory Visit: Payer: Self-pay

## 2020-11-23 ENCOUNTER — Telehealth (INDEPENDENT_AMBULATORY_CARE_PROVIDER_SITE_OTHER): Payer: 59 | Admitting: Family Medicine

## 2020-11-23 DIAGNOSIS — R071 Chest pain on breathing: Secondary | ICD-10-CM | POA: Insufficient documentation

## 2020-11-23 DIAGNOSIS — R079 Chest pain, unspecified: Secondary | ICD-10-CM | POA: Diagnosis not present

## 2020-11-23 DIAGNOSIS — J9811 Atelectasis: Secondary | ICD-10-CM | POA: Diagnosis not present

## 2020-11-23 DIAGNOSIS — I517 Cardiomegaly: Secondary | ICD-10-CM | POA: Diagnosis not present

## 2020-11-23 MED ORDER — PREDNISONE 20 MG PO TABS
40.0000 mg | ORAL_TABLET | Freq: Every day | ORAL | 0 refills | Status: AC
Start: 2020-11-23 — End: 2020-11-29
  Filled 2020-11-23: qty 10, 5d supply, fill #0

## 2020-11-23 MED ORDER — IOHEXOL 350 MG/ML SOLN
80.0000 mL | Freq: Once | INTRAVENOUS | Status: AC | PRN
  Administered 2020-11-23: 80 mL via INTRAVENOUS

## 2020-11-23 NOTE — Progress Notes (Signed)
Virtual Video Visit via MyChart Note  I connected with  [name] [name] on 11/23/20 at  3:40 PM EDT by the video enabled telemedicine application for MyChart, and verified that I am speaking with the correct person using two identifiers.   I introduced myself as a Publishing rights manager with the practice. We discussed the limitations of evaluation and management by telemedicine and the availability of in person appointments. The patient expressed understanding and agreed to proceed.  Participating parties in this visit include: The patient and the nurse practitioner listed.  The patient is: At work - [employer] [facility] [employer] I am: In the office - [facility]  Subjective:    CC:  Chief Complaint  Patient presents with   Chest Pain    R-sided CP with inspiration, recently COVID +    HPI: [name] [name] is a 55 y.o. year old female presenting today via MyChart today for cough, right sided CP with inspiration.  Patient was positive for COVID on 11/12/2020.  She has had chest congestion, coughing, chest tightness.  Last Monday she did an E-visit and was given some Tessalon Perles and nasal spray which seems to be helping a little bit.  However cough continues to disrupt her daily routine.  She has coughing fits when talking, eating, walking.  She states the cough is not productive, but is dry and nagging.  Over the past few days she is also began to have 6 out of 10 right upper chest pain that occurs with taking deep breaths.  She is getting short of breath and dyspneic on exertion.  She denies any other chest pain, fevers, body aches, fatigue.  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  She has been having to use her albuterol inhaler about every 4 hours due to the chest tightness.   Past medical history, Surgical history, Family history not pertinant except as noted  below, Social history, Allergies, and medications have been entered into the medical record, reviewed, and corrections made.   Review of Systems:  All review of systems negative except what is listed in the HPI   Objective:    General:  Speaking clearly in complete sentences. Mild shortness of breath and frequent dry cough  noted.   Alert and oriented x3.   Normal judgment.  Absent acute distress.   Impression and Recommendations:    1. Chest pain on breathing - predniSONE (DELTASONE) 20 MG tablet; Take 2 tablets (40 mg total) by mouth daily with breakfast for 5 days.  Dispense: 10 tablet; Refill: 0 - CT Angio Chest Pulmonary Embolism (PE) W or WO Contrast - CBC, STAT - CMP, STAT - D-dimer, STAT  Concerned about PE following recent COVID infection. Patient is currently at work and prefers to get labs before considering CT scan. I am ordering (faxing orders to [facility] lab as patient is working there today) STAT d-dimer, CBC, CMP. Will go ahead and place STAT CTA Chest to rule-out PE. Would prefer her to go ahead and get this, but most definitely if the d-dimer comes back positive. If unable to get labs today for any reason, she needs to go ahead and get STAT CT or go to the ED. Unable to perform full exam as this visit is video only. Will go ahead and send in some prednisone for her. Patient aware of  signs/symptoms requiring further/urgent evaluation -  strict ED precautions given.   Follow-up if symptoms worsen or fail to improve.    I discussed the assessment and treatment plan with the patient. The patient was provided an opportunity to ask questions and all were answered. The patient agreed with the plan and demonstrated an understanding of the instructions.   The patient was advised to call back or seek an in-person evaluation if the symptoms worsen or if the condition fails to improve as anticipated.  I spent 20 minutes dedicated to the care of this patient on the date of  this encounter to include pre-visit chart review of prior notes and results, face-to-face time with the patient, and post-visit ordering of testing as indicated.   Lollie Marrow Reola Calkins, DNP, FNP-C

## 2020-11-24 ENCOUNTER — Other Ambulatory Visit (HOSPITAL_COMMUNITY)
Admission: AD | Admit: 2020-11-24 | Discharge: 2020-11-24 | Disposition: A | Discharge status: Home / Self Care | Payer: 59 | Source: Ambulatory Visit | Attending: Family Medicine | Admitting: Family Medicine

## 2020-11-24 DIAGNOSIS — R071 Chest pain on breathing: Secondary | ICD-10-CM | POA: Diagnosis not present

## 2020-11-24 LAB — CBC
HCT: 39 % (ref 36.0–46.0)
Hemoglobin: 13.3 g/dL (ref 12.0–15.0)
MCH: 32.1 pg (ref 26.0–34.0)
MCHC: 34.1 g/dL (ref 30.0–36.0)
MCV: 94.2 fL (ref 80.0–100.0)
Platelets: 265 10*3/uL (ref 150–400)
RBC: 4.14 MIL/uL (ref 3.87–5.11)
RDW: 13.4 % (ref 11.5–15.5)
WBC: 12.8 10*3/uL — ABNORMAL HIGH (ref 4.0–10.5)
nRBC: 0 % (ref 0.0–0.2)

## 2020-11-24 LAB — COMPREHENSIVE METABOLIC PANEL
ALT: 47 U/L — ABNORMAL HIGH (ref 0–44)
AST: 24 U/L (ref 15–41)
Albumin: 3.8 g/dL (ref 3.5–5.0)
Alkaline Phosphatase: 95 U/L (ref 38–126)
Anion gap: 7 (ref 5–15)
BUN: 15 mg/dL (ref 6–20)
CO2: 25 mmol/L (ref 22–32)
Calcium: 8.7 mg/dL — ABNORMAL LOW (ref 8.9–10.3)
Chloride: 107 mmol/L (ref 98–111)
Creatinine, Ser: 1.15 mg/dL — ABNORMAL HIGH (ref 0.44–1.00)
GFR, Estimated: 57 mL/min — ABNORMAL LOW (ref >60)
Glucose, Bld: 122 mg/dL — ABNORMAL HIGH (ref 70–99)
Potassium: 3.9 mmol/L (ref 3.5–5.1)
Sodium: 139 mmol/L (ref 135–145)
Total Bilirubin: 0.5 mg/dL (ref 0.3–1.2)
Total Protein: 6.5 g/dL (ref 6.5–8.1)

## 2020-11-24 LAB — D-DIMER, QUANTITATIVE: D-Dimer, Quant: 1.48 ug/mL-FEU — ABNORMAL HIGH (ref 0.00–0.50)

## 2020-11-24 NOTE — Progress Notes (Signed)
MyChart message sent: Seib,   CT was negative for PE and pneumonia. Continue the steroids, inhaler, tessalon, and symptom management for bronchitis. This is very reassuring. Did you get the labs done? For some reason they are not showing in our system. If they show on your MyChart can you screenshot and send to [place]. The CT did show some suspected mild hepatic steatosis so it would be beneficial to see what your LFTs were. Also showed borderline cardiomegaly - we could consider echo if you have persistent symptoms. If you are able, schedule a physical with Dr. Karie Schwalbe since it has been awhile. Let us know if you are not starting to notice improvement after a few days of prednisone.   Lollie Marrow Reola Calkins, DNP, FNP-C

## 2020-12-10 ENCOUNTER — Other Ambulatory Visit (HOSPITAL_BASED_OUTPATIENT_CLINIC_OR_DEPARTMENT_OTHER): Payer: Self-pay

## 2020-12-10 ENCOUNTER — Ambulatory Visit (INDEPENDENT_AMBULATORY_CARE_PROVIDER_SITE_OTHER): Payer: 59 | Admitting: Sports Medicine

## 2020-12-10 ENCOUNTER — Encounter: Payer: Self-pay | Admitting: Sports Medicine

## 2020-12-10 VITALS — BP 127/86 | HR 87 | Ht 61.0 in | Wt 161.8 lb

## 2020-12-10 DIAGNOSIS — J4521 Mild intermittent asthma with (acute) exacerbation: Secondary | ICD-10-CM | POA: Diagnosis not present

## 2020-12-10 DIAGNOSIS — R058 Other specified cough: Secondary | ICD-10-CM | POA: Diagnosis not present

## 2020-12-10 DIAGNOSIS — I10 Essential (primary) hypertension: Secondary | ICD-10-CM | POA: Diagnosis not present

## 2020-12-10 DIAGNOSIS — K7581 Nonalcoholic steatohepatitis (NASH): Secondary | ICD-10-CM

## 2020-12-10 DIAGNOSIS — I517 Cardiomegaly: Secondary | ICD-10-CM

## 2020-12-10 DIAGNOSIS — Z0000 Encounter for general adult medical examination without abnormal findings: Secondary | ICD-10-CM

## 2020-12-10 MED ORDER — BENZONATATE 200 MG PO CAPS
200.0000 mg | ORAL_CAPSULE | Freq: Two times a day (BID) | ORAL | 0 refills | Status: DC | PRN
  Filled 2020-12-10: qty 60, 30d supply, fill #0

## 2020-12-10 NOTE — Assessment & Plan Note (Signed)
Cardiomegaly was noted on a chest CT incidentally. Mild cough post COVID but no other symptoms suspicious for cardiomegaly or CHF. We will go ahead and get an echocardiogram, BNP. She does have an appointment coming up with her cardiologist as well.

## 2020-12-10 NOTE — Progress Notes (Signed)
Subjective:    CC: Annual Physical Exam  HPI:  This patient is here for their annual physical  I reviewed the past medical history, family history, social history, surgical history, and allergies today and no changes were needed.  Please see the problem list section below in epic for further details.  Past Medical History: Past Medical History:  Diagnosis Date   Asthma    Essential hypertension 01/14/2015   GERD (gastroesophageal reflux disease)    Hydronephrosis, right    chronic   IBS (irritable bowel syndrome)    Incarcerated epigastric hernia 01/10/2017   Murmur    Past Surgical History: Past Surgical History:  Procedure Laterality Date   HAND SURGERY     LT 2002   INSERTION OF MESH N/A 01/10/2017   Procedure: INSERTION OF MESH;  Surgeon: Claud Kelp, MD;  Location: [facility] OR;  Service: General;  Laterality: N/A;   PYELOPLASTY     RT kidney 1993   pyleoplasty     right kidney   TONSILLECTOMY     VENTRAL HERNIA REPAIR N/A 01/10/2017   Procedure: LAPAROSCOPIC REPAIR EPIGASTRIC HERNIA WITH MESH;  Surgeon: Claud Kelp, MD;  Location: [facility];  Service: General;  Laterality: N/A;   Social History: Social History   Socioeconomic History   Marital status: Married    Spouse name: Not on file   Number of children: Not on file   Years of education: Not on file   Highest education level: Not on file  Occupational History   Occupation: Teacher, adult education: [place]    Comment: Works at [employer]  Tobacco Use   Smoking status: Never   Smokeless tobacco: Never  Vaping Use   Vaping Use: Never used  Substance and Sexual Activity   Alcohol use: Yes    Comment: occasionally   Drug use: No   Sexual activity: Yes  Other Topics Concern   Not on file  Social History Narrative   Not on file   Social Determinants of Health   Financial Resource Strain: Not on file  Food Insecurity: Not on file  Transportation Needs: Not on file  Physical Activity: Not on file  Stress: Not on  file  Social Connections: Not on file   Family History: Family History  Problem Relation Age of Onset   Alcohol abuse Father    Depression Father    Stroke Father    Hypertension Father    Heart attack Father    Esophageal cancer Father    Breast cancer Mother        metastatic to lungs   Heart disease Mother    Hypertension Mother    Cancer Mother        breast and lung   Heart attack Mother    Mental illness Paternal Aunt    Allergies: Allergies  Allergen Reactions   Bee Venom Dermatitis and Other (See Comments)    Cellulitis/staph infections following bee stings   Sulfonamide Derivatives Rash    Blisters in mucous membranes   Medications: See med rec.  Review of Systems: No headache, visual changes, nausea, vomiting, diarrhea, constipation, dizziness, abdominal pain, skin rash, fevers, chills, night sweats, swollen lymph nodes, weight loss, chest pain, body aches, joint swelling, muscle aches, shortness of breath, mood changes, visual or auditory hallucinations.  Objective:    General: Well Developed, well nourished, and in no acute distress.  Neuro: Alert and oriented x3, extra-ocular muscles intact, sensation grossly intact. Cranial nerves II through XII  are intact, motor, sensory, and coordinative functions are all intact. HEENT: Normocephalic, atraumatic, pupils equal round reactive to light, neck supple, no masses, no lymphadenopathy, thyroid nonpalpable. Oropharynx, nasopharynx, external ear canals are unremarkable. Skin: Warm and dry, no rashes noted.  Cardiac: Regular rate and rhythm, no murmurs rubs or gallops.  Respiratory: Clear to auscultation bilaterally. Not using accessory muscles, speaking in full sentences.  Abdominal: Soft, nontender, nondistended, positive bowel sounds, no masses, no organomegaly.  Musculoskeletal: Shoulder, elbow, wrist, hip, knee, ankle stable, and with full range of motion.  Impression and Recommendations:    The patient was  counselled, risk factors were discussed, anticipatory guidance given.  Annual physical exam [name] [name] is a pleasant 55 year old female, here for her physical, she is due for Shingrix and Tdap, she is can hold off on Shingrix until she is feeling a little better and she thinks she might of had Tdap so she will search for records. She needs a referral to gynecology, last Pap was 7 years ago.   Upper airway cough syndrome [name] [name] did have COVID about a month ago, she still has a significant cough, this was much better with Jerilynn Som, I will restart her Tessalon Perles and I did explain that postinfectious cough can last 4 to 6 weeks and sometimes longer. Her exam is benign, lungs are clear. If this does not work we will consider additional work-up of her asthma including pre and postbronchodilator spirometry as well as potential treatment of GERD.  Cardiomegaly noted on CT Cardiomegaly was noted on a chest CT incidentally. Mild cough post COVID but no other symptoms suspicious for cardiomegaly or CHF. We will go ahead and get an echocardiogram, BNP. She does have an appointment coming up with her cardiologist as well.  Steatohepatitis Mild alcohol intake, BMI over 30. This is the likely cause of her hepatic steatosis noted on chest CT, she also had a mildly elevated AST, rechecking LFTs, I did give her an exercise prescription and dietary recommendations, we can revisit this in 3 months and if insufficient weight loss we will consider medication.  ___________________________________________ Ihor Austin. Benjamin Stain, M.D., ABFM., CAQSM. Primary Care and Sports Medicine [facility]  Adjunct Professor of Family Medicine  [employer]

## 2020-12-10 NOTE — Assessment & Plan Note (Signed)
[name] [name] did have COVID about a month ago, she still has a significant cough, this was much better with Jerilynn Som, I will restart her Tessalon Perles and I did explain that postinfectious cough can last 4 to 6 weeks and sometimes longer. Her exam is benign, lungs are clear. If this does not work we will consider additional work-up of her asthma including pre and postbronchodilator spirometry as well as potential treatment of GERD.

## 2020-12-10 NOTE — Patient Instructions (Signed)
Diet: Carb free. Exercise prescription: Heart rate into the mid 130s for 30 minutes 5 times a week.

## 2020-12-10 NOTE — Assessment & Plan Note (Addendum)
[name] [name] is a pleasant 55 year old female, here for her physical, she is due for Shingrix and Tdap, she is can hold off on Shingrix until she is feeling a little better and she thinks she might of had Tdap so she will search for records. She needs a referral to gynecology, last Pap was 7 years ago.

## 2020-12-10 NOTE — Assessment & Plan Note (Signed)
Mild alcohol intake, BMI over 30. This is the likely cause of her hepatic steatosis noted on chest CT, she also had a mildly elevated AST, rechecking LFTs, I did give her an exercise prescription and dietary recommendations, we can revisit this in 3 months and if insufficient weight loss we will consider medication.

## 2020-12-11 ENCOUNTER — Other Ambulatory Visit: Payer: 59

## 2020-12-11 ENCOUNTER — Ambulatory Visit: Payer: 59 | Admitting: Internal Medicine

## 2020-12-11 ENCOUNTER — Other Ambulatory Visit: Payer: Self-pay

## 2020-12-11 VITALS — BP 118/82 | HR 83 | Ht 61.0 in | Wt 162.4 lb

## 2020-12-11 DIAGNOSIS — K7581 Nonalcoholic steatohepatitis (NASH): Secondary | ICD-10-CM | POA: Diagnosis not present

## 2020-12-11 DIAGNOSIS — Z7189 Other specified counseling: Secondary | ICD-10-CM

## 2020-12-11 DIAGNOSIS — R079 Chest pain, unspecified: Secondary | ICD-10-CM | POA: Diagnosis not present

## 2020-12-11 DIAGNOSIS — R0781 Pleurodynia: Secondary | ICD-10-CM | POA: Diagnosis not present

## 2020-12-11 DIAGNOSIS — Z8616 Personal history of COVID-19: Secondary | ICD-10-CM

## 2020-12-11 DIAGNOSIS — I10 Essential (primary) hypertension: Secondary | ICD-10-CM | POA: Diagnosis not present

## 2020-12-11 DIAGNOSIS — I517 Cardiomegaly: Secondary | ICD-10-CM | POA: Diagnosis not present

## 2020-12-11 NOTE — Patient Instructions (Signed)
Medication Instructions:  CAN CONTINUE TO TAKE IBUPROFEN FOR CHEST PAIN  *If you need a refill on your cardiac medications before your next appointment, please call your pharmacy*  Lab Work: ESR, CRP, TROPONIN- TODAY  If you have labs (blood work) drawn today and your tests are completely normal, you will receive your results only by: MyChart Message (if you have MyChart) OR A paper copy in the mail If you have any lab test that is abnormal or we need to change your treatment, we will call you to review the results.  Follow-Up: At [facility], you and your health needs are our priority.  As part of our continuing mission to provide you with exceptional heart care, we have created designated Provider Care Teams.  These Care Teams include your primary Cardiologist (physician) and Advanced Practice Providers (APPs -  Physician Assistants and Nurse Practitioners) who all work together to provide you with the care you need, when you need it.  Your next appointment:   TO BE DETERMINED   The format for your next appointment:   In Person  Provider:   Cherlynn Kaiser, MD

## 2020-12-11 NOTE — Progress Notes (Signed)
Cardiology Office Note:    Date:  12/11/2020   ID:  [name] [name], DOB [birth year], MRN [id number]  PCP:  Silverio Decamp, MD  Cardiologist:  None  Electrophysiologist:  None   Referring MD: Silverio Decamp,*   Chief Complaint/Reason for Referral: Pleuritic chest pain post recent COVID 19 infection  History of Present Illness:    [name] [name] is a 55 y.o. female with a history of HTN and asthma.    1 mo since covid, intermittent chest pain with deep inspiration. Responding well to Ibuprofent 400 mg BID x 5 days. Once she stopped, pain returned.  Every time with deep breathing - intermittently with activity, shampooed carpet and noticed that with heavy breathing pain was worse. Small amount of pericardial fluid in recess around aorta noted on recent Chest CT, no PE noted. No other significant chest pathology.   The patient denies chest pressure, dyspnea at rest, palpitations, PND, orthopnea, or leg swelling. Denies cough, fever, chills. Denies nausea, vomiting. Denies syncope or presyncope. Denies dizziness or lightheadedness.  Fhx: moms side of family - heart disease, prmeature HTN and HLD, mom stroke and MI - 62  Aunt - stroke and MI - 7s - afib First cousins with premature CAD/MI  Past Medical History:  Diagnosis Date   Asthma    Essential hypertension 01/14/2015   GERD (gastroesophageal reflux disease)    Hydronephrosis, right    chronic   IBS (irritable bowel syndrome)    Incarcerated epigastric hernia 01/10/2017   Murmur     Past Surgical History:  Procedure Laterality Date   HAND SURGERY     LT 2002   INSERTION OF MESH N/A 01/10/2017   Procedure: INSERTION OF MESH;  Surgeon: Fanny Skates, MD;  Location: [place];  Service: General;  Laterality: N/A;   PYELOPLASTY     RT kidney 1993   pyleoplasty     right kidney   TONSILLECTOMY     VENTRAL HERNIA REPAIR N/A 01/10/2017   Procedure: LAPAROSCOPIC REPAIR EPIGASTRIC HERNIA WITH MESH;  Surgeon: Fanny Skates, MD;  Location: [place];  Service: General;  Laterality: N/A;    Current Medications: Current Meds  Medication Sig   acetaminophen (TYLENOL) 325 MG tablet Take 650 mg by mouth every 6 (six) hours as needed for headache.   albuterol (VENTOLIN HFA) 108 (90 Base) MCG/ACT inhaler INHALE 2 PUFFS INTO THE LUNGS EVERY 6 (SIX) HOURS AS NEEDED FOR WHEEZING.   azelastine (ASTELIN) 0.1 % nasal spray Place 2 sprays into both nostrils 2 (two) times daily.   benzonatate (TESSALON) 200 MG capsule Take 1 capsule (200 mg total) by mouth 2 (two) times daily as needed for cough.   cyclobenzaprine (FLEXERIL) 10 MG tablet Take 1 tablet (10 mg total) by mouth 2 (two) times daily as needed for muscle spasms.   EPINEPHrine 0.3 mg/0.3 mL IJ SOAJ injection INJECT 0.3 MG INTO THE MUSCLE AS NEEDED FOR ANAPHYLAXIS.   fluticasone (FLONASE) 50 MCG/ACT nasal spray One spray in each nostril twice a day, use left hand for right nostril, and right hand for left nostril. (Patient taking differently: Place 1 spray into both nostrils 2 (two) times daily as needed (seasonal allergies).)   irbesartan (AVAPRO) 150 MG tablet TAKE 1 TABLET BY MOUTH ONCE DAILY   Ivermectin 1 % CREA APPLY ONCE DAILY TO FACE   QUEtiapine (SEROQUEL) 50 MG tablet TAKE 1 TABLET (50 MG TOTAL) BY MOUTH AT BEDTIME.   SOOLANTRA 1 % CREA Apply topically  daily.     Allergies:   Bee venom and Sulfonamide derivatives   Social History   Tobacco Use   Smoking status: Never   Smokeless tobacco: Never  Vaping Use   Vaping Use: Never used  Substance Use Topics   Alcohol use: Yes    Comment: occasionally   Drug use: No     Family History: The patient's family history includes Alcohol abuse in her father; Breast cancer in her mother; Cancer in her mother; Depression in her father; Esophageal cancer in her father; Heart attack in her father and mother; Heart disease in her mother; Hypertension in her father and mother; Mental illness in her paternal aunt;  Stroke in her father.  ROS:   Please see the history of present illness.    All other systems reviewed and are negative.  EKGs/Labs/Other Studies Reviewed:    The following studies were reviewed today:  EKG:  NSR  I have independently reviewed the images from CT angio chest PE 11/23/20.  Recent Labs: 11/24/2020: ALT 47; BUN 15; Creatinine, Ser 1.15; Hemoglobin 13.3; Platelets 265; Potassium 3.9; Sodium 139  Recent Lipid Panel    Component Value Date/Time   CHOL 165 02/09/2015 0910   TRIG 61 02/09/2015 0910   HDL 35 (L) 02/09/2015 0910   CHOLHDL 4.7 02/09/2015 0910   VLDL 12 02/09/2015 0910   LDLCALC 118 02/09/2015 0910    Physical Exam:    VS:  BP 118/82 (BP Location: Left Arm, Patient Position: Sitting, Cuff Size: Normal)   Pulse 83   Ht 5' 1"  (1.549 m)   Wt 162 lb 6.4 oz (73.7 kg)   LMP 11/09/2018   BMI 30.69 kg/m     Wt Readings from Last 5 Encounters:  12/10/20 161 lb 12.8 oz (73.4 kg)  02/24/20 158 lb 4.8 oz (71.8 kg)  01/08/20 160 lb (72.6 kg)  09/11/19 147 lb (66.7 kg)  12/03/18 162 lb (73.5 kg)    Constitutional: No acute distress Eyes: sclera non-icteric, normal conjunctiva and lids ENMT: normal dentition, moist mucous membranes Cardiovascular: regular rhythm, normal rate, no murmurs. S1 and S2 normal. Radial pulses normal bilaterally. No jugular venous distention.  Respiratory: clear to auscultation bilaterally GI : normal bowel sounds, soft and nontender. No distention.   MSK: extremities warm, well perfused. No edema.  NEURO: grossly nonfocal exam, moves all extremities. PSYCH: alert and oriented x 3, normal mood and affect.   ASSESSMENT:    1. History of COVID-19   2. Pleuritic chest pain   3. Essential hypertension   4. Cardiac risk counseling    PLAN:    History of COVID-19 - Plan: Sed Rate (ESR), C-reactive protein, Troponin T Pleuritic chest pain - Plan: Sed Rate (ESR), C-reactive protein, Troponin T, EKG 12-Lead - likely represents  pericarditis from post-Covid viral illness. Will check ESR, CRP and Troponin to evaluate for any active inflammation which would guide medication and symptomatic treatment. Otherwise recovering well. Will obtain echocardiogram as well - ordered by PMD.  Essential hypertension - BP stable today on current medication - irbesartan, continue at current dose.  Health Maintenance/CV risk counseling - lipids pending at time of this visit, ordered by PMD. Will follow. Agree with treatment given family history if LDL >70.   Total time of encounter: 60 minutes total time of encounter, including 35 minutes spent in face-to-face patient care on the date of this encounter. This time includes coordination of care and counseling regarding above mentioned problem list. Remainder of  non-face-to-face time involved reviewing chart documents/testing relevant to the patient encounter and documentation in the medical record. I have independently reviewed documentation from referring provider.   Cherlynn Kaiser, MD, [facility]     Medication Adjustments/Labs and Tests Ordered: Current medicines are reviewed at length with the patient today.  Concerns regarding medicines are outlined above.   Orders Placed This Encounter  Procedures   Sed Rate (ESR)   C-reactive protein   Troponin T   Specimen status report   EKG 12-Lead    No orders of the defined types were placed in this encounter.   Patient Instructions  Medication Instructions:  CAN CONTINUE TO TAKE IBUPROFEN FOR CHEST PAIN  *If you need a refill on your cardiac medications before your next appointment, please call your pharmacy*  Lab Work: ESR, CRP, TROPONIN- TODAY  If you have labs (blood work) drawn today and your tests are completely normal, you will receive your results only by: MyChart Message (if you have MyChart) OR A paper copy in the mail If you have any lab test that is abnormal or we need to change your  treatment, we will call you to review the results.  Follow-Up: At [facility], you and your health needs are our priority.  As part of our continuing mission to provide you with exceptional heart care, we have created designated Provider Care Teams.  These Care Teams include your primary Cardiologist (physician) and Advanced Practice Providers (APPs -  Physician Assistants and Nurse Practitioners) who all work together to provide you with the care you need, when you need it.  Your next appointment:   TO BE DETERMINED   The format for your next appointment:   In Person  Provider:   Cherlynn Kaiser, MD

## 2020-12-12 LAB — CBC
HCT: 39.2 % (ref 35.0–45.0)
Hemoglobin: 13.4 g/dL (ref 11.7–15.5)
MCH: 31.6 pg (ref 27.0–33.0)
MCHC: 34.2 g/dL (ref 32.0–36.0)
MCV: 92.5 fL (ref 80.0–100.0)
MPV: 11.4 fL (ref 7.5–12.5)
Platelets: 180 10*3/uL (ref 140–400)
RBC: 4.24 10*6/uL (ref 3.80–5.10)
RDW: 13.3 % (ref 11.0–15.0)
WBC: 5.6 10*3/uL (ref 3.8–10.8)

## 2020-12-12 LAB — COMPREHENSIVE METABOLIC PANEL
AG Ratio: 1.9 (calc) (ref 1.0–2.5)
ALT: 28 U/L (ref 6–29)
AST: 16 U/L (ref 10–35)
Albumin: 4.1 g/dL (ref 3.6–5.1)
Alkaline phosphatase (APISO): 81 U/L (ref 37–153)
BUN: 13 mg/dL (ref 7–25)
CO2: 28 mmol/L (ref 20–32)
Calcium: 9 mg/dL (ref 8.6–10.4)
Chloride: 105 mmol/L (ref 98–110)
Creat: 0.88 mg/dL (ref 0.50–1.03)
Globulin: 2.2 g/dL (calc) (ref 1.9–3.7)
Glucose, Bld: 107 mg/dL — ABNORMAL HIGH (ref 65–99)
Potassium: 3.9 mmol/L (ref 3.5–5.3)
Sodium: 139 mmol/L (ref 135–146)
Total Bilirubin: 0.5 mg/dL (ref 0.2–1.2)
Total Protein: 6.3 g/dL (ref 6.1–8.1)

## 2020-12-12 LAB — LIPID PANEL
Cholesterol: 243 mg/dL — ABNORMAL HIGH (ref <200)
HDL: 50 mg/dL (ref >=50)
LDL Cholesterol (Calc): 171 mg/dL (calc) — ABNORMAL HIGH
Non-HDL Cholesterol (Calc): 193 mg/dL (calc) — ABNORMAL HIGH (ref <130)
Total CHOL/HDL Ratio: 4.9 (calc) (ref <5.0)
Triglycerides: 101 mg/dL (ref <150)

## 2020-12-12 LAB — SPECIMEN STATUS REPORT

## 2020-12-12 LAB — TSH: TSH: 2.67 mIU/L

## 2020-12-12 LAB — BRAIN NATRIURETIC PEPTIDE: Brain Natriuretic Peptide: 10 pg/mL (ref <100)

## 2020-12-15 ENCOUNTER — Encounter (INDEPENDENT_AMBULATORY_CARE_PROVIDER_SITE_OTHER): Payer: 59

## 2020-12-15 DIAGNOSIS — E782 Mixed hyperlipidemia: Secondary | ICD-10-CM

## 2020-12-15 DIAGNOSIS — Z8616 Personal history of COVID-19: Secondary | ICD-10-CM

## 2020-12-15 DIAGNOSIS — R079 Chest pain, unspecified: Secondary | ICD-10-CM | POA: Diagnosis not present

## 2020-12-15 NOTE — Telephone Encounter (Signed)
Spoke with Lab Tech Rickard Patience) regarding patients CRP/ESR. According to lab they are behind on running frozen specimens which include the ESR/CRP. Per chart the lab states that no tubes were received. Per Rickard Patience patient can return and have blood work drawn again at the office and lab will credit back the other blood work so patient is not be charged twice.  Spoke with patient, advised patient of this and she is agreeable to come and have it redrawn. Patient states she will come and do this today. New lab orders have been placed. Advised patient to call back to office with any issues, questions, or concerns. Patient verbalized understanding.

## 2020-12-16 ENCOUNTER — Other Ambulatory Visit (HOSPITAL_BASED_OUTPATIENT_CLINIC_OR_DEPARTMENT_OTHER): Payer: Self-pay

## 2020-12-16 ENCOUNTER — Other Ambulatory Visit (HOSPITAL_COMMUNITY): Payer: Self-pay | Admitting: Radiology

## 2020-12-16 DIAGNOSIS — E785 Hyperlipidemia, unspecified: Secondary | ICD-10-CM | POA: Insufficient documentation

## 2020-12-16 DIAGNOSIS — I517 Cardiomegaly: Secondary | ICD-10-CM

## 2020-12-16 LAB — C-REACTIVE PROTEIN: CRP: <1 mg/L (ref 0–10)

## 2020-12-16 LAB — SEDIMENTATION RATE: Sed Rate: 2 mm/hr (ref 0–40)

## 2020-12-16 MED ORDER — ROSUVASTATIN CALCIUM 10 MG PO TABS
10.0000 mg | ORAL_TABLET | Freq: Every day | ORAL | 3 refills | Status: DC
Start: 2020-12-16 — End: 2022-02-13
  Filled 2020-12-16: qty 90, 90d supply, fill #0
  Filled 2021-03-19: qty 90, 90d supply, fill #1
  Filled 2021-07-02: qty 90, 90d supply, fill #2
  Filled 2021-11-18: qty 90, 90d supply, fill #3

## 2020-12-16 NOTE — Assessment & Plan Note (Signed)
Lipids are elevated significantly, higher than I would expect to be able to bring down with diet and exercise, adding rosuvastatin 10 mg nightly, recheck fasting lipids in 3 months.

## 2020-12-16 NOTE — Telephone Encounter (Signed)
I spent 5 total minutes of online digital evaluation and management services. 

## 2020-12-17 ENCOUNTER — Other Ambulatory Visit (HOSPITAL_BASED_OUTPATIENT_CLINIC_OR_DEPARTMENT_OTHER): Payer: Self-pay

## 2020-12-18 ENCOUNTER — Ambulatory Visit (HOSPITAL_COMMUNITY)
Admission: RE | Admit: 2020-12-18 | Discharge: 2020-12-18 | Disposition: A | Discharge status: Home / Self Care | Payer: 59 | Source: Ambulatory Visit | Attending: Sports Medicine | Admitting: Sports Medicine

## 2020-12-18 ENCOUNTER — Other Ambulatory Visit: Payer: Self-pay

## 2020-12-18 DIAGNOSIS — I517 Cardiomegaly: Secondary | ICD-10-CM | POA: Diagnosis present

## 2020-12-18 LAB — ECHOCARDIOGRAM COMPLETE
Area-P 1/2: 2.9 cm2
S' Lateral: 2.4 cm

## 2020-12-23 LAB — TROPONIN T: Troponin T (Highly Sensitive): <6 ng/L (ref 0–14)

## 2020-12-23 LAB — C-REACTIVE PROTEIN: CRP: <1 mg/L (ref 0–10)

## 2020-12-23 LAB — SEDIMENTATION RATE

## 2021-01-18 ENCOUNTER — Other Ambulatory Visit: Payer: Self-pay | Admitting: Sports Medicine

## 2021-01-18 ENCOUNTER — Other Ambulatory Visit (HOSPITAL_BASED_OUTPATIENT_CLINIC_OR_DEPARTMENT_OTHER): Payer: Self-pay

## 2021-01-18 ENCOUNTER — Encounter (INDEPENDENT_AMBULATORY_CARE_PROVIDER_SITE_OTHER): Payer: 59

## 2021-01-18 DIAGNOSIS — F40243 Fear of flying: Secondary | ICD-10-CM | POA: Diagnosis not present

## 2021-01-18 DIAGNOSIS — F4321 Adjustment disorder with depressed mood: Secondary | ICD-10-CM

## 2021-01-18 MED ORDER — TRIAZOLAM 0.25 MG PO TABS
ORAL_TABLET | ORAL | 0 refills | Status: DC
  Filled 2021-01-18: qty 2, 2d supply, fill #0

## 2021-01-18 MED ORDER — QUETIAPINE FUMARATE 50 MG PO TABS
ORAL_TABLET | Freq: Every day | ORAL | 3 refills | Status: DC
  Filled 2021-01-18: qty 30, 30d supply, fill #0
  Filled 2021-02-15: qty 30, 30d supply, fill #1
  Filled 2021-03-17: qty 30, 30d supply, fill #2
  Filled 2021-04-20: qty 30, 30d supply, fill #3

## 2021-01-18 MED ORDER — IRBESARTAN 150 MG PO TABS
ORAL_TABLET | Freq: Every day | ORAL | 2 refills | Status: DC
  Filled 2021-01-18: qty 30, 30d supply, fill #0
  Filled 2021-02-15: qty 30, 30d supply, fill #1
  Filled 2021-03-17: qty 30, 30d supply, fill #2

## 2021-01-18 NOTE — Telephone Encounter (Signed)
I spent 5 total minutes of online digital evaluation and management services. 

## 2021-01-20 ENCOUNTER — Ambulatory Visit: Payer: 59 | Admitting: Sports Medicine

## 2021-01-26 ENCOUNTER — Ambulatory Visit (INDEPENDENT_AMBULATORY_CARE_PROVIDER_SITE_OTHER): Payer: 59 | Admitting: Sports Medicine

## 2021-01-26 ENCOUNTER — Other Ambulatory Visit (HOSPITAL_BASED_OUTPATIENT_CLINIC_OR_DEPARTMENT_OTHER): Payer: Self-pay

## 2021-01-26 ENCOUNTER — Encounter: Payer: Self-pay | Admitting: Sports Medicine

## 2021-01-26 DIAGNOSIS — K7581 Nonalcoholic steatohepatitis (NASH): Secondary | ICD-10-CM | POA: Diagnosis not present

## 2021-01-26 DIAGNOSIS — R058 Other specified cough: Secondary | ICD-10-CM | POA: Diagnosis not present

## 2021-01-26 MED ORDER — PHENTERMINE HCL 37.5 MG PO TABS
ORAL_TABLET | ORAL | 0 refills | Status: DC
  Filled 2021-01-26: qty 30, 30d supply, fill #0

## 2021-01-26 NOTE — Assessment & Plan Note (Signed)
Resolved with time and a bit of benzonatate.

## 2021-01-26 NOTE — Assessment & Plan Note (Signed)
[name] [name] did really well initially with an exercise prescription, lost 5 pounds and then went on vacation. She still 2 pounds down but she is agreeable to try medication, will start phentermine with monthly weight checks and refills.

## 2021-01-26 NOTE — Progress Notes (Signed)
GYNECOLOGY ANNUAL PREVENTATIVE CARE ENCOUNTER NOTE  History:     [name] [name] is a 55 y.o. G2P2 female here for a routine annual gynecologic exam.  Current complaints: none.     Denies abnormal vaginal bleeding, discharge, pelvic pain, problems with intercourse or other gynecologic concerns.   She works as a Engineer, civil (consulting) in [employer].   Gynecologic History Patient's last menstrual period was 11/09/2018.  Last Pap: 06/2013. Result was normal with negative HPV Last Mammogram: 06/2020.  Result was normal Last Colonoscopy: didn't do one - does cologard - last done at age 41  Obstetric History OB History  Gravida Para Term Preterm AB Living  2 2          SAB IAB Ectopic Multiple Live Births               # Outcome Date GA Lbr Len/2nd Weight Sex Delivery Anes PTL Lv  2 Para           1 Para             Past Medical History:  Diagnosis Date   Asthma    Essential hypertension 01/14/2015   GERD (gastroesophageal reflux disease)    Hydronephrosis, right    chronic   IBS (irritable bowel syndrome)    Incarcerated epigastric hernia 01/10/2017   Murmur     Past Surgical History:  Procedure Laterality Date   HAND SURGERY     LT 2002   INSERTION OF MESH N/A 01/10/2017   Procedure: INSERTION OF MESH;  Surgeon: Claud Kelp, MD;  Location: [facility] OR;  Service: General;  Laterality: N/A;   PYELOPLASTY     RT kidney 1993   pyleoplasty     right kidney   TONSILLECTOMY     VENTRAL HERNIA REPAIR N/A 01/10/2017   Procedure: LAPAROSCOPIC REPAIR EPIGASTRIC HERNIA WITH MESH;  Surgeon: Claud Kelp, MD;  Location: [facility] OR;  Service: General;  Laterality: N/A;    Current Outpatient Medications on File Prior to Visit  Medication Sig Dispense Refill   acetaminophen (TYLENOL) 325 MG tablet Take 650 mg by mouth every 6 (six) hours as needed for headache.     albuterol (VENTOLIN HFA) 108 (90 Base) MCG/ACT inhaler INHALE 2 PUFFS INTO THE LUNGS EVERY 6 (SIX) HOURS AS NEEDED FOR WHEEZING. 36 g 11    cyclobenzaprine (FLEXERIL) 10 MG tablet Take 1 tablet (10 mg total) by mouth 2 (two) times daily as needed for muscle spasms. 20 tablet 0   EPINEPHrine 0.3 mg/0.3 mL IJ SOAJ injection INJECT 0.3 MG INTO THE MUSCLE AS NEEDED FOR ANAPHYLAXIS. 2 each 0   fluticasone (FLONASE) 50 MCG/ACT nasal spray One spray in each nostril twice a day, use left hand for right nostril, and right hand for left nostril. (Patient taking differently: Place 1 spray into both nostrils 2 (two) times daily as needed (seasonal allergies).) 48 g 3   irbesartan (AVAPRO) 150 MG tablet TAKE 1 TABLET BY MOUTH ONCE DAILY 30 tablet 2   Ivermectin 1 % CREA APPLY ONCE DAILY TO FACE 45 g 1   phentermine (ADIPEX-P) 37.5 MG tablet Take 1 tablet by mouth every morning 30 tablet 0   QUEtiapine (SEROQUEL) 50 MG tablet TAKE 1 TABLET (50 MG TOTAL) BY MOUTH AT BEDTIME. 30 tablet 3   rosuvastatin (CRESTOR) 10 MG tablet Take 1 tablet (10 mg total) by mouth daily. 90 tablet 3   SOOLANTRA 1 % CREA Apply topically daily.     triazolam (HALCION)  0.25 MG tablet Take 1/2-1 tablet by mouth 2 hours prior to flying.  Do not drive with this medication. 2 tablet 0   No current facility-administered medications on file prior to visit.    Allergies  Allergen Reactions   Bee Venom Dermatitis and Other (See Comments)    Cellulitis/staph infections following bee stings   Sulfonamide Derivatives Rash    Blisters in mucous membranes    Social History:  reports that she has never smoked. She has never used smokeless tobacco. She reports current alcohol use. She reports that she does not use drugs.  Family History  Problem Relation Age of Onset   Alcohol abuse Father    Depression Father    Stroke Father    Hypertension Father    Heart attack Father    Esophageal cancer Father    Breast cancer Mother        metastatic to lungs   Heart disease Mother    Hypertension Mother    Cancer Mother        breast and lung   Heart attack Mother    Mental  illness Paternal Aunt     The following portions of the patient's history were reviewed and updated as appropriate: allergies, current medications, past family history, past medical history, past social history, past surgical history and problem list.  Review of Systems Pertinent items noted in HPI and remainder of comprehensive ROS otherwise negative.  Physical Exam:  BP 107/68   Pulse 98   Ht 5\' 1"  (1.549 m)   Wt 159 lb (72.1 kg)   LMP 11/09/2018   BMI 30.04 kg/m  CONSTITUTIONAL: Well-developed, well-nourished female in no acute distress.  HENT:  Normocephalic, atraumatic, External right and left ear normal.  EYES: Conjunctivae and EOM are normal. Pupils are equal, round, and reactive to light. No scleral icterus.  NECK: Normal range of motion, supple, no masses.  Normal thyroid.  SKIN: Skin is warm and dry. No rash noted. Not diaphoretic. No erythema. No pallor. MUSCULOSKELETAL: Normal range of motion. No tenderness.  No cyanosis, clubbing, or edema. NEUROLOGIC: Alert and oriented to person, place, and time. Normal reflexes, muscle tone coordination.  PSYCHIATRIC: Normal mood and affect. Normal behavior. Normal judgment and thought content.  CARDIOVASCULAR: Normal heart rate noted, regular rhythm RESPIRATORY: Clear to auscultation bilaterally. Effort and breath sounds normal, no problems with respiration noted.  BREASTS: Symmetric in size. No masses, tenderness, skin changes, nipple drainage, or lymphadenopathy bilaterally. Performed in the presence of a chaperone. ABDOMEN: Soft, no distention noted.  No tenderness, rebound or guarding.  PELVIC: External genitalia normal, Vagina normal without discharge, Urethra without abnormality or discharge, no bladder tenderness, cervix normal in appearance, no CMT, uterus normal size, shape, and consistency, no adnexal masses or tenderness. Performed in the presence of a chaperone.    Assessment and Plan:  Terrance was seen today for annual  exam.  Diagnoses and all orders for this visit:  Encounter for annual routine gynecological examination -     Cytology - PAP - Cervical cancer screening: Discussed guidelines. Pap with HPV done  - Breast Health: Encouraged self breast awareness/SBE. Discussed limits of clinical breast exam for detecting breast cancer. Discussed importance of annual MXR.  Rx given - Climacteric/Sexual health: Reviewed typical and atypical symptoms of menopause/peri-menopause. Discussed PMB and to call if any amount of spotting.  - Bone Health: Calcium via diet and supplementation. Discussed weight bearing exercise. DEXA not yet due - Colonoscopy: Per PCP - F/U  12 months and prn   Routine preventative health maintenance measures emphasized. Please refer to After Visit Summary for other counseling recommendations.      Milas Hock, MD, FACOG Obstetrician & Gynecologist, [facility] for [facility], [facility]

## 2021-01-26 NOTE — Progress Notes (Signed)
    Procedures performed today:    None.  Independent interpretation of notes and tests performed by another provider:   None.  Brief History, Exam, Impression, and Recommendations:    Upper airway cough syndrome Resolved with time and a bit of benzonatate.  Steatohepatitis Fiana did really well initially with an exercise prescription, lost 5 pounds and then went on vacation. She still 2 pounds down but she is agreeable to try medication, will start phentermine with monthly weight checks and refills.    ___________________________________________ Ihor Austin. Benjamin Stain, M.D., ABFM., CAQSM. Primary Care and Sports Medicine [facility]  Adjunct Instructor of Family Medicine  [employer]

## 2021-01-28 ENCOUNTER — Encounter: Payer: Self-pay | Admitting: Obstetrics and Gynecology

## 2021-01-28 ENCOUNTER — Other Ambulatory Visit: Payer: Self-pay

## 2021-01-28 ENCOUNTER — Ambulatory Visit (INDEPENDENT_AMBULATORY_CARE_PROVIDER_SITE_OTHER): Payer: 59 | Admitting: Obstetrics and Gynecology

## 2021-01-28 ENCOUNTER — Other Ambulatory Visit (HOSPITAL_COMMUNITY)
Admission: RE | Admit: 2021-01-28 | Discharge: 2021-01-28 | Disposition: A | Discharge status: Home / Self Care | Payer: 59 | Source: Ambulatory Visit | Attending: Obstetrics and Gynecology | Admitting: Obstetrics and Gynecology

## 2021-01-28 VITALS — BP 107/68 | HR 98 | Ht 61.0 in | Wt 159.0 lb

## 2021-01-28 DIAGNOSIS — Z01419 Encounter for gynecological examination (general) (routine) without abnormal findings: Secondary | ICD-10-CM

## 2021-01-29 LAB — CYTOLOGY - PAP
Comment: NEGATIVE
Diagnosis: NEGATIVE
High risk HPV: NEGATIVE

## 2021-02-15 ENCOUNTER — Other Ambulatory Visit (HOSPITAL_BASED_OUTPATIENT_CLINIC_OR_DEPARTMENT_OTHER): Payer: Self-pay

## 2021-02-24 ENCOUNTER — Other Ambulatory Visit (HOSPITAL_BASED_OUTPATIENT_CLINIC_OR_DEPARTMENT_OTHER): Payer: Self-pay

## 2021-02-24 ENCOUNTER — Ambulatory Visit (INDEPENDENT_AMBULATORY_CARE_PROVIDER_SITE_OTHER): Payer: 59 | Admitting: Sports Medicine

## 2021-02-24 DIAGNOSIS — K7581 Nonalcoholic steatohepatitis (NASH): Secondary | ICD-10-CM

## 2021-02-24 DIAGNOSIS — Z0000 Encounter for general adult medical examination without abnormal findings: Secondary | ICD-10-CM

## 2021-02-24 MED ORDER — PHENTERMINE HCL 37.5 MG PO TABS
ORAL_TABLET | ORAL | 0 refills | Status: DC
  Filled 2021-02-24: qty 30, 30d supply, fill #0

## 2021-02-24 NOTE — Assessment & Plan Note (Signed)
We diagnosed her with steatohepatitis, and discussed aggressive weight loss, she initially did well with an exercise prescription and lost 5 pounds, she gained some weight back, we started phentermine at the last visit and she has lost another 6 pounds. Refilling phentermine, entering the second month.

## 2021-02-24 NOTE — Progress Notes (Signed)
    Procedures performed today:    None.  Independent interpretation of notes and tests performed by another provider:   None.  Brief History, Exam, Impression, and Recommendations:    Steatohepatitis We diagnosed her with steatohepatitis, and discussed aggressive weight loss, she initially did well with an exercise prescription and lost 5 pounds, she gained some weight back, we started phentermine at the last visit and she has lost another 6 pounds. Refilling phentermine, entering the second month.  Annual physical exam Anihya got her flu shot at an outside facility, she will get Shingrix at the next visit with [place].    ___________________________________________ Ihor Austin. Benjamin Stain, M.D., ABFM., CAQSM. Primary Care and Sports Medicine [facility] [facility]  Adjunct Instructor of Family Medicine  [employer] [facility] [employer]

## 2021-02-24 NOTE — Assessment & Plan Note (Signed)
[name] [name] got her flu shot at an outside facility, she will get Shingrix at the next visit with [place].

## 2021-03-15 ENCOUNTER — Other Ambulatory Visit: Payer: Self-pay

## 2021-03-15 ENCOUNTER — Ambulatory Visit: Payer: 59 | Admitting: Sports Medicine

## 2021-03-15 ENCOUNTER — Other Ambulatory Visit (HOSPITAL_BASED_OUTPATIENT_CLINIC_OR_DEPARTMENT_OTHER): Payer: Self-pay

## 2021-03-15 DIAGNOSIS — Z0000 Encounter for general adult medical examination without abnormal findings: Secondary | ICD-10-CM

## 2021-03-15 DIAGNOSIS — K7581 Nonalcoholic steatohepatitis (NASH): Secondary | ICD-10-CM | POA: Diagnosis not present

## 2021-03-15 MED ORDER — PHENTERMINE HCL 37.5 MG PO TABS
ORAL_TABLET | ORAL | 0 refills | Status: DC
Start: 2021-03-15 — End: 2021-04-19
  Filled 2021-03-15: qty 30, fill #0
  Filled 2021-03-30: qty 30, 30d supply, fill #0

## 2021-03-15 NOTE — Assessment & Plan Note (Signed)
We did diagnose this pleasant 55 year old female with steatohepatitis at a previous visit, we discussed aggressive weight loss, exercise prescription provided about 5 pounds of weight loss, phentermine was started and she lost 6 pounds the first month, its only been 2 weeks, I will go ahead and refill her phentermine, she did lose an additional pound. We are essentially entering the third month, but I would like to see her back in about 6 weeks

## 2021-03-15 NOTE — Progress Notes (Signed)
    Procedures performed today:    None.  Independent interpretation of notes and tests performed by another provider:   None.  Brief History, Exam, Impression, and Recommendations:    Annual physical exam [place] will get her Shingrix vaccine in a nurse visit on Friday.  Steatohepatitis We did diagnose this pleasant 55 year old female with steatohepatitis at a previous visit, we discussed aggressive weight loss, exercise prescription provided about 5 pounds of weight loss, phentermine was started and she lost 6 pounds the first month, its only been 2 weeks, I will go ahead and refill her phentermine, she did lose an additional pound. We are essentially entering the third month, but I would like to see her back in about 6 weeks    ___________________________________________ Ihor Austin. Benjamin Stain, M.D., ABFM., CAQSM. Primary Care and Sports Medicine [facility] [facility]  Adjunct Instructor of Family Medicine  [employer] [facility] [employer]

## 2021-03-15 NOTE — Assessment & Plan Note (Signed)
[place] will get her Shingrix vaccine in a nurse visit on Friday.

## 2021-03-16 ENCOUNTER — Other Ambulatory Visit (HOSPITAL_BASED_OUTPATIENT_CLINIC_OR_DEPARTMENT_OTHER): Payer: Self-pay

## 2021-03-16 DIAGNOSIS — Z23 Encounter for immunization: Secondary | ICD-10-CM

## 2021-03-16 MED ORDER — ZOSTER VAC RECOMB ADJUVANTED 50 MCG/0.5ML IM SUSR
0.5000 mL | Freq: Once | INTRAMUSCULAR | 0 refills | Status: AC
  Filled 2021-03-16: qty 0.5, 1d supply, fill #0

## 2021-03-17 ENCOUNTER — Other Ambulatory Visit (HOSPITAL_BASED_OUTPATIENT_CLINIC_OR_DEPARTMENT_OTHER): Payer: Self-pay

## 2021-03-19 ENCOUNTER — Other Ambulatory Visit (HOSPITAL_BASED_OUTPATIENT_CLINIC_OR_DEPARTMENT_OTHER): Payer: Self-pay

## 2021-03-19 ENCOUNTER — Other Ambulatory Visit: Payer: Self-pay | Admitting: Sports Medicine

## 2021-03-19 ENCOUNTER — Ambulatory Visit: Payer: 59

## 2021-03-19 DIAGNOSIS — J4521 Mild intermittent asthma with (acute) exacerbation: Secondary | ICD-10-CM

## 2021-03-19 MED ORDER — ALBUTEROL SULFATE HFA 108 (90 BASE) MCG/ACT IN AERS
2.0000 | INHALATION_SPRAY | Freq: Four times a day (QID) | RESPIRATORY_TRACT | 11 refills | Status: DC | PRN
  Filled 2021-03-19 – 2021-06-07 (×2): qty 36, 50d supply, fill #0
  Filled 2021-11-18 (×2): qty 36, 50d supply, fill #1
  Filled 2021-12-16: qty 13.4, 50d supply, fill #1
  Filled 2022-01-28: qty 13.4, 50d supply, fill #2

## 2021-03-29 ENCOUNTER — Other Ambulatory Visit (HOSPITAL_BASED_OUTPATIENT_CLINIC_OR_DEPARTMENT_OTHER): Payer: Self-pay

## 2021-03-30 ENCOUNTER — Other Ambulatory Visit (HOSPITAL_BASED_OUTPATIENT_CLINIC_OR_DEPARTMENT_OTHER): Payer: Self-pay

## 2021-04-19 ENCOUNTER — Other Ambulatory Visit: Payer: Self-pay

## 2021-04-19 ENCOUNTER — Ambulatory Visit: Payer: 59 | Admitting: Sports Medicine

## 2021-04-19 ENCOUNTER — Other Ambulatory Visit (HOSPITAL_BASED_OUTPATIENT_CLINIC_OR_DEPARTMENT_OTHER): Payer: Self-pay

## 2021-04-19 DIAGNOSIS — K7581 Nonalcoholic steatohepatitis (NASH): Secondary | ICD-10-CM | POA: Diagnosis not present

## 2021-04-19 MED ORDER — PHENTERMINE HCL 37.5 MG PO TABS
ORAL_TABLET | ORAL | 0 refills | Status: DC
  Filled 2021-04-19 – 2021-04-28 (×2): qty 30, 30d supply, fill #0

## 2021-04-19 NOTE — Progress Notes (Signed)
° ° °  Procedures performed today:    None.  Independent interpretation of notes and tests performed by another provider:   None.  Brief History, Exam, Impression, and Recommendations:    Steatohepatitis Diagnosis of steatohepatitis, we have started aggressive weight loss, as well as exercise, she has lost a great deal of weight, after Thanksgiving this time around she only lost 2 pounds, refilling phentermine, entering the fourth month, return in a month. After 6 months we can recheck her liver function.    ___________________________________________ Ihor Austin. Benjamin Stain, M.D., ABFM., CAQSM. Primary Care and Sports Medicine [place] [facility]  Adjunct Instructor of Family Medicine  [employer] [facility] [employer]

## 2021-04-19 NOTE — Assessment & Plan Note (Signed)
Diagnosis of steatohepatitis, we have started aggressive weight loss, as well as exercise, she has lost a great deal of weight, after Thanksgiving this time around she only lost 2 pounds, refilling phentermine, entering the fourth month, return in a month. After 6 months we can recheck her liver function.

## 2021-04-20 ENCOUNTER — Other Ambulatory Visit: Payer: Self-pay | Admitting: Sports Medicine

## 2021-04-21 ENCOUNTER — Other Ambulatory Visit (HOSPITAL_BASED_OUTPATIENT_CLINIC_OR_DEPARTMENT_OTHER): Payer: Self-pay

## 2021-04-21 MED ORDER — IRBESARTAN 150 MG PO TABS
ORAL_TABLET | Freq: Every day | ORAL | 2 refills | Status: DC
  Filled 2021-04-21: qty 30, 30d supply, fill #0
  Filled 2021-05-26: qty 30, 30d supply, fill #1
  Filled 2021-07-02: qty 30, 30d supply, fill #2

## 2021-04-22 ENCOUNTER — Other Ambulatory Visit (HOSPITAL_BASED_OUTPATIENT_CLINIC_OR_DEPARTMENT_OTHER): Payer: Self-pay

## 2021-04-28 ENCOUNTER — Other Ambulatory Visit (HOSPITAL_BASED_OUTPATIENT_CLINIC_OR_DEPARTMENT_OTHER): Payer: Self-pay

## 2021-05-18 ENCOUNTER — Other Ambulatory Visit: Payer: Self-pay | Admitting: Sports Medicine

## 2021-05-18 ENCOUNTER — Other Ambulatory Visit (HOSPITAL_BASED_OUTPATIENT_CLINIC_OR_DEPARTMENT_OTHER): Payer: Self-pay

## 2021-05-18 ENCOUNTER — Ambulatory Visit: Payer: 59 | Admitting: Sports Medicine

## 2021-05-18 DIAGNOSIS — F4321 Adjustment disorder with depressed mood: Secondary | ICD-10-CM

## 2021-05-18 MED ORDER — QUETIAPINE FUMARATE 50 MG PO TABS
ORAL_TABLET | Freq: Every day | ORAL | 3 refills | Status: DC
  Filled 2021-05-18: qty 30, 30d supply, fill #0
  Filled 2021-06-20: qty 30, 30d supply, fill #1
  Filled 2021-07-26: qty 30, 30d supply, fill #2
  Filled 2021-08-25: qty 30, 30d supply, fill #3

## 2021-05-20 ENCOUNTER — Ambulatory Visit (HOSPITAL_COMMUNITY)
Admission: RE | Admit: 2021-05-20 | Discharge: 2021-05-20 | Disposition: A | Discharge status: Home / Self Care | Payer: 59 | Source: Ambulatory Visit | Attending: Sports Medicine | Admitting: Sports Medicine

## 2021-05-20 ENCOUNTER — Ambulatory Visit: Payer: 59 | Admitting: Sports Medicine

## 2021-05-20 ENCOUNTER — Encounter: Payer: Self-pay | Admitting: Sports Medicine

## 2021-05-20 ENCOUNTER — Other Ambulatory Visit: Payer: Self-pay

## 2021-05-20 ENCOUNTER — Other Ambulatory Visit (HOSPITAL_BASED_OUTPATIENT_CLINIC_OR_DEPARTMENT_OTHER): Payer: Self-pay

## 2021-05-20 DIAGNOSIS — M2241 Chondromalacia patellae, right knee: Secondary | ICD-10-CM | POA: Diagnosis present

## 2021-05-20 DIAGNOSIS — M1712 Unilateral primary osteoarthritis, left knee: Secondary | ICD-10-CM | POA: Diagnosis not present

## 2021-05-20 DIAGNOSIS — M25561 Pain in right knee: Secondary | ICD-10-CM | POA: Diagnosis not present

## 2021-05-20 DIAGNOSIS — K7581 Nonalcoholic steatohepatitis (NASH): Secondary | ICD-10-CM | POA: Diagnosis not present

## 2021-05-20 DIAGNOSIS — M1711 Unilateral primary osteoarthritis, right knee: Secondary | ICD-10-CM | POA: Diagnosis not present

## 2021-05-20 MED ORDER — TOPIRAMATE 50 MG PO TABS
ORAL_TABLET | ORAL | 0 refills | Status: DC
  Filled 2021-05-20: qty 60, 39d supply, fill #0

## 2021-05-20 MED ORDER — DICLOFENAC SODIUM 1 % EX GEL
4.0000 g | Freq: Four times a day (QID) | CUTANEOUS | 11 refills | Status: DC
  Filled 2021-05-20: qty 100, 7d supply, fill #0
  Filled 2022-01-28: qty 100, 7d supply, fill #1
  Filled 2022-04-19: qty 100, 7d supply, fill #2

## 2021-05-20 MED ORDER — PHENTERMINE HCL 37.5 MG PO TABS
ORAL_TABLET | ORAL | 0 refills | Status: DC
  Filled 2021-05-20: qty 30, fill #0
  Filled 2021-06-07: qty 30, 30d supply, fill #0

## 2021-05-20 NOTE — Progress Notes (Signed)
° ° °  Procedures performed today:    None.  Independent interpretation of notes and tests performed by another provider:   None.  Brief History, Exam, Impression, and Recommendations:    Chondromalacia of patellofemoral joint, right Pleasant 56 year old female, increasing right knee pain localized directly over the kneecap as well as the knee, worse with palpation over the top of the kneecap but also with sitting, squatting, going up and down stairs. Positive patellar compression, positive crepitus, adding reaction knee brace, topical Voltaren, home conditioning, x-rays, return to see me in 4 weeks, injection if not better.  Steatohepatitis Good initial weight loss, she has plateaued over the past couple of months with only 2 pounds each time. Adding topiramate to boost weight loss, refilling phentermine, entering the fifth month. We will recheck her LFTs at the 63-month point.    ___________________________________________ Gwen Her. Dianah Field, M.D., ABFM., CAQSM. Primary Care and [place] Instructor of [place] [employer] [facility] of Medicine

## 2021-05-20 NOTE — Assessment & Plan Note (Signed)
Pleasant 56 year old female, increasing right knee pain localized directly over the kneecap as well as the knee, worse with palpation over the top of the kneecap but also with sitting, squatting, going up and down stairs. Positive patellar compression, positive crepitus, adding reaction knee brace, topical Voltaren, home conditioning, x-rays, return to see me in 4 weeks, injection if not better.

## 2021-05-20 NOTE — Assessment & Plan Note (Signed)
Good initial weight loss, she has plateaued over the past couple of months with only 2 pounds each time. Adding topiramate to boost weight loss, refilling phentermine, entering the fifth month. We will recheck her LFTs at the 66-month point.

## 2021-05-27 ENCOUNTER — Other Ambulatory Visit (HOSPITAL_BASED_OUTPATIENT_CLINIC_OR_DEPARTMENT_OTHER): Payer: Self-pay

## 2021-06-07 ENCOUNTER — Encounter: Payer: Self-pay | Admitting: Sports Medicine

## 2021-06-07 ENCOUNTER — Other Ambulatory Visit (HOSPITAL_BASED_OUTPATIENT_CLINIC_OR_DEPARTMENT_OTHER): Payer: Self-pay

## 2021-06-07 DIAGNOSIS — K7581 Nonalcoholic steatohepatitis (NASH): Secondary | ICD-10-CM

## 2021-06-07 NOTE — Assessment & Plan Note (Signed)
Update from 05/20/2021, initial good weight loss that plateaued over a couple of months with about 2 pounds each time, we initially added Topamax, refilled phentermine as we entered the third month. She messaged me back early February, discontinuing her topiramate due to intolerability. We still want to check her LFTs at the 65-month point.

## 2021-06-17 ENCOUNTER — Other Ambulatory Visit (HOSPITAL_BASED_OUTPATIENT_CLINIC_OR_DEPARTMENT_OTHER): Payer: Self-pay

## 2021-06-17 ENCOUNTER — Other Ambulatory Visit: Payer: Self-pay

## 2021-06-17 ENCOUNTER — Ambulatory Visit: Payer: 59 | Admitting: Sports Medicine

## 2021-06-17 DIAGNOSIS — K7581 Nonalcoholic steatohepatitis (NASH): Secondary | ICD-10-CM

## 2021-06-17 DIAGNOSIS — M2241 Chondromalacia patellae, right knee: Secondary | ICD-10-CM

## 2021-06-17 MED ORDER — PHENTERMINE HCL 37.5 MG PO TABS
ORAL_TABLET | ORAL | 0 refills | Status: DC
  Filled 2021-06-17: qty 30, 30d supply, fill #0
  Filled 2021-07-02: qty 30, fill #0
  Filled 2021-07-07: qty 30, 30d supply, fill #0

## 2021-06-17 NOTE — Assessment & Plan Note (Addendum)
Entering the sixth month, continue phentermine, she lost an additional 4 to 5 pounds. Was not able to tolerate Topamax. I will see her back in 1 month on a Friday morning, and we can recheck her liver function, lipids and weight, she wants the Shingrix vaccine at that time. At that point we will probably taper down phentermine to one half tab daily.

## 2021-06-17 NOTE — Progress Notes (Signed)
° ° °  Procedures performed today:    None.  Independent interpretation of notes and tests performed by another provider:   None.  Brief History, Exam, Impression, and Recommendations:    Chondromalacia of patellofemoral joint, right Knee has improved with conservative treatment.  Steatohepatitis Entering the sixth month, continue phentermine, she lost an additional 4 to 5 pounds. Was not able to tolerate Topamax. I will see her back in 1 month on a Friday morning, and we can recheck her liver function, lipids and weight, she wants the Shingrix vaccine at that time. At that point we will probably taper down phentermine to one half tab daily.    ___________________________________________ Gwen Her. Dianah Field, M.D., ABFM., CAQSM. Primary Care and [place] Instructor of [place] of [facility] of Medicine

## 2021-06-17 NOTE — Assessment & Plan Note (Signed)
Knee has improved with conservative treatment.

## 2021-06-18 ENCOUNTER — Other Ambulatory Visit (HOSPITAL_BASED_OUTPATIENT_CLINIC_OR_DEPARTMENT_OTHER): Payer: Self-pay

## 2021-06-18 ENCOUNTER — Telehealth: Payer: Self-pay

## 2021-06-18 DIAGNOSIS — M51369 Other intervertebral disc degeneration, lumbar region without mention of lumbar back pain or lower extremity pain: Secondary | ICD-10-CM | POA: Insufficient documentation

## 2021-06-18 DIAGNOSIS — M5136 Other intervertebral disc degeneration, lumbar region: Secondary | ICD-10-CM

## 2021-06-18 MED ORDER — PREDNISONE 50 MG PO TABS
ORAL_TABLET | ORAL | 0 refills | Status: DC
  Filled 2021-06-18: qty 5, 5d supply, fill #0

## 2021-06-18 NOTE — Telephone Encounter (Signed)
Well that is just the worst luck, adding a course of prednisone.  I should probably see her sometime end of next week if not doing better.
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
## 2021-06-18 NOTE — Assessment & Plan Note (Signed)
Flaccid after appointment yesterday, radicular symptoms, adding prednisone, she will see me next week.

## 2021-06-21 ENCOUNTER — Other Ambulatory Visit (HOSPITAL_BASED_OUTPATIENT_CLINIC_OR_DEPARTMENT_OTHER): Payer: Self-pay

## 2021-06-29 ENCOUNTER — Other Ambulatory Visit (HOSPITAL_BASED_OUTPATIENT_CLINIC_OR_DEPARTMENT_OTHER): Payer: Self-pay

## 2021-06-29 ENCOUNTER — Ambulatory Visit (INDEPENDENT_AMBULATORY_CARE_PROVIDER_SITE_OTHER): Payer: 59 | Admitting: Sports Medicine

## 2021-06-29 ENCOUNTER — Ambulatory Visit (INDEPENDENT_AMBULATORY_CARE_PROVIDER_SITE_OTHER): Payer: 59

## 2021-06-29 ENCOUNTER — Other Ambulatory Visit: Payer: Self-pay

## 2021-06-29 DIAGNOSIS — S3993XA Unspecified injury of pelvis, initial encounter: Secondary | ICD-10-CM | POA: Diagnosis not present

## 2021-06-29 DIAGNOSIS — M545 Low back pain, unspecified: Secondary | ICD-10-CM | POA: Diagnosis not present

## 2021-06-29 DIAGNOSIS — M533 Sacrococcygeal disorders, not elsewhere classified: Secondary | ICD-10-CM | POA: Diagnosis not present

## 2021-06-29 DIAGNOSIS — M7061 Trochanteric bursitis, right hip: Secondary | ICD-10-CM

## 2021-06-29 DIAGNOSIS — M25551 Pain in right hip: Secondary | ICD-10-CM | POA: Diagnosis not present

## 2021-06-29 DIAGNOSIS — R102 Pelvic and perineal pain: Secondary | ICD-10-CM | POA: Diagnosis not present

## 2021-06-29 MED ORDER — TRAMADOL HCL 50 MG PO TABS
50.0000 mg | ORAL_TABLET | Freq: Three times a day (TID) | ORAL | 0 refills | Status: DC | PRN
  Filled 2021-06-29: qty 21, 7d supply, fill #0
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
## 2021-07-02 ENCOUNTER — Other Ambulatory Visit (HOSPITAL_BASED_OUTPATIENT_CLINIC_OR_DEPARTMENT_OTHER): Payer: Self-pay

## 2021-07-02 ENCOUNTER — Encounter: Payer: Self-pay | Admitting: Sports Medicine

## 2021-07-07 ENCOUNTER — Other Ambulatory Visit (HOSPITAL_BASED_OUTPATIENT_CLINIC_OR_DEPARTMENT_OTHER): Payer: Self-pay

## 2021-07-16 ENCOUNTER — Ambulatory Visit (INDEPENDENT_AMBULATORY_CARE_PROVIDER_SITE_OTHER): Payer: 59

## 2021-07-16 ENCOUNTER — Other Ambulatory Visit: Payer: Self-pay

## 2021-07-16 ENCOUNTER — Other Ambulatory Visit (HOSPITAL_BASED_OUTPATIENT_CLINIC_OR_DEPARTMENT_OTHER): Payer: Self-pay

## 2021-07-16 ENCOUNTER — Ambulatory Visit: Payer: 59 | Admitting: Sports Medicine

## 2021-07-16 VITALS — Wt 138.6 lb

## 2021-07-16 DIAGNOSIS — M25551 Pain in right hip: Secondary | ICD-10-CM

## 2021-07-16 DIAGNOSIS — Z23 Encounter for immunization: Secondary | ICD-10-CM

## 2021-07-16 DIAGNOSIS — K7581 Nonalcoholic steatohepatitis (NASH): Secondary | ICD-10-CM

## 2021-07-16 DIAGNOSIS — M5459 Other low back pain: Secondary | ICD-10-CM | POA: Diagnosis not present

## 2021-07-16 DIAGNOSIS — M545 Low back pain, unspecified: Secondary | ICD-10-CM | POA: Diagnosis not present

## 2021-07-16 DIAGNOSIS — E782 Mixed hyperlipidemia: Secondary | ICD-10-CM | POA: Diagnosis not present

## 2021-07-16 MED ORDER — PHENTERMINE HCL 37.5 MG PO TABS
ORAL_TABLET | ORAL | 0 refills | Status: DC
  Filled 2021-07-16: qty 45, fill #0
  Filled 2021-08-16: qty 45, 90d supply, fill #0

## 2021-07-16 NOTE — Progress Notes (Addendum)
? ? ?  Procedures performed today:   ? ?None. ? ?Independent interpretation of notes and tests performed by another provider:  ? ?None. ? ?Brief History, Exam, Impression, and Recommendations:   ? ?Steatohepatitis ?We have now finished 6 months of phentermine, dropping to one half tab daily. ?Was not able to tolerate Topamax. ?We will go ahead and recheck her liver function, lipids. ?We can also do Shingrix today ? ?Right hip pain ?[place] continues to have pain in her right hip/low back, see prior notes, x-rays unrevealing, due to severe pain over the right sacrum we will proceed with pelvic CT without contrast. ? ?For insurance purposes she has failed greater than 6 weeks of conservative treatment, x-rays with potential sacrococcygeal fracture. ? ?Update: Persistent pain hip posteriorly, lateral sacrum, CT negative, adding formal physical therapy at [facility].  If this fails we can consider a sacroiliac joint injection. ? ?I spent 30 minutes of total time managing this patient today, this includes chart review, face to face, and non-face to face time. ? ?___________________________________________ ?Gwen Her. Dianah Field, M.D., ABFM., CAQSM. ?Primary Care and Sports Medicine ?[place] ? ?Adjunct Instructor of Family Medicine  ?[employer] ?

## 2021-07-16 NOTE — Addendum Note (Signed)
Addended by: Carolin Coy on: 07/16/2021 09:18 AM ? ? Modules accepted: Orders ? ?

## 2021-07-16 NOTE — Assessment & Plan Note (Signed)
We have now finished 6 months of phentermine, dropping to one half tab daily. ?Was not able to tolerate Topamax. ?We will go ahead and recheck her liver function, lipids. ?We can also do Shingrix today ?

## 2021-07-16 NOTE — Assessment & Plan Note (Addendum)
[place] continues to have pain in her right hip/low back, see prior notes, x-rays unrevealing, due to severe pain over the right sacrum we will proceed with pelvic CT without contrast. ? ?For insurance purposes she has failed greater than 6 weeks of conservative treatment, x-rays with potential sacrococcygeal fracture. ? ?Update: Persistent pain hip posteriorly, lateral sacrum, CT negative, adding formal physical therapy at [facility].  If this fails we can consider a sacroiliac joint injection. ?

## 2021-07-17 LAB — COMPREHENSIVE METABOLIC PANEL
AG Ratio: 2.1 (calc) (ref 1.0–2.5)
ALT: 23 U/L (ref 6–29)
AST: 20 U/L (ref 10–35)
Albumin: 4.5 g/dL (ref 3.6–5.1)
Alkaline phosphatase (APISO): 112 U/L (ref 37–153)
BUN: 15 mg/dL (ref 7–25)
CO2: 28 mmol/L (ref 20–32)
Calcium: 9.4 mg/dL (ref 8.6–10.4)
Chloride: 104 mmol/L (ref 98–110)
Creat: 0.89 mg/dL (ref 0.50–1.03)
Globulin: 2.1 g/dL (calc) (ref 1.9–3.7)
Glucose, Bld: 98 mg/dL (ref 65–99)
Potassium: 4.3 mmol/L (ref 3.5–5.3)
Sodium: 140 mmol/L (ref 135–146)
Total Bilirubin: 0.7 mg/dL (ref 0.2–1.2)
Total Protein: 6.6 g/dL (ref 6.1–8.1)

## 2021-07-17 LAB — LIPID PANEL
Cholesterol: 137 mg/dL (ref <200)
HDL: 46 mg/dL — ABNORMAL LOW (ref >=50)
LDL Cholesterol (Calc): 74 mg/dL (calc)
Non-HDL Cholesterol (Calc): 91 mg/dL (calc) (ref <130)
Total CHOL/HDL Ratio: 3 (calc) (ref <5.0)
Triglycerides: 86 mg/dL (ref <150)

## 2021-07-18 ENCOUNTER — Encounter: Payer: Self-pay | Admitting: Sports Medicine

## 2021-07-20 ENCOUNTER — Other Ambulatory Visit (HOSPITAL_BASED_OUTPATIENT_CLINIC_OR_DEPARTMENT_OTHER): Payer: Self-pay

## 2021-07-20 ENCOUNTER — Other Ambulatory Visit: Payer: Self-pay | Admitting: Sports Medicine

## 2021-07-20 DIAGNOSIS — M7061 Trochanteric bursitis, right hip: Secondary | ICD-10-CM

## 2021-07-20 MED ORDER — TRAMADOL HCL 50 MG PO TABS
50.0000 mg | ORAL_TABLET | Freq: Three times a day (TID) | ORAL | 0 refills | Status: DC | PRN
  Filled 2021-07-20: qty 30, 10d supply, fill #0

## 2021-07-21 NOTE — Addendum Note (Signed)
Addended by: Silverio Decamp on: 07/21/2021 02:39 PM ? ? Modules accepted: Orders ? ?

## 2021-07-23 DIAGNOSIS — Z1211 Encounter for screening for malignant neoplasm of colon: Secondary | ICD-10-CM | POA: Diagnosis not present

## 2021-07-26 ENCOUNTER — Other Ambulatory Visit (HOSPITAL_BASED_OUTPATIENT_CLINIC_OR_DEPARTMENT_OTHER): Payer: Self-pay

## 2021-07-26 ENCOUNTER — Other Ambulatory Visit: Payer: Self-pay | Admitting: Sports Medicine

## 2021-07-26 MED ORDER — IRBESARTAN 150 MG PO TABS
150.0000 mg | ORAL_TABLET | Freq: Every day | ORAL | 2 refills | Status: DC
  Filled 2021-07-26: qty 30, 30d supply, fill #0
  Filled 2021-09-13: qty 30, 30d supply, fill #1
  Filled 2021-10-22: qty 30, 30d supply, fill #2

## 2021-07-31 LAB — COLOGUARD: COLOGUARD: NEGATIVE

## 2021-08-02 ENCOUNTER — Other Ambulatory Visit (HOSPITAL_BASED_OUTPATIENT_CLINIC_OR_DEPARTMENT_OTHER): Payer: Self-pay

## 2021-08-02 ENCOUNTER — Telehealth: Payer: 59 | Admitting: Family Medicine

## 2021-08-02 ENCOUNTER — Ambulatory Visit: Payer: 59 | Attending: Sports Medicine

## 2021-08-02 DIAGNOSIS — B9689 Other specified bacterial agents as the cause of diseases classified elsewhere: Secondary | ICD-10-CM | POA: Diagnosis not present

## 2021-08-02 DIAGNOSIS — M6281 Muscle weakness (generalized): Secondary | ICD-10-CM | POA: Insufficient documentation

## 2021-08-02 DIAGNOSIS — M25551 Pain in right hip: Secondary | ICD-10-CM | POA: Diagnosis present

## 2021-08-02 DIAGNOSIS — J019 Acute sinusitis, unspecified: Secondary | ICD-10-CM

## 2021-08-02 MED ORDER — AMOXICILLIN-POT CLAVULANATE 875-125 MG PO TABS
1.0000 | ORAL_TABLET | Freq: Two times a day (BID) | ORAL | 0 refills | Status: AC
  Filled 2021-08-02: qty 14, 7d supply, fill #0

## 2021-08-02 MED ORDER — PROMETHAZINE-DM 6.25-15 MG/5ML PO SYRP
2.5000 mL | ORAL_SOLUTION | Freq: Four times a day (QID) | ORAL | 0 refills | Status: DC | PRN
  Filled 2021-08-02: qty 118, 12d supply, fill #0

## 2021-08-02 MED ORDER — BENZONATATE 100 MG PO CAPS
100.0000 mg | ORAL_CAPSULE | Freq: Two times a day (BID) | ORAL | 0 refills | Status: DC | PRN
  Filled 2021-08-02: qty 20, 10d supply, fill #0

## 2021-08-02 NOTE — Progress Notes (Signed)
E-Visit for Sinus Problems ? ?We are sorry that you are not feeling well.  Here is how we plan to help! ? ?Based on what you have shared with me it looks like you have sinusitis.   ?With on going headache, congestion and cough. Most likley was viral that is now creating bacterial growth.  ? ? ?Sinusitis is inflammation and infection in the sinus cavities of the head.  Based on your presentation I believe you most likely have Acute Bacterial Sinusitis.  This is an infection caused by bacteria and is treated with antibiotics. I have prescribed Augmentin 875mg /125mg  one tablet twice daily with food, for 7 days. ?I will also order tessalon perles and promethazine DM for cough.  ? You may use an oral decongestant such as Mucinex D or if you have glaucoma or high blood pressure use plain Mucinex. Saline nasal spray help and can safely be used as often as needed for congestion.  If you develop worsening sinus pain, fever or notice severe headache and vision changes, or if symptoms are not better after completion of antibiotic, please schedule an appointment with a health care provider.   ? ?Sinus infections are not as easily transmitted as other respiratory infection, however we still recommend that you avoid close contact with loved ones, especially the very young and elderly.  Remember to wash your hands thoroughly throughout the day as this is the number one way to prevent the spread of infection! ? ?Home Care: ?Only take medications as instructed by your medical team. ?Complete the entire course of an antibiotic. ?Do not take these medications with alcohol. ?A steam or ultrasonic humidifier can help congestion.  You can place a towel over your head and breathe in the steam from hot water coming from a faucet. ?Avoid close contacts especially the very young and the elderly. ?Cover your mouth when you cough or sneeze. ?Always remember to wash your hands. ? ?Get Help Right Away If: ?You develop worsening fever or sinus  pain. ?You develop a severe head ache or visual changes. ?Your symptoms persist after you have completed your treatment plan. ? ?Make sure you ?Understand these instructions. ?Will watch your condition. ?Will get help right away if you are not doing well or get worse. ? ?Thank you for choosing an e-visit. ? ?Your e-visit answers were reviewed by a board certified advanced clinical practitioner to complete your personal care plan. Depending upon the condition, your plan could have included both over the counter or prescription medications. ? ?Please review your pharmacy choice. Make sure the pharmacy is open so you can pick up prescription now. If there is a problem, you may contact your provider through and have the prescription routed to another pharmacy.  Your safety is important to [employer]. If you have drug allergies check your prescription carefully.  ? ?For the next 24 hours you can use MyChart to ask questions about today's visit, request a non-urgent call back, or ask for a work or school excuse. ?You will get an email in the next two days asking about your experience. I hope that your e-visit has been valuable and will speed your recovery. ? ? ? ?I provided 5 minutes of non face-to-face time during this encounter for chart review, medication and order placement, as well as and documentation.  ? ?
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
## 2021-08-04 ENCOUNTER — Encounter: Payer: Self-pay | Admitting: Sports Medicine

## 2021-08-10 ENCOUNTER — Ambulatory Visit: Payer: 59

## 2021-08-10 DIAGNOSIS — M25551 Pain in right hip: Secondary | ICD-10-CM

## 2021-08-10 DIAGNOSIS — M6281 Muscle weakness (generalized): Secondary | ICD-10-CM | POA: Diagnosis not present

## 2021-08-10 NOTE — Therapy (Signed)
?OUTPATIENT PHYSICAL THERAPY TREATMENT NOTE ? ? ?Patient Name: [name] [name] ?MRN: [id number] ?DOB:[birth year], 56 y.o., female ?Today's Date: 08/10/2021 ? ?PCP: Silverio Decamp, MD ?REFERRING PROVIDER: Silverio Decamp,* ? ?END OF SESSION:  ? PT End of Session - 08/10/21 1752   ? ? Visit Number 2   ? Number of Visits 8   ? Date for PT Re-Evaluation 08/30/21   ? Authorization Type UMR   ? PT Start Time 1750   ? PT Stop Time [id number]   ? PT Time Calculation (min) 40 min   ? ?  ?  ? ?  ? ? ?Past Medical History:  ?Diagnosis Date  ? Asthma   ? Essential hypertension 01/14/2015  ? GERD (gastroesophageal reflux disease)   ? Hydronephrosis, right   ? chronic  ? IBS (irritable bowel syndrome)   ? Incarcerated epigastric hernia 01/10/2017  ? Murmur   ? ?Past Surgical History:  ?Procedure Laterality Date  ? HAND SURGERY    ? LT 2002  ? INSERTION OF MESH N/A 01/10/2017  ? Procedure: INSERTION OF MESH;  Surgeon: Fanny Skates, MD;  Location: [place];  Service: General;  Laterality: N/A;  ? PYELOPLASTY    ? RT kidney 1993  ? pyleoplasty    ? right kidney  ? TONSILLECTOMY    ? VENTRAL HERNIA REPAIR N/A 01/10/2017  ? Procedure: LAPAROSCOPIC REPAIR EPIGASTRIC HERNIA WITH MESH;  Surgeon: Fanny Skates, MD;  Location: [place];  Service: General;  Laterality: N/A;  ? ?Patient Active Problem List  ? Diagnosis Date Noted  ? Lumbar degenerative disc disease 06/18/2021  ? 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  ? Hyperlipidemia 12/16/2020  ? Cardiomegaly noted on CT 12/10/2020  ? Steatohepatitis 12/10/2020  ? Bee sting, left eyelid 02/18/2020  ? Tick bite 09/11/2019  ? Polyarthralgia 07/04/2019  ? Skin rash 05/16/2019  ? Grieving 10/18/2018  ? Umbilical hernia [id number]  ? Annual physical exam 11/03/2016  ? Essential hypertension 01/14/2015  ? Dysfunctional uterine bleeding 05/27/2013  ? Right hip pain 06/25/2012  ? Right third Metacarpophalangeal joint pain 02/02/2012  ? Asthma  06/23/2009  ? GERD 06/23/2009  ? IBS 06/23/2009  ? ? ?REFERRING DIAG: M25.551 (ICD-10-CM) - Right hip pain  ? ?THERAPY DIAG:  ?R hip/SI pain ? ?PERTINENT HISTORY: Right hip pain ?Sliger continues to have pain in her right hip/low back, see prior notes, x-rays unrevealing, due to severe pain over the right sacrum we will proceed with pelvic CT without contrast. ?  ?For insurance purposes she has failed greater than 6 weeks of conservative treatment, x-rays with potential sacrococcygeal fracture. ?  ?Update: Persistent pain hip posteriorly, lateral sacrum, CT negative, adding formal physical therapy at [facility].  If this fails we can consider a sacroiliac joint injection. ?  ? ?PRECAUTIONS: None ? ?SUBJECTIVE: Pain has been less intense overall and takes more stress to aggravat it.  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 ? ?PAIN:  ?Are you having pain?  4/10 R piriformis ? ? ? ? ? ?OBJECTIVE:  ?  ?DIAGNOSTIC FINDINGS: FINDINGS: ?No recent fracture is seen. There is minimal anterior subluxation of ?second segment of coccyx in relation to the first segment. No ?fracture lines are seen. Phleboliths are seen in the pelvis. ?  ?IMPRESSION: ?No recent fracture is seen. There is mild anterior subluxation of ?second segment of coccyx in relation to the first segment. This may ?suggest recent or old subluxation. ?  ?  ?  Electronically Signed ?  By: Elmer Picker M.D. ?  On: 07/01/2021 15:12 ?  ?Additional images of the lower sacrum and coccyx were obtained. ?There is no acute fracture. Chronic deformity of the distal coccyx ?with posterior orientation of the C4 segment, likely post-traumatic. ?The sacroiliac joints are unremarkable. ?  ?  ?Electronically Signed ?  By: Titus Dubin M.D. ?  On: 07/21/2021 08:54 ?  ?PATIENT SURVEYS:  ?FOTO 75 ?  ?COGNITION: ?          Overall cognitive status: Within functional limits for tasks assessed               ?           ?SENSATION: ?Not tested ?  ?MUSCLE  LENGTH: ?Hamstrings: Right 80 deg; Left 80 deg ?Thomas test: Right positive for pain ?  ?POSTURE:  ?WFL ?  ?PALPATION: ?TTP R piriformis ?  ?LE ROM: ?  ?Passive ROM Right ?08/02/2021 Left ?08/02/2021  ?Hip flexion 130 130  ?Hip extension WNL WNL  ?Hip abduction      ?Hip adduction      ?Hip internal rotation      ?Hip external rotation      ?Knee flexion WNL WNL  ?Knee extension WNL WNL  ?Ankle dorsiflexion WNL WNL  ?Ankle plantarflexion WNL WNL  ?Ankle inversion      ?Ankle eversion      ? (Blank rows = not tested) ?  ?LE MMT: ?  ?MMT Right ?08/02/2021 Left ?08/02/2021  ?Hip flexion 4* 5  ?Hip extension 4* 5  ?Hip abduction 4* 5  ?Hip adduction      ?Hip internal rotation      ?Hip external rotation      ?Knee flexion 5 5  ?Knee extension 5 5  ?Ankle dorsiflexion      ?Ankle plantarflexion      ?Ankle inversion      ?Ankle eversion      ? (* pain with testing) ?  ?LOWER EXTREMITY SPECIAL TESTS:  ?Hip special tests: Saralyn Pilar (FABER) test: positive , Thomas test: negative, and Piriformis test: positive  ?  ?FUNCTIONAL TESTS:  ?5 times sit to stand: TBD ?  ?GAIT: ?Distance walked: 53ft x2 ?Assistive device utilized: None ?Level of assistance: Complete Independence ?Comments: slightly antalgic ?  ?  ?  ?TODAY'S TREATMENT: ?[facility] Adult PT Treatment:                                                DATE: 08/10/21 ?Therapeutic Exercise: ?Nustep L2 8 min ?Piriformis stretch fig 4 30s x3 ?Clamshell 15x2 ?SL R hip abduction in slight extension 15x2 ?Hip flexor ?SLR R 15x ?Manual Therapy: ?R piriformis release 8 min ? ?  ?  ?PATIENT EDUCATION:  ?Education details: Discussed eval findings, rehab rationale and POC and patient is in agreement  ?Person educated: Patient ?Education method: Explanation, Verbal cues, and Handouts ?Education comprehension: verbalized understanding, returned demonstration, and needs further education ?  ?  ?HOME EXERCISE PROGRAM: ?Access Code: [email or web address] ?URL: [email or web address] ?Date:  08/10/2021 ?Prepared by: Sharlynn Oliphant ? ?Exercises ?- Supine Piriformis Stretch with Foot on Ground  - 2 x daily - 7 x weekly - 1 sets - 3 reps - 30s hold ?- Clamshell  - 2 x daily - 7 x weekly - 2 sets - 15 reps ?- Hip Flexor Stretch  at [facility] of Bed  - 2 x daily - 7 x weekly - 1 sets - 3 reps - 30s hold ?- Supine Sciatic Nerve Glide  - 3 x daily - 7 x weekly - 1 sets - 10 repsAccess Code: [email or web address] ? ?ASSESSMENT: ?  ?CLINICAL IMPRESSION: Pain has been less and notes it takes increased stress to aggravate symptoms. Added piriformis release as well as additional strengthening tasks to R hip. R hip abductors fatigue easily, still noting flexibility deficits in R piriformis.  Added sciatic flossing to HEP ? ?  ?  ?OBJECTIVE IMPAIRMENTS Abnormal gait, decreased mobility, difficulty walking, increased muscle spasms, postural dysfunction, and pain.  ?  ?  ?PERSONAL FACTORS Time since onset of injury/illness/exacerbation are also affecting patient's functional outcome.  ?  ?  ?REHAB POTENTIAL: Good ?  ?CLINICAL DECISION MAKING: Stable/uncomplicated ?  ?EVALUATION COMPLEXITY: Low ?  ?  ?GOALS: ?Goals reviewed with patient? Yes ?  ?SHORT TERM GOALS: Target date: 08/16/2021 ?  ?Patient to demonstrate independence in HEP  ?Baseline:TWVWTPQ4 ?Goal status: INITIAL ?  ?2.  Assess 5x STS test and establish goal ?Baseline: TBD ?Goal status: INITIAL ?  ?3.  Decrease pain to 6/10 ?Baseline: 8/10 ?Goal status: INITIAL ?  ?  ?  ?LONG TERM GOALS: Target date: 08/30/2021 ?  ?Increase FOTO score to 70 ?Baseline: 49 ?Goal status: INITIAL ?  ?2.  4+/5 R hip F/E/ABD strength ?Baseline: 4/5 due to pain ?Goal status: INITIAL ?  ?3.  2/10 pain ?Baseline: 8/10 worst pain ?Goal status: INITIAL ?  ?4.  Negative FABER sign ?Baseline:  ?Goal status: INITIAL ?  ?5.  Assess 5x STS progress ?Baseline: TBD ?Goal status: INITIAL ?  ?  ?  ?  ?PLAN: ?PT FREQUENCY: 2x/week ?  ?PT DURATION: 4 weeks ?  ?PLANNED INTERVENTIONS: Therapeutic exercises, Therapeutic  activity, Neuromuscular re-education, Balance training, Gait training, Patient/Family education, Joint mobilization, Dry Needling, and Manual therapy ?  ?PLAN FOR NEXT SESSION: HEP review, hip/core strength and flexibility t

## 2021-08-12 ENCOUNTER — Ambulatory Visit: Payer: 59

## 2021-08-12 DIAGNOSIS — M6281 Muscle weakness (generalized): Secondary | ICD-10-CM | POA: Diagnosis not present

## 2021-08-12 DIAGNOSIS — M25551 Pain in right hip: Secondary | ICD-10-CM
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
## 2021-08-16 ENCOUNTER — Other Ambulatory Visit (HOSPITAL_BASED_OUTPATIENT_CLINIC_OR_DEPARTMENT_OTHER): Payer: Self-pay

## 2021-08-20 ENCOUNTER — Ambulatory Visit: Payer: 59 | Admitting: Sports Medicine

## 2021-08-20 ENCOUNTER — Ambulatory Visit (INDEPENDENT_AMBULATORY_CARE_PROVIDER_SITE_OTHER): Payer: 59

## 2021-08-20 DIAGNOSIS — M25551 Pain in right hip: Secondary | ICD-10-CM
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
## 2021-08-25 ENCOUNTER — Other Ambulatory Visit (HOSPITAL_BASED_OUTPATIENT_CLINIC_OR_DEPARTMENT_OTHER): Payer: Self-pay

## 2021-09-13 ENCOUNTER — Other Ambulatory Visit (HOSPITAL_BASED_OUTPATIENT_CLINIC_OR_DEPARTMENT_OTHER): Payer: Self-pay

## 2021-09-13 ENCOUNTER — Other Ambulatory Visit: Payer: Self-pay | Admitting: Sports Medicine

## 2021-09-13 DIAGNOSIS — F4321 Adjustment disorder with depressed mood: Secondary | ICD-10-CM

## 2021-09-14 ENCOUNTER — Other Ambulatory Visit (HOSPITAL_BASED_OUTPATIENT_CLINIC_OR_DEPARTMENT_OTHER): Payer: Self-pay

## 2021-09-14 MED ORDER — QUETIAPINE FUMARATE 50 MG PO TABS
ORAL_TABLET | Freq: Every day | ORAL | 3 refills | Status: DC
  Filled 2021-09-14 (×2): qty 30, 30d supply, fill #0
  Filled 2021-10-22: qty 30, 30d supply, fill #1
  Filled 2021-11-18: qty 30, 30d supply, fill #2
  Filled 2021-12-16: qty 30, 30d supply, fill #3

## 2021-09-28 ENCOUNTER — Ambulatory Visit (INDEPENDENT_AMBULATORY_CARE_PROVIDER_SITE_OTHER): Payer: 59

## 2021-09-28 ENCOUNTER — Ambulatory Visit: Payer: 59 | Admitting: Sports Medicine

## 2021-09-28 ENCOUNTER — Other Ambulatory Visit (HOSPITAL_COMMUNITY): Payer: Self-pay

## 2021-09-28 DIAGNOSIS — M25551 Pain in right hip: Secondary | ICD-10-CM | POA: Diagnosis not present

## 2021-09-28 DIAGNOSIS — M7061 Trochanteric bursitis, right hip: Secondary | ICD-10-CM

## 2021-09-28 MED ORDER — TRAMADOL HCL 50 MG PO TABS
50.0000 mg | ORAL_TABLET | Freq: Three times a day (TID) | ORAL | 0 refills | Status: DC | PRN
  Filled 2021-09-28: qty 90, 30d supply, fill #0
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
## 2021-10-22 ENCOUNTER — Other Ambulatory Visit (HOSPITAL_BASED_OUTPATIENT_CLINIC_OR_DEPARTMENT_OTHER): Payer: Self-pay

## 2021-11-09 ENCOUNTER — Other Ambulatory Visit: Payer: Self-pay | Admitting: Sports Medicine

## 2021-11-09 ENCOUNTER — Ambulatory Visit: Payer: 59 | Admitting: Sports Medicine

## 2021-11-09 DIAGNOSIS — M25551 Pain in right hip: Secondary | ICD-10-CM
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
## 2021-11-10 ENCOUNTER — Other Ambulatory Visit (HOSPITAL_BASED_OUTPATIENT_CLINIC_OR_DEPARTMENT_OTHER): Payer: Self-pay

## 2021-11-10 MED ORDER — IRBESARTAN 150 MG PO TABS
150.0000 mg | ORAL_TABLET | Freq: Every day | ORAL | 2 refills | Status: DC
  Filled 2021-11-10 – 2021-11-18 (×2): qty 30, 30d supply, fill #0
  Filled 2021-12-16: qty 30, 30d supply, fill #1
  Filled 2022-01-28: qty 30, 30d supply, fill #2

## 2021-11-18 ENCOUNTER — Other Ambulatory Visit (HOSPITAL_BASED_OUTPATIENT_CLINIC_OR_DEPARTMENT_OTHER): Payer: Self-pay

## 2021-11-18 ENCOUNTER — Other Ambulatory Visit: Payer: Self-pay | Admitting: Sports Medicine

## 2021-11-18 DIAGNOSIS — M25551 Pain in right hip: Secondary | ICD-10-CM

## 2021-11-18 DIAGNOSIS — M7061 Trochanteric bursitis, right hip: Secondary | ICD-10-CM

## 2021-11-18 MED ORDER — TRAMADOL HCL 50 MG PO TABS
50.0000 mg | ORAL_TABLET | Freq: Three times a day (TID) | ORAL | 0 refills | Status: DC | PRN
  Filled 2021-11-18: qty 90, 30d supply, fill #0

## 2021-11-19 ENCOUNTER — Other Ambulatory Visit (HOSPITAL_BASED_OUTPATIENT_CLINIC_OR_DEPARTMENT_OTHER): Payer: Self-pay

## 2021-12-03 ENCOUNTER — Other Ambulatory Visit (HOSPITAL_BASED_OUTPATIENT_CLINIC_OR_DEPARTMENT_OTHER): Payer: Self-pay

## 2021-12-16 ENCOUNTER — Other Ambulatory Visit (HOSPITAL_BASED_OUTPATIENT_CLINIC_OR_DEPARTMENT_OTHER): Payer: Self-pay

## 2022-01-28 ENCOUNTER — Other Ambulatory Visit (HOSPITAL_BASED_OUTPATIENT_CLINIC_OR_DEPARTMENT_OTHER): Payer: Self-pay

## 2022-01-28 ENCOUNTER — Other Ambulatory Visit: Payer: Self-pay | Admitting: Sports Medicine

## 2022-01-28 DIAGNOSIS — F4321 Adjustment disorder with depressed mood: Secondary | ICD-10-CM

## 2022-01-28 MED ORDER — QUETIAPINE FUMARATE 50 MG PO TABS
50.0000 mg | ORAL_TABLET | Freq: Every day | ORAL | 2 refills | Status: DC
  Filled 2022-01-28: qty 30, 30d supply, fill #0
  Filled 2022-02-28 – 2022-03-04 (×2): qty 30, 30d supply, fill #1
  Filled 2022-03-31: qty 30, 30d supply, fill #2

## 2022-02-13 ENCOUNTER — Other Ambulatory Visit: Payer: Self-pay | Admitting: Sports Medicine

## 2022-02-13 DIAGNOSIS — M25551 Pain in right hip: Secondary | ICD-10-CM

## 2022-02-13 DIAGNOSIS — M7061 Trochanteric bursitis, right hip: Secondary | ICD-10-CM

## 2022-02-13 DIAGNOSIS — E782 Mixed hyperlipidemia: Secondary | ICD-10-CM

## 2022-02-14 ENCOUNTER — Other Ambulatory Visit (HOSPITAL_COMMUNITY): Payer: Self-pay

## 2022-02-14 MED ORDER — ROSUVASTATIN CALCIUM 10 MG PO TABS
10.0000 mg | ORAL_TABLET | Freq: Every day | ORAL | 3 refills | Status: DC
  Filled 2022-02-14: qty 90, 90d supply, fill #0
  Filled 2022-06-02: qty 90, 90d supply, fill #1
  Filled 2022-09-28: qty 90, 90d supply, fill #2
  Filled 2023-01-04: qty 90, 90d supply, fill #3

## 2022-02-14 MED ORDER — TRAMADOL HCL 50 MG PO TABS
50.0000 mg | ORAL_TABLET | Freq: Three times a day (TID) | ORAL | 0 refills | Status: DC | PRN
  Filled 2022-02-14 – 2022-03-01 (×2): qty 90, 30d supply, fill #0

## 2022-02-15 ENCOUNTER — Other Ambulatory Visit (HOSPITAL_COMMUNITY): Payer: Self-pay

## 2022-02-23 ENCOUNTER — Encounter (HOSPITAL_COMMUNITY): Payer: Self-pay | Admitting: Pharmacist

## 2022-02-23 ENCOUNTER — Other Ambulatory Visit (HOSPITAL_COMMUNITY): Payer: Self-pay

## 2022-02-25 ENCOUNTER — Encounter: Payer: Self-pay | Admitting: Sports Medicine

## 2022-02-28 ENCOUNTER — Other Ambulatory Visit (HOSPITAL_BASED_OUTPATIENT_CLINIC_OR_DEPARTMENT_OTHER): Payer: Self-pay

## 2022-02-28 ENCOUNTER — Other Ambulatory Visit (HOSPITAL_COMMUNITY): Payer: Self-pay

## 2022-03-01 ENCOUNTER — Other Ambulatory Visit: Payer: Self-pay | Admitting: Sports Medicine

## 2022-03-01 ENCOUNTER — Other Ambulatory Visit (HOSPITAL_COMMUNITY): Payer: Self-pay

## 2022-03-04 ENCOUNTER — Other Ambulatory Visit (HOSPITAL_COMMUNITY): Payer: Self-pay

## 2022-03-07 ENCOUNTER — Other Ambulatory Visit (HOSPITAL_BASED_OUTPATIENT_CLINIC_OR_DEPARTMENT_OTHER): Payer: Self-pay

## 2022-03-07 ENCOUNTER — Other Ambulatory Visit (HOSPITAL_COMMUNITY): Payer: Self-pay

## 2022-03-07 MED ORDER — IRBESARTAN 150 MG PO TABS
150.0000 mg | ORAL_TABLET | Freq: Every day | ORAL | 0 refills | Status: DC
  Filled 2022-03-07: qty 30, 30d supply, fill #0

## 2022-03-08 ENCOUNTER — Other Ambulatory Visit (HOSPITAL_COMMUNITY): Payer: Self-pay

## 2022-03-31 ENCOUNTER — Ambulatory Visit (INDEPENDENT_AMBULATORY_CARE_PROVIDER_SITE_OTHER): Payer: 59 | Admitting: Sports Medicine

## 2022-03-31 ENCOUNTER — Other Ambulatory Visit (HOSPITAL_BASED_OUTPATIENT_CLINIC_OR_DEPARTMENT_OTHER): Payer: Self-pay

## 2022-03-31 ENCOUNTER — Encounter: Payer: Self-pay | Admitting: Sports Medicine

## 2022-03-31 ENCOUNTER — Other Ambulatory Visit: Payer: Self-pay | Admitting: Sports Medicine

## 2022-03-31 VITALS — BP 129/81 | HR 83 | Wt 144.0 lb

## 2022-03-31 DIAGNOSIS — Z23 Encounter for immunization: Secondary | ICD-10-CM

## 2022-03-31 DIAGNOSIS — M51369 Other intervertebral disc degeneration, lumbar region without mention of lumbar back pain or lower extremity pain: Secondary | ICD-10-CM

## 2022-03-31 DIAGNOSIS — M5136 Other intervertebral disc degeneration, lumbar region: Secondary | ICD-10-CM

## 2022-03-31 DIAGNOSIS — Z1231 Encounter for screening mammogram for malignant neoplasm of breast: Secondary | ICD-10-CM | POA: Diagnosis not present

## 2022-03-31 DIAGNOSIS — Z0000 Encounter for general adult medical examination without abnormal findings: Secondary | ICD-10-CM | POA: Diagnosis not present

## 2022-03-31 DIAGNOSIS — J4521 Mild intermittent asthma with (acute) exacerbation: Secondary | ICD-10-CM

## 2022-03-31 MED ORDER — GABAPENTIN 300 MG PO CAPS
ORAL_CAPSULE | ORAL | 3 refills | Status: DC
  Filled 2022-03-31: qty 90, 21d supply, fill #0
  Filled 2022-04-19: qty 90, 44d supply, fill #1
  Filled 2022-06-02: qty 90, 30d supply, fill #2
  Filled 2022-07-14: qty 90, 30d supply, fill #3

## 2022-03-31 NOTE — Assessment & Plan Note (Addendum)
Persistent right-sided axial back pain with radiation down the right leg to the middle toes consistent with an L5 radiculitis, she has had several injections including right greater trochanteric bursa which did not help, right SI joint which only provided maybe 50% relief, pain is worse with sitting, flexion, Valsalva and she does have L5-S1 disc protrusion on CT lumbar spine. I think the neck step for her will be a L5-S1 interlaminar epidural however she would like to try additional conservative treatment, adding gabapentin and home conditioning. She has failed greater than 6 weeks of conservative treatments we will also proceed with the lumbar spine MRI, she works for [employer] and so she will let me know where she would like me to order it.  Update, she has had greater than 6 weeks of physician directed conservative treatment without improvement, proceeding with MRI for interventional planning.

## 2022-03-31 NOTE — Assessment & Plan Note (Signed)
[name] [name] is here for a physical, performed today, ordering routine labs. She is due for mammogram, Tdap, Shingrix No. 2, these were ordered today. Up-to-date on colon cancer screening.

## 2022-03-31 NOTE — Progress Notes (Addendum)
Subjective:    CC: Annual Physical Exam  HPI:  This patient is here for their annual physical  I reviewed the past medical history, family history, social history, surgical history, and allergies today and no changes were needed.  Please see the problem list section below in epic for further details.  Past Medical History: Past Medical History:  Diagnosis Date   Asthma    Essential hypertension 01/14/2015   GERD (gastroesophageal reflux disease)    Hydronephrosis, right    chronic   IBS (irritable bowel syndrome)    Incarcerated epigastric hernia 01/10/2017   Murmur    Past Surgical History: Past Surgical History:  Procedure Laterality Date   HAND SURGERY     LT 2002   INSERTION OF MESH N/A 01/10/2017   Procedure: INSERTION OF MESH;  Surgeon: Fanny Skates, MD;  Location: [facility];  Service: General;  Laterality: N/A;   PYELOPLASTY     RT kidney 1993   pyleoplasty     right kidney   TONSILLECTOMY     VENTRAL HERNIA REPAIR N/A 01/10/2017   Procedure: LAPAROSCOPIC REPAIR EPIGASTRIC HERNIA WITH MESH;  Surgeon: Fanny Skates, MD;  Location: [place];  Service: General;  Laterality: N/A;   Social History: Social History   Socioeconomic History   Marital status: Married    Spouse name: Not on file   Number of children: Not on file   Years of education: Not on file   Highest education level: Not on file  Occupational History   Occupation: Programmer, multimedia: [employer]    Comment: Works at [employer]  Tobacco Use   Smoking status: Never   Smokeless tobacco: Never  Vaping Use   Vaping Use: Never used  Substance and Sexual Activity   Alcohol use: Yes    Comment: occasionally   Drug use: No   Sexual activity: Yes    Birth control/protection: Post-menopausal  Other Topics Concern   Not on file  Social History Narrative   Not on file   Social Determinants of Health   Financial Resource Strain: Not on file  Food Insecurity: Not on file  Transportation Needs: Not on file   Physical Activity: Not on file  Stress: Not on file  Social Connections: Not on file   Family History: Family History  Problem Relation Age of Onset   Alcohol abuse Father    Depression Father    Stroke Father    Hypertension Father    Heart attack Father    Esophageal cancer Father    Breast cancer Mother        metastatic to lungs   Heart disease Mother    Hypertension Mother    Cancer Mother        breast and lung   Heart attack Mother    Mental illness Paternal Aunt    Allergies: Allergies  Allergen Reactions   Bee Venom Dermatitis and Other (See Comments)    Cellulitis/staph infections following bee stings   Sulfonamide Derivatives Rash    Blisters in mucous membranes   Medications: See med rec.  Review of Systems: No headache, visual changes, nausea, vomiting, diarrhea, constipation, dizziness, abdominal pain, skin rash, fevers, chills, night sweats, swollen lymph nodes, weight loss, chest pain, body aches, joint swelling, muscle aches, shortness of breath, mood changes, visual or auditory hallucinations.  Objective:    General: Well Developed, well nourished, and in no acute distress.  Neuro: Alert and oriented x3, extra-ocular muscles intact, sensation grossly  intact. Cranial nerves II through XII are intact, motor, sensory, and coordinative functions are all intact. HEENT: Normocephalic, atraumatic, pupils equal round reactive to light, neck supple, no masses, no lymphadenopathy, thyroid nonpalpable. Oropharynx, nasopharynx, external ear canals are unremarkable. Skin: Warm and dry, no rashes noted.  Cardiac: Regular rate and rhythm, no murmurs rubs or gallops.  Respiratory: Clear to auscultation bilaterally. Not using accessory muscles, speaking in full sentences.  Abdominal: Soft, nontender, nondistended, positive bowel sounds, no masses, no organomegaly.  Musculoskeletal: Shoulder, elbow, wrist, hip, knee, ankle stable, and with full range of  motion.  Impression and Recommendations:    The patient was counselled, risk factors were discussed, anticipatory guidance given.  Annual physical exam [name] [name] is here for a physical, performed today, ordering routine labs. She is due for mammogram, Tdap, Shingrix No. 2, these were ordered today. Up-to-date on colon cancer screening.  Lumbar degenerative disc disease Persistent right-sided axial back pain with radiation down the right leg to the middle toes consistent with an L5 radiculitis, she has had several injections including right greater trochanteric bursa which did not help, right SI joint which only provided maybe 50% relief, pain is worse with sitting, flexion, Valsalva and she does have L5-S1 disc protrusion on CT lumbar spine. I think the neck step for her will be a L5-S1 interlaminar epidural however she would like to try additional conservative treatment, adding gabapentin and home conditioning. She has failed greater than 6 weeks of conservative treatments we will also proceed with the lumbar spine MRI, she works for [employer] and so she will let me know where she would like me to order it.  Update, she has had greater than 6 weeks of physician directed conservative treatment without improvement, proceeding with MRI for interventional planning.  ____________________________________________ Gwen Her. Dianah Field, M.D., ABFM., CAQSM., AME. Primary Care and Sports Medicine [facility]  Adjunct Professor of [place] [employer]  Risk manager

## 2022-03-31 NOTE — Addendum Note (Signed)
Addended by: Tarri Glenn A on: 03/31/2022 09:46 AM   Modules accepted: Orders

## 2022-03-31 NOTE — Addendum Note (Signed)
Addended by: Silverio Decamp on: 03/31/2022 09:46 AM   Modules accepted: Orders

## 2022-04-01 ENCOUNTER — Other Ambulatory Visit (HOSPITAL_BASED_OUTPATIENT_CLINIC_OR_DEPARTMENT_OTHER): Payer: Self-pay

## 2022-04-01 MED ORDER — ALBUTEROL SULFATE HFA 108 (90 BASE) MCG/ACT IN AERS
2.0000 | INHALATION_SPRAY | Freq: Four times a day (QID) | RESPIRATORY_TRACT | 11 refills | Status: DC | PRN
  Filled 2022-04-01: qty 20.1, 75d supply, fill #0
  Filled 2022-05-14 – 2022-05-27 (×2): qty 20.1, 75d supply, fill #1
  Filled 2022-05-27 (×2): qty 6.7, 25d supply, fill #1
  Filled 2022-07-15: qty 6.7, 25d supply, fill #2
  Filled 2022-08-30: qty 6.7, 25d supply, fill #3
  Filled 2022-10-10: qty 6.7, 25d supply, fill #4
  Filled 2023-01-04: qty 6.7, 25d supply, fill #5
  Filled ????-??-??: fill #1

## 2022-04-15 ENCOUNTER — Other Ambulatory Visit: Payer: Self-pay | Admitting: Sports Medicine

## 2022-04-15 DIAGNOSIS — E782 Mixed hyperlipidemia: Secondary | ICD-10-CM

## 2022-04-16 LAB — COMPLETE METABOLIC PANEL WITH GFR
AG Ratio: 2 (calc) (ref 1.0–2.5)
ALT: 27 U/L (ref 6–29)
AST: 19 U/L (ref 10–35)
Albumin: 4.7 g/dL (ref 3.6–5.1)
Alkaline phosphatase (APISO): 97 U/L (ref 37–153)
BUN: 17 mg/dL (ref 7–25)
CO2: 30 mmol/L (ref 20–32)
Calcium: 9.9 mg/dL (ref 8.6–10.4)
Chloride: 105 mmol/L (ref 98–110)
Creat: 0.86 mg/dL (ref 0.50–1.03)
Globulin: 2.4 g/dL (calc) (ref 1.9–3.7)
Glucose, Bld: 87 mg/dL (ref 65–99)
Potassium: 4.7 mmol/L (ref 3.5–5.3)
Sodium: 142 mmol/L (ref 135–146)
Total Bilirubin: 0.7 mg/dL (ref 0.2–1.2)
Total Protein: 7.1 g/dL (ref 6.1–8.1)
eGFR: 80 mL/min/{1.73_m2} (ref >=60)

## 2022-04-16 LAB — CBC
HCT: 42.9 % (ref 35.0–45.0)
Hemoglobin: 14.7 g/dL (ref 11.7–15.5)
MCH: 31.7 pg (ref 27.0–33.0)
MCHC: 34.3 g/dL (ref 32.0–36.0)
MCV: 92.5 fL (ref 80.0–100.0)
MPV: 11.6 fL (ref 7.5–12.5)
Platelets: 260 10*3/uL (ref 140–400)
RBC: 4.64 10*6/uL (ref 3.80–5.10)
RDW: 13 % (ref 11.0–15.0)
WBC: 6.6 10*3/uL (ref 3.8–10.8)

## 2022-04-16 LAB — LIPID PANEL
Cholesterol: 214 mg/dL — ABNORMAL HIGH (ref <200)
HDL: 58 mg/dL (ref >=50)
LDL Cholesterol (Calc): 141 mg/dL (calc) — ABNORMAL HIGH
Non-HDL Cholesterol (Calc): 156 mg/dL (calc) — ABNORMAL HIGH (ref <130)
Total CHOL/HDL Ratio: 3.7 (calc) (ref <5.0)
Triglycerides: 63 mg/dL (ref <150)

## 2022-04-16 LAB — HEMOGLOBIN A1C
Hgb A1c MFr Bld: 5.9 % of total Hgb — ABNORMAL HIGH (ref <5.7)
Mean Plasma Glucose: 123 mg/dL
eAG (mmol/L): 6.8 mmol/L

## 2022-04-16 LAB — TSH: TSH: 1.39 mIU/L

## 2022-04-17 ENCOUNTER — Encounter: Payer: Self-pay | Admitting: Sports Medicine

## 2022-04-19 ENCOUNTER — Other Ambulatory Visit: Payer: Self-pay | Admitting: Sports Medicine

## 2022-04-19 ENCOUNTER — Other Ambulatory Visit (HOSPITAL_BASED_OUTPATIENT_CLINIC_OR_DEPARTMENT_OTHER): Payer: Self-pay

## 2022-04-19 ENCOUNTER — Other Ambulatory Visit: Payer: Self-pay

## 2022-04-19 DIAGNOSIS — M25551 Pain in right hip: Secondary | ICD-10-CM

## 2022-04-19 DIAGNOSIS — M7061 Trochanteric bursitis, right hip: Secondary | ICD-10-CM

## 2022-04-19 DIAGNOSIS — F4321 Adjustment disorder with depressed mood: Secondary | ICD-10-CM

## 2022-04-19 DIAGNOSIS — K7581 Nonalcoholic steatohepatitis (NASH): Secondary | ICD-10-CM

## 2022-04-19 MED ORDER — TRAMADOL HCL 50 MG PO TABS
50.0000 mg | ORAL_TABLET | Freq: Three times a day (TID) | ORAL | 0 refills | Status: DC | PRN
  Filled 2022-04-19: qty 90, 30d supply, fill #0

## 2022-04-19 MED ORDER — QUETIAPINE FUMARATE 50 MG PO TABS
50.0000 mg | ORAL_TABLET | Freq: Every day | ORAL | 2 refills | Status: DC
  Filled 2022-05-03: qty 30, 30d supply, fill #0
  Filled 2022-06-02: qty 30, 30d supply, fill #1
  Filled 2022-07-01: qty 30, 30d supply, fill #2

## 2022-04-19 MED ORDER — IRBESARTAN 150 MG PO TABS
150.0000 mg | ORAL_TABLET | Freq: Every day | ORAL | 0 refills | Status: DC
  Filled 2022-04-19: qty 30, 30d supply, fill #0

## 2022-04-22 ENCOUNTER — Ambulatory Visit
Admission: RE | Admit: 2022-04-22 | Discharge: 2022-04-22 | Disposition: A | Discharge status: Home / Self Care | Payer: 59 | Source: Ambulatory Visit | Attending: Sports Medicine | Admitting: Sports Medicine

## 2022-04-22 DIAGNOSIS — M5136 Other intervertebral disc degeneration, lumbar region: Secondary | ICD-10-CM

## 2022-04-22 DIAGNOSIS — M5116 Intervertebral disc disorders with radiculopathy, lumbar region: Secondary | ICD-10-CM | POA: Diagnosis not present

## 2022-05-03 ENCOUNTER — Other Ambulatory Visit (HOSPITAL_BASED_OUTPATIENT_CLINIC_OR_DEPARTMENT_OTHER): Payer: Self-pay

## 2022-05-03 ENCOUNTER — Other Ambulatory Visit: Payer: Self-pay | Admitting: Sports Medicine

## 2022-05-03 DIAGNOSIS — M51369 Other intervertebral disc degeneration, lumbar region without mention of lumbar back pain or lower extremity pain: Secondary | ICD-10-CM

## 2022-05-03 DIAGNOSIS — M5136 Other intervertebral disc degeneration, lumbar region: Secondary | ICD-10-CM

## 2022-05-12 ENCOUNTER — Ambulatory Visit (INDEPENDENT_AMBULATORY_CARE_PROVIDER_SITE_OTHER): Payer: Commercial Managed Care - PPO | Admitting: Sports Medicine

## 2022-05-12 DIAGNOSIS — M51369 Other intervertebral disc degeneration, lumbar region without mention of lumbar back pain or lower extremity pain: Secondary | ICD-10-CM

## 2022-05-12 DIAGNOSIS — M5136 Other intervertebral disc degeneration, lumbar region: Secondary | ICD-10-CM | POA: Diagnosis not present

## 2022-05-12 DIAGNOSIS — E782 Mixed hyperlipidemia: Secondary | ICD-10-CM | POA: Diagnosis not present

## 2022-05-12 NOTE — Progress Notes (Signed)
    Procedures performed today:    None.  Independent interpretation of notes and tests performed by another provider:   None.  Brief History, Exam, Impression, and Recommendations:    Lumbar degenerative disc disease It has been a long road for Mcgloin, she has had persistent right-sided axial back pain with radiation down the right leg to the middle toes consistent with an L5 distribution radiculitis. She has had several injections, as her pain is presented as various other pathologies, including greater trochanteric bursa injection, right SI joint injection with provided maybe only 50% relief, pain now is worse with sitting, flexion Valsalva, we obtained a lumbar spine MRI that showed some mild disc disease, the L5-S1 disc did appear to come close to the L5 nerve root so we ordered an epidural, she is going to schedule this. She takes gabapentin at night, is unable to tolerate this during the day, tramadol at a full tab during the day also results in some sedation, so she will try half tab here and there through the day. I would like to touch base with her about a month or 6 weeks after she gets her epidural to see how things are going.  Hyperlipidemia Will get more consistent with her Crestor and we can recheck this in a couple of months, may be mid March.    ____________________________________________ Gwen Her. Dianah Field, M.D., ABFM., CAQSM., AME. Primary Care and Sports Medicine [place] [facility]  Adjunct Professor of [place] [employer] [facility] [employer]  Risk manager

## 2022-05-12 NOTE — Assessment & Plan Note (Signed)
Will get more consistent with her Crestor and we can recheck this in a couple of months, may be mid March.

## 2022-05-12 NOTE — Assessment & Plan Note (Signed)
It has been a long road for [name] [name], she has had persistent right-sided axial back pain with radiation down the right leg to the middle toes consistent with an L5 distribution radiculitis. She has had several injections, as her pain is presented as various other pathologies, including greater trochanteric bursa injection, right SI joint injection with provided maybe only 50% relief, pain now is worse with sitting, flexion Valsalva, we obtained a lumbar spine MRI that showed some mild disc disease, the L5-S1 disc did appear to come close to the L5 nerve root so we ordered an epidural, she is going to schedule this. She takes gabapentin at night, is unable to tolerate this during the day, tramadol at a full tab during the day also results in some sedation, so she will try half tab here and there through the day. I would like to touch base with her about a month or 6 weeks after she gets her epidural to see how things are going.

## 2022-05-14 ENCOUNTER — Other Ambulatory Visit (HOSPITAL_BASED_OUTPATIENT_CLINIC_OR_DEPARTMENT_OTHER): Payer: Self-pay

## 2022-05-14 ENCOUNTER — Other Ambulatory Visit: Payer: Self-pay | Admitting: Sports Medicine

## 2022-05-16 ENCOUNTER — Other Ambulatory Visit (HOSPITAL_BASED_OUTPATIENT_CLINIC_OR_DEPARTMENT_OTHER): Payer: Self-pay

## 2022-05-16 ENCOUNTER — Other Ambulatory Visit: Payer: Self-pay

## 2022-05-16 MED ORDER — IRBESARTAN 150 MG PO TABS
150.0000 mg | ORAL_TABLET | Freq: Every day | ORAL | 0 refills | Status: DC
  Filled 2022-05-16: qty 30, 30d supply, fill #0

## 2022-05-27 ENCOUNTER — Other Ambulatory Visit (HOSPITAL_BASED_OUTPATIENT_CLINIC_OR_DEPARTMENT_OTHER): Payer: Self-pay

## 2022-06-02 ENCOUNTER — Other Ambulatory Visit: Payer: Self-pay | Admitting: Sports Medicine

## 2022-06-02 DIAGNOSIS — M2241 Chondromalacia patellae, right knee: Secondary | ICD-10-CM

## 2022-06-03 ENCOUNTER — Other Ambulatory Visit: Payer: Self-pay

## 2022-06-03 ENCOUNTER — Other Ambulatory Visit (HOSPITAL_BASED_OUTPATIENT_CLINIC_OR_DEPARTMENT_OTHER): Payer: Self-pay

## 2022-06-03 MED ORDER — DICLOFENAC SODIUM 1 % EX GEL
4.0000 g | Freq: Four times a day (QID) | CUTANEOUS | 11 refills | Status: AC
  Filled 2022-06-03: qty 100, 7d supply, fill #0
  Filled 2022-07-14: qty 100, 7d supply, fill #1

## 2022-06-23 ENCOUNTER — Other Ambulatory Visit: Payer: Self-pay | Admitting: Sports Medicine

## 2022-06-23 ENCOUNTER — Other Ambulatory Visit (HOSPITAL_BASED_OUTPATIENT_CLINIC_OR_DEPARTMENT_OTHER): Payer: Self-pay

## 2022-06-23 MED ORDER — IRBESARTAN 150 MG PO TABS
150.0000 mg | ORAL_TABLET | Freq: Every day | ORAL | 0 refills | Status: DC
  Filled 2022-06-23: qty 30, 30d supply, fill #0

## 2022-07-14 ENCOUNTER — Other Ambulatory Visit: Payer: Self-pay | Admitting: Sports Medicine

## 2022-07-14 ENCOUNTER — Encounter: Payer: Self-pay | Admitting: Sports Medicine

## 2022-07-14 ENCOUNTER — Other Ambulatory Visit (HOSPITAL_BASED_OUTPATIENT_CLINIC_OR_DEPARTMENT_OTHER): Payer: Self-pay

## 2022-07-14 ENCOUNTER — Other Ambulatory Visit: Payer: Self-pay

## 2022-07-14 DIAGNOSIS — M7061 Trochanteric bursitis, right hip: Secondary | ICD-10-CM

## 2022-07-14 DIAGNOSIS — K7581 Nonalcoholic steatohepatitis (NASH): Secondary | ICD-10-CM

## 2022-07-14 DIAGNOSIS — M25551 Pain in right hip: Secondary | ICD-10-CM

## 2022-07-15 ENCOUNTER — Other Ambulatory Visit: Payer: Self-pay | Admitting: Sports Medicine

## 2022-07-15 ENCOUNTER — Other Ambulatory Visit (HOSPITAL_BASED_OUTPATIENT_CLINIC_OR_DEPARTMENT_OTHER): Payer: Self-pay

## 2022-07-15 ENCOUNTER — Other Ambulatory Visit: Payer: Self-pay

## 2022-07-15 DIAGNOSIS — F4321 Adjustment disorder with depressed mood: Secondary | ICD-10-CM

## 2022-07-15 MED ORDER — PHENTERMINE HCL 37.5 MG PO TABS
37.5000 mg | ORAL_TABLET | Freq: Every day | ORAL | 0 refills | Status: DC
  Filled 2022-07-15: qty 30, 30d supply, fill #0

## 2022-07-15 MED ORDER — IRBESARTAN 150 MG PO TABS
150.0000 mg | ORAL_TABLET | Freq: Every day | ORAL | 0 refills | Status: DC
  Filled 2022-07-15 – 2022-07-25 (×2): qty 30, 30d supply, fill #0

## 2022-07-15 MED ORDER — QUETIAPINE FUMARATE 50 MG PO TABS
50.0000 mg | ORAL_TABLET | Freq: Every day | ORAL | 2 refills | Status: DC
  Filled 2022-07-15 – 2022-07-28 (×2): qty 30, 30d supply, fill #0
  Filled 2022-08-30: qty 30, 30d supply, fill #1
  Filled 2022-09-28: qty 30, 30d supply, fill #2

## 2022-07-15 MED ORDER — TRAMADOL HCL 50 MG PO TABS
50.0000 mg | ORAL_TABLET | Freq: Three times a day (TID) | ORAL | 0 refills | Status: DC | PRN
  Filled 2022-07-15: qty 90, 30d supply, fill #0

## 2022-07-25 ENCOUNTER — Other Ambulatory Visit (HOSPITAL_BASED_OUTPATIENT_CLINIC_OR_DEPARTMENT_OTHER): Payer: Self-pay

## 2022-07-25 ENCOUNTER — Other Ambulatory Visit: Payer: Self-pay

## 2022-07-28 ENCOUNTER — Other Ambulatory Visit: Payer: Self-pay

## 2022-07-28 ENCOUNTER — Other Ambulatory Visit (HOSPITAL_BASED_OUTPATIENT_CLINIC_OR_DEPARTMENT_OTHER): Payer: Self-pay

## 2022-08-26 ENCOUNTER — Other Ambulatory Visit (HOSPITAL_BASED_OUTPATIENT_CLINIC_OR_DEPARTMENT_OTHER): Payer: Self-pay

## 2022-08-26 ENCOUNTER — Other Ambulatory Visit: Payer: Self-pay | Admitting: Sports Medicine

## 2022-08-26 MED ORDER — IRBESARTAN 150 MG PO TABS
150.0000 mg | ORAL_TABLET | Freq: Every day | ORAL | 0 refills | Status: DC
  Filled 2022-08-26: qty 30, 30d supply, fill #0

## 2022-08-30 ENCOUNTER — Other Ambulatory Visit: Payer: Self-pay | Admitting: Sports Medicine

## 2022-08-30 ENCOUNTER — Other Ambulatory Visit (HOSPITAL_BASED_OUTPATIENT_CLINIC_OR_DEPARTMENT_OTHER): Payer: Self-pay

## 2022-08-30 DIAGNOSIS — M7061 Trochanteric bursitis, right hip: Secondary | ICD-10-CM

## 2022-08-30 DIAGNOSIS — M25551 Pain in right hip: Secondary | ICD-10-CM

## 2022-08-30 MED ORDER — TRAMADOL HCL 50 MG PO TABS
50.0000 mg | ORAL_TABLET | Freq: Three times a day (TID) | ORAL | 0 refills | Status: DC | PRN
  Filled 2022-08-30: qty 90, 30d supply, fill #0

## 2022-08-31 ENCOUNTER — Other Ambulatory Visit: Payer: Self-pay

## 2022-09-28 ENCOUNTER — Other Ambulatory Visit: Payer: Self-pay

## 2022-09-28 ENCOUNTER — Other Ambulatory Visit: Payer: Self-pay | Admitting: Sports Medicine

## 2022-09-28 ENCOUNTER — Other Ambulatory Visit (HOSPITAL_BASED_OUTPATIENT_CLINIC_OR_DEPARTMENT_OTHER): Payer: Self-pay

## 2022-09-28 DIAGNOSIS — K7581 Nonalcoholic steatohepatitis (NASH): Secondary | ICD-10-CM

## 2022-09-28 DIAGNOSIS — M5136 Other intervertebral disc degeneration, lumbar region: Secondary | ICD-10-CM

## 2022-09-28 MED ORDER — GABAPENTIN 300 MG PO CAPS
300.0000 mg | ORAL_CAPSULE | Freq: Three times a day (TID) | ORAL | 3 refills | Status: AC
  Filled 2022-09-28: qty 270, 90d supply, fill #0
  Filled 2023-01-04: qty 270, 90d supply, fill #1

## 2022-09-28 MED ORDER — IRBESARTAN 150 MG PO TABS
150.0000 mg | ORAL_TABLET | Freq: Every day | ORAL | 3 refills | Status: DC
  Filled 2022-09-28 – 2022-10-10 (×2): qty 90, 90d supply, fill #0
  Filled 2023-01-04: qty 90, 90d supply, fill #1
  Filled 2023-04-17: qty 90, 90d supply, fill #2
  Filled 2023-07-23: qty 90, 90d supply, fill #3

## 2022-10-10 ENCOUNTER — Other Ambulatory Visit: Payer: Self-pay | Admitting: Sports Medicine

## 2022-10-10 ENCOUNTER — Other Ambulatory Visit (HOSPITAL_BASED_OUTPATIENT_CLINIC_OR_DEPARTMENT_OTHER): Payer: Self-pay

## 2022-10-10 DIAGNOSIS — M25551 Pain in right hip: Secondary | ICD-10-CM

## 2022-10-10 DIAGNOSIS — K7581 Nonalcoholic steatohepatitis (NASH): Secondary | ICD-10-CM

## 2022-10-10 DIAGNOSIS — M7061 Trochanteric bursitis, right hip: Secondary | ICD-10-CM

## 2022-10-10 MED ORDER — TRAMADOL HCL 50 MG PO TABS
50.0000 mg | ORAL_TABLET | Freq: Three times a day (TID) | ORAL | 0 refills | Status: DC | PRN
  Filled 2022-10-10: qty 90, 30d supply, fill #0

## 2022-10-25 ENCOUNTER — Other Ambulatory Visit (HOSPITAL_BASED_OUTPATIENT_CLINIC_OR_DEPARTMENT_OTHER): Payer: Self-pay

## 2022-10-25 ENCOUNTER — Other Ambulatory Visit: Payer: Self-pay | Admitting: Sports Medicine

## 2022-10-25 ENCOUNTER — Ambulatory Visit: Payer: Commercial Managed Care - PPO | Admitting: Sports Medicine

## 2022-10-25 VITALS — Ht 61.0 in | Wt 152.0 lb

## 2022-10-25 DIAGNOSIS — M722 Plantar fascial fibromatosis: Secondary | ICD-10-CM | POA: Diagnosis not present

## 2022-10-25 DIAGNOSIS — E782 Mixed hyperlipidemia: Secondary | ICD-10-CM | POA: Diagnosis not present

## 2022-10-25 DIAGNOSIS — F4321 Adjustment disorder with depressed mood: Secondary | ICD-10-CM

## 2022-10-25 DIAGNOSIS — M79672 Pain in left foot: Secondary | ICD-10-CM | POA: Insufficient documentation

## 2022-10-25 DIAGNOSIS — E663 Overweight: Secondary | ICD-10-CM | POA: Diagnosis not present

## 2022-10-25 MED ORDER — QUETIAPINE FUMARATE 50 MG PO TABS
50.0000 mg | ORAL_TABLET | Freq: Every day | ORAL | 2 refills | Status: DC
Start: 2022-10-25 — End: 2023-07-23
  Filled 2022-10-25: qty 90, 90d supply, fill #0
  Filled 2023-01-25: qty 90, 90d supply, fill #1
  Filled 2023-04-27: qty 90, 90d supply, fill #2

## 2022-10-25 MED ORDER — PHENTERMINE HCL 37.5 MG PO TABS
37.5000 mg | ORAL_TABLET | Freq: Every morning | ORAL | 0 refills | Status: DC
  Filled 2022-10-25: qty 30, 30d supply, fill #0

## 2022-10-25 NOTE — Progress Notes (Addendum)
    Procedures performed today:    None.  Independent interpretation of notes and tests performed by another provider:   None.  Brief History, Exam, Impression, and Recommendations:    Overweight Lior is overweight, she desires to start phentermine, we did approximate 3 months ago and she did not follow-up, she understands she needs to commit to 3 to 6 months of regular monthly weight checks.  Plantar fasciitis, left Increasing pain in the left heel worse in the mornings, she does have some flexible pes cavus, adding home conditioning, she will avoid barefoot walking, gel cups, return to see me in 4 to 6 weeks, injection +/- custom orthotics if not better.  Hyperlipidemia Has been consistent with Crestor, rechecking lipids.    ____________________________________________ Ihor Austin. Benjamin Stain, M.D., ABFM., CAQSM., AME. Primary Care and Sports Medicine [place] [facility]  Adjunct Professor of Family Medicine  [place] of [facility] of Medicine  Restaurant manager, fast food

## 2022-10-25 NOTE — Assessment & Plan Note (Signed)
Increasing pain in the left heel worse in the mornings, she does have some flexible pes cavus, adding home conditioning, she will avoid barefoot walking, gel cups, return to see me in 4 to 6 weeks, injection +/- custom orthotics if not better.

## 2022-10-25 NOTE — Assessment & Plan Note (Signed)
[name] [name] is overweight, she desires to start phentermine, we did approximate 3 months ago and she did not follow-up, she understands she needs to commit to 3 to 6 months of regular monthly weight checks.

## 2022-10-25 NOTE — Addendum Note (Signed)
Addended by: Monica Becton on: 10/25/2022 09:51 AM   Modules accepted: Orders

## 2022-10-25 NOTE — Assessment & Plan Note (Signed)
Has been consistent with Crestor, rechecking lipids.

## 2022-10-26 LAB — LIPID PANEL
Cholesterol: 161 mg/dL (ref <200)
HDL: 57 mg/dL (ref >=50)
LDL Cholesterol (Calc): 89 mg/dL (calc)
Non-HDL Cholesterol (Calc): 104 mg/dL (calc) (ref <130)
Total CHOL/HDL Ratio: 2.8 (calc) (ref <5.0)
Triglycerides: 61 mg/dL (ref <150)

## 2022-10-26 LAB — COMPREHENSIVE METABOLIC PANEL
AG Ratio: 2.2 (calc) (ref 1.0–2.5)
ALT: 31 U/L — ABNORMAL HIGH (ref 6–29)
AST: 20 U/L (ref 10–35)
Albumin: 4.2 g/dL (ref 3.6–5.1)
Alkaline phosphatase (APISO): 85 U/L (ref 37–153)
BUN: 14 mg/dL (ref 7–25)
CO2: 26 mmol/L (ref 20–32)
Calcium: 9.1 mg/dL (ref 8.6–10.4)
Chloride: 106 mmol/L (ref 98–110)
Creat: 0.89 mg/dL (ref 0.50–1.03)
Globulin: 1.9 g/dL (calc) (ref 1.9–3.7)
Glucose, Bld: 97 mg/dL (ref 65–99)
Potassium: 4.8 mmol/L (ref 3.5–5.3)
Sodium: 140 mmol/L (ref 135–146)
Total Bilirubin: 0.5 mg/dL (ref 0.2–1.2)
Total Protein: 6.1 g/dL (ref 6.1–8.1)

## 2022-10-26 LAB — HEMOGLOBIN A1C
Hgb A1c MFr Bld: 5.9 % of total Hgb — ABNORMAL HIGH (ref <5.7)
Mean Plasma Glucose: 123 mg/dL
eAG (mmol/L): 6.8 mmol/L

## 2022-10-26 LAB — CBC
HCT: 40.8 % (ref 35.0–45.0)
Hemoglobin: 13.5 g/dL (ref 11.7–15.5)
MCH: 30.8 pg (ref 27.0–33.0)
MCHC: 33.1 g/dL (ref 32.0–36.0)
MCV: 93.2 fL (ref 80.0–100.0)
MPV: 11.2 fL (ref 7.5–12.5)
Platelets: 203 10*3/uL (ref 140–400)
RBC: 4.38 10*6/uL (ref 3.80–5.10)
RDW: 13.4 % (ref 11.0–15.0)
WBC: 5.8 10*3/uL (ref 3.8–10.8)

## 2022-11-22 ENCOUNTER — Other Ambulatory Visit (HOSPITAL_BASED_OUTPATIENT_CLINIC_OR_DEPARTMENT_OTHER): Payer: Self-pay

## 2022-11-22 ENCOUNTER — Encounter: Payer: Self-pay | Admitting: Sports Medicine

## 2022-11-22 ENCOUNTER — Ambulatory Visit: Payer: Commercial Managed Care - PPO | Admitting: Sports Medicine

## 2022-11-22 VITALS — BP 117/77 | HR 89 | Ht 61.0 in | Wt 151.0 lb

## 2022-11-22 DIAGNOSIS — E663 Overweight: Secondary | ICD-10-CM | POA: Diagnosis not present

## 2022-11-22 DIAGNOSIS — M79672 Pain in left foot: Secondary | ICD-10-CM | POA: Diagnosis not present

## 2022-11-22 MED ORDER — PHENTERMINE HCL 37.5 MG PO TABS
37.5000 mg | ORAL_TABLET | Freq: Every morning | ORAL | 0 refills | Status: DC
Start: 2022-11-22 — End: 2022-12-20
  Filled 2022-11-22: qty 30, 30d supply, fill #0

## 2022-11-22 NOTE — Assessment & Plan Note (Signed)
1 pound weight loss after the first month of phentermine, she did get a bit constipated, I would like her to do MiraLAX 1 capful dissolved in water twice a day until stooling regularly and then Colace daily with phentermine. Will refill, we will see her back in a month, entering the second month. If she does not lose weight we can consider compounded semaglutide.

## 2022-11-22 NOTE — Assessment & Plan Note (Signed)
At this point there has been greater than 6 weeks of pain left plantar fascial origin as well as dorsal second through fourth metatarsal shafts. X-rays unrevealing. Proceeding with foot and heel MRI.

## 2022-11-22 NOTE — Progress Notes (Signed)
    Procedures performed today:    None.  Independent interpretation of notes and tests performed by another provider:   None.  Brief History, Exam, Impression, and Recommendations:    Overweight 1 pound weight loss after the first month of phentermine, she did get a bit constipated, I would like her to do MiraLAX 1 capful dissolved in water twice a day until stooling regularly and then Colace daily with phentermine. Will refill, we will see her back in a month, entering the second month. If she does not lose weight we can consider compounded semaglutide.  Left foot pain At this point there has been greater than 6 weeks of pain left plantar fascial origin as well as dorsal second through fourth metatarsal shafts. X-rays unrevealing. Proceeding with foot and heel MRI.    ____________________________________________ Ihor Austin. Benjamin Stain, M.D., ABFM., CAQSM., AME. Primary Care and Sports Medicine [place] [facility]  Adjunct Professor of Family Medicine  [place] [employer] [facility] [employer]  Restaurant manager, fast food

## 2022-11-27 ENCOUNTER — Ambulatory Visit (INDEPENDENT_AMBULATORY_CARE_PROVIDER_SITE_OTHER): Payer: Commercial Managed Care - PPO

## 2022-11-27 DIAGNOSIS — M79672 Pain in left foot: Secondary | ICD-10-CM

## 2022-11-27 DIAGNOSIS — M19072 Primary osteoarthritis, left ankle and foot: Secondary | ICD-10-CM | POA: Diagnosis not present

## 2022-11-27 DIAGNOSIS — M2012 Hallux valgus (acquired), left foot: Secondary | ICD-10-CM | POA: Diagnosis not present

## 2022-12-02 ENCOUNTER — Encounter (INDEPENDENT_AMBULATORY_CARE_PROVIDER_SITE_OTHER): Payer: Commercial Managed Care - PPO | Admitting: Sports Medicine

## 2022-12-02 DIAGNOSIS — M79672 Pain in left foot: Secondary | ICD-10-CM

## 2022-12-03 NOTE — Telephone Encounter (Signed)

## 2022-12-09 ENCOUNTER — Other Ambulatory Visit (INDEPENDENT_AMBULATORY_CARE_PROVIDER_SITE_OTHER): Payer: Commercial Managed Care - PPO

## 2022-12-09 ENCOUNTER — Ambulatory Visit (INDEPENDENT_AMBULATORY_CARE_PROVIDER_SITE_OTHER): Payer: Commercial Managed Care - PPO | Admitting: Sports Medicine

## 2022-12-09 DIAGNOSIS — M79672 Pain in left foot: Secondary | ICD-10-CM

## 2022-12-09 NOTE — Progress Notes (Signed)
    Procedures performed today:    Procedure: Real-time Ultrasound Guided injection of the left peroneal sheath Device: Samsung HS60  Verbal informed consent obtained.  Time-out conducted.  Noted no overlying erythema, induration, or other signs of local infection.  Skin prepped in a sterile fashion.  Local anesthesia: Topical Ethyl chloride.  With sterile technique and under real time ultrasound guidance: Mild peroneal sheath effusion noted, 1 cc Kenalog 40, 1 cc lidocaine, 1 cc bupivacaine injected easily Completed without difficulty  Advised to call if fevers/chills, erythema, induration, drainage, or persistent bleeding.  Images permanently stored and available for review in PACS.  Impression: Technically successful ultrasound guided injection.  Independent interpretation of notes and tests performed by another provider:   None.  Brief History, Exam, Impression, and Recommendations:    Left foot pain Very pleasant 57 year old female, multifactorial chronic left foot pain, conservative treatment ineffective ultimately MRI showed peroneal split tearing, her pain is somewhat laterally. Today we are doing a peroneal sheath injection, she ordered a boot on [employer] and she will wear this for about a week following the injection. Return to see me in 6 weeks.    ____________________________________________ Ihor Austin. Benjamin Stain, M.D., ABFM., CAQSM., AME. Primary Care and Sports Medicine [place] [facility]  Adjunct Professor of Family Medicine  [place] of [facility] of Medicine  Restaurant manager, fast food

## 2022-12-09 NOTE — Assessment & Plan Note (Addendum)
Very pleasant 57 year old female, multifactorial chronic left foot pain, conservative treatment ineffective ultimately MRI showed peroneal split tearing. Interestingly her pain is predominantly in the 4/5 intermetatarsal bursa. Today we 4/5 intermetatarsal bursa injection injection, she ordered a boot on [employer] and she will wear this for about a week following the injection, if she does not feel fantastic after a week she will do an additional week in the boot. Return to see me in 6 weeks.

## 2022-12-09 NOTE — Progress Notes (Signed)
    Procedures performed today:    Procedure: Real-time Ultrasound Guided injection of the left 4/5 intermetatarsal bursa Device: Samsung HS60  Verbal informed consent obtained.  Time-out conducted.  Noted no overlying erythema, induration, or other signs of local infection.  Skin prepped in a sterile fashion.  Local anesthesia: Topical Ethyl chloride.  With sterile technique and under real time ultrasound guidance: Noted some mild appearing interosseous muscle edema, 1 cc Kenalog 40, 1 cc lidocaine, 1 cc bupivacaine injected easily Completed without difficulty  Advised to call if fevers/chills, erythema, induration, drainage, or persistent bleeding.  Images permanently stored and available for review in PACS.  Impression: Technically successful ultrasound guided injection.  Independent interpretation of notes and tests performed by another provider:   None.  Brief History, Exam, Impression, and Recommendations:    Left foot pain Very pleasant 57 year old female, multifactorial chronic left foot pain, conservative treatment ineffective ultimately MRI showed peroneal split tearing. Interestingly her pain is predominantly in the 4/5 intermetatarsal bursa. Today we 4/5 intermetatarsal bursa injection injection, she ordered a boot on [employer] and she will wear this for about a week following the injection, if she does not feel fantastic after a week she will do an additional week in the boot. Return to see me in 6 weeks.   ____________________________________________ Ihor Austin. Benjamin Stain, M.D., ABFM., CAQSM., AME. Primary Care and Sports Medicine [place] [facility]  Adjunct Professor of Family Medicine  [place] of [facility] of Medicine  Restaurant manager, fast food

## 2022-12-20 ENCOUNTER — Ambulatory Visit: Payer: Commercial Managed Care - PPO | Admitting: Sports Medicine

## 2022-12-20 ENCOUNTER — Encounter: Payer: Self-pay | Admitting: Sports Medicine

## 2022-12-20 VITALS — BP 138/82 | HR 74 | Ht 61.0 in | Wt 153.0 lb

## 2022-12-20 DIAGNOSIS — E663 Overweight: Secondary | ICD-10-CM | POA: Diagnosis not present

## 2022-12-20 DIAGNOSIS — M79672 Pain in left foot: Secondary | ICD-10-CM

## 2022-12-20 NOTE — Assessment & Plan Note (Signed)
No weight loss, discontinue phentermine, declines compounded GLP-1's. Return as needed.

## 2022-12-20 NOTE — Assessment & Plan Note (Signed)
Very pleasant 57 year old female, multifactorial chronic left foot pain, conservative treatment ineffective ultimately MRI showed peroneal split tearing. Interestingly her pain is predominantly in the 4/5 intermetatarsal bursa. Today we 4/5 intermetatarsal bursa injection injection, she ordered a boot on [employer] and she will wear this for about a week following the injection, if she does not feel fantastic after a week she will do an additional week in the boot. Return to see me in 6 weeks.  Interim update 12/20/2022: Doing much better after 4/5 intermetatarsal bursa injection, she still has another 3 weeks in the boot.

## 2022-12-20 NOTE — Progress Notes (Signed)
    Procedures performed today:    None.  Independent interpretation of notes and tests performed by another provider:   None.  Brief History, Exam, Impression, and Recommendations:    Left foot pain Very pleasant 57 year old female, multifactorial chronic left foot pain, conservative treatment ineffective ultimately MRI showed peroneal split tearing. Interestingly her pain is predominantly in the 4/5 intermetatarsal bursa. Today we 4/5 intermetatarsal bursa injection injection, she ordered a boot on [employer] and she will wear this for about a week following the injection, if she does not feel fantastic after a week she will do an additional week in the boot. Return to see me in 6 weeks.  Interim update 12/20/2022: Doing much better after 4/5 intermetatarsal bursa injection, she still has another 3 weeks in the boot.  Overweight No weight loss, discontinue phentermine, declines compounded GLP-1's. Return as needed.    ____________________________________________ Ihor Austin. Benjamin Stain, M.D., ABFM., CAQSM., AME. Primary Care and Sports Medicine [place] [facility]  Adjunct Professor of Family Medicine  [place] of [facility] of Medicine  Restaurant manager, fast food

## 2023-01-04 ENCOUNTER — Other Ambulatory Visit: Payer: Self-pay | Admitting: Sports Medicine

## 2023-01-04 ENCOUNTER — Other Ambulatory Visit: Payer: Self-pay

## 2023-01-04 DIAGNOSIS — M25551 Pain in right hip: Secondary | ICD-10-CM

## 2023-01-04 DIAGNOSIS — M7061 Trochanteric bursitis, right hip: Secondary | ICD-10-CM

## 2023-01-05 ENCOUNTER — Other Ambulatory Visit (HOSPITAL_BASED_OUTPATIENT_CLINIC_OR_DEPARTMENT_OTHER): Payer: Self-pay

## 2023-01-05 MED ORDER — TRAMADOL HCL 50 MG PO TABS
50.0000 mg | ORAL_TABLET | Freq: Three times a day (TID) | ORAL | 0 refills | Status: DC | PRN
Start: 2023-01-05 — End: 2023-04-17
  Filled 2023-01-05: qty 90, 30d supply, fill #0

## 2023-01-20 ENCOUNTER — Encounter: Payer: Self-pay | Admitting: Sports Medicine

## 2023-01-20 ENCOUNTER — Ambulatory Visit: Payer: Commercial Managed Care - PPO | Admitting: Sports Medicine

## 2023-01-20 DIAGNOSIS — M79672 Pain in left foot: Secondary | ICD-10-CM | POA: Diagnosis not present

## 2023-01-20 DIAGNOSIS — S86312A Strain of muscle(s) and tendon(s) of peroneal muscle group at lower leg level, left leg, initial encounter: Secondary | ICD-10-CM

## 2023-01-20 NOTE — Assessment & Plan Note (Signed)
Pleasant 57 year old female, she does have a logical split tear left peroneus brevis, she will continue the boot for another 3 weeks for considering injection. Home PT given.

## 2023-01-20 NOTE — Assessment & Plan Note (Signed)
This is a very pleasant 57 year old female hide chronic multifactorial left foot pain, ultimately she failed conservative measures, MRI really only showed peroneal split tearing, her pain was predominantly at the 4/5 intermetatarsal bursa, we injected her intermetatarsal bursa back on 9 August and she did really well, she is pain-free at this location, she only has pain behind the lateral malleolus today. See below.

## 2023-01-20 NOTE — Progress Notes (Signed)
    Procedures performed today:    None.  Independent interpretation of notes and tests performed by another provider:   None.  Brief History, Exam, Impression, and Recommendations:    Longitudinal split tear left peroneus brevis Pleasant 57 year old female, she does have a logical split tear left peroneus brevis, she will continue the boot for another 3 weeks for considering injection. Home PT given.  Left foot pain This is a very pleasant 57 year old female hide chronic multifactorial left foot pain, ultimately she failed conservative measures, MRI really only showed peroneal split tearing, her pain was predominantly at the 4/5 intermetatarsal bursa, we injected her intermetatarsal bursa back on 9 August and she did really well, she is pain-free at this location, she only has pain behind the lateral malleolus today. See below.    ____________________________________________ Ihor Austin. Benjamin Stain, M.D., ABFM., CAQSM., AME. Primary Care and Sports Medicine [place] [facility]  Adjunct Professor of Family Medicine  [place] of [facility] of Medicine  Restaurant manager, fast food

## 2023-02-17 ENCOUNTER — Encounter: Payer: Self-pay | Admitting: Sports Medicine

## 2023-02-17 ENCOUNTER — Ambulatory Visit: Payer: Commercial Managed Care - PPO | Admitting: Sports Medicine

## 2023-02-17 DIAGNOSIS — S86312A Strain of muscle(s) and tendon(s) of peroneal muscle group at lower leg level, left leg, initial encounter: Secondary | ICD-10-CM | POA: Diagnosis not present

## 2023-02-17 NOTE — Assessment & Plan Note (Signed)
Pleasant 57 year old female, she has relatively chronic left lateral ankle pain, ultimately MRI showed a longitudinal split tear of the left peroneus brevis. We placed her in a boot for 3 weeks and she improved considerably. She is out of the boot, she still has some discomfort, due to her improvement in the boot I would like her to go back in the boot for another 3 weeks before we consider something like a peroneal tendon sheath injection. She can really just send me a MyChart message and let me know how things are going.

## 2023-02-17 NOTE — Progress Notes (Signed)
    Procedures performed today:    None.  Independent interpretation of notes and tests performed by another provider:   None.  Brief History, Exam, Impression, and Recommendations:    Longitudinal split tear left peroneus brevis Pleasant 57 year old female, she has relatively chronic left lateral ankle pain, ultimately MRI showed a longitudinal split tear of the left peroneus brevis. We placed her in a boot for 3 weeks and she improved considerably. She is out of the boot, she still has some discomfort, due to her improvement in the boot I would like her to go back in the boot for another 3 weeks before we consider something like a peroneal tendon sheath injection. She can really just send me a MyChart message and let me know how things are going.    ____________________________________________ Ihor Austin. Benjamin Stain, M.D., ABFM., CAQSM., AME. Primary Care and Sports Medicine [place] [facility]  Adjunct Professor of Family Medicine  [place] of [facility] of Medicine  Restaurant manager, fast food

## 2023-04-17 ENCOUNTER — Other Ambulatory Visit: Payer: Self-pay

## 2023-04-17 ENCOUNTER — Other Ambulatory Visit: Payer: Self-pay | Admitting: Sports Medicine

## 2023-04-17 ENCOUNTER — Other Ambulatory Visit (HOSPITAL_BASED_OUTPATIENT_CLINIC_OR_DEPARTMENT_OTHER): Payer: Self-pay

## 2023-04-17 DIAGNOSIS — M25551 Pain in right hip: Secondary | ICD-10-CM

## 2023-04-17 DIAGNOSIS — E782 Mixed hyperlipidemia: Secondary | ICD-10-CM

## 2023-04-17 DIAGNOSIS — J4521 Mild intermittent asthma with (acute) exacerbation: Secondary | ICD-10-CM

## 2023-04-17 DIAGNOSIS — M7061 Trochanteric bursitis, right hip: Secondary | ICD-10-CM

## 2023-04-17 MED ORDER — ALBUTEROL SULFATE HFA 108 (90 BASE) MCG/ACT IN AERS
2.0000 | INHALATION_SPRAY | Freq: Four times a day (QID) | RESPIRATORY_TRACT | 11 refills | Status: DC | PRN
  Filled 2023-04-17: qty 20.1, 75d supply, fill #0
  Filled 2023-07-23: qty 20.1, 75d supply, fill #1
  Filled 2023-10-18: qty 20.1, 75d supply, fill #2

## 2023-04-17 MED ORDER — ROSUVASTATIN CALCIUM 10 MG PO TABS
10.0000 mg | ORAL_TABLET | Freq: Every day | ORAL | 3 refills | Status: AC
  Filled 2023-04-17: qty 90, 90d supply, fill #0
  Filled 2024-04-08: qty 90, 90d supply, fill #1

## 2023-04-17 MED ORDER — TRAMADOL HCL 50 MG PO TABS
50.0000 mg | ORAL_TABLET | Freq: Three times a day (TID) | ORAL | 0 refills | Status: DC | PRN
  Filled 2023-04-17: qty 90, 30d supply, fill #0

## 2023-05-01 ENCOUNTER — Other Ambulatory Visit (INDEPENDENT_AMBULATORY_CARE_PROVIDER_SITE_OTHER): Payer: Self-pay

## 2023-05-01 ENCOUNTER — Ambulatory Visit: Payer: Commercial Managed Care - PPO | Admitting: Sports Medicine

## 2023-05-01 DIAGNOSIS — S86312A Strain of muscle(s) and tendon(s) of peroneal muscle group at lower leg level, left leg, initial encounter: Secondary | ICD-10-CM

## 2023-05-01 NOTE — Assessment & Plan Note (Addendum)
Pleasant 57 year old female, chronic left lateral pain on the ankle, MRI did show longitudinal split tear peroneus brevis, she improved considerably in a boot for 3 weeks, had some recurrence of pain out of the boot and we put her in for another 3 weeks, she did well but unfortunately is having recurrence. Today we did a peroneal sheath injection, we will also set her up for a consultation with Dr. Victorino Dike, she will see me back in about 6 weeks.

## 2023-05-01 NOTE — Progress Notes (Signed)
    Procedures performed today:    Procedure: Real-time Ultrasound Guided injection of the left peroneal sheath Device: Samsung HS60  Verbal informed consent obtained.  Time-out conducted.  Noted no overlying erythema, induration, or other signs of local infection.  Skin prepped in a sterile fashion.  Local anesthesia: Topical Ethyl chloride.  With sterile technique and under real time ultrasound guidance: split tear noted peroneus brevis, 1 cc Kenalog 40, 1 cc lidocaine, 1 cc bupivacaine injected easily Completed without difficulty  Advised to call if fevers/chills, erythema, induration, drainage, or persistent bleeding.  Images permanently stored and available for review in PACS.  Impression: Technically successful ultrasound guided injection.  Independent interpretation of notes and tests performed by another provider:   None.  Brief History, Exam, Impression, and Recommendations:    Longitudinal split tear left peroneus brevis Pleasant 57 year old female, chronic left lateral pain on the ankle, MRI did show longitudinal split tear peroneus brevis, she improved considerably in a boot for 3 weeks, had some recurrence of pain out of the boot and we put her in for another 3 weeks, she did well but unfortunately is having recurrence. Today we did a peroneal sheath injection, we will also set her up for a consultation with Dr. Victorino Dike, she will see me back in about 6 weeks.    ____________________________________________ Ihor Austin. Benjamin Stain, M.D., ABFM., CAQSM., AME. Primary Care and Sports Medicine [place] [facility]  Adjunct Professor of Family Medicine  [place] of [facility] of Medicine  Restaurant manager, fast food

## 2023-05-29 DIAGNOSIS — M7672 Peroneal tendinitis, left leg: Secondary | ICD-10-CM | POA: Diagnosis not present

## 2023-05-29 DIAGNOSIS — M25572 Pain in left ankle and joints of left foot: Secondary | ICD-10-CM | POA: Diagnosis not present

## 2023-05-29 DIAGNOSIS — M79672 Pain in left foot: Secondary | ICD-10-CM | POA: Diagnosis not present

## 2023-05-29 DIAGNOSIS — M25372 Other instability, left ankle: Secondary | ICD-10-CM | POA: Diagnosis not present

## 2023-06-12 ENCOUNTER — Ambulatory Visit: Payer: Commercial Managed Care - PPO | Admitting: Sports Medicine

## 2023-06-12 DIAGNOSIS — S86312A Strain of muscle(s) and tendon(s) of peroneal muscle group at lower leg level, left leg, initial encounter: Secondary | ICD-10-CM | POA: Diagnosis not present

## 2023-06-12 DIAGNOSIS — Z1231 Encounter for screening mammogram for malignant neoplasm of breast: Secondary | ICD-10-CM | POA: Diagnosis not present

## 2023-06-12 DIAGNOSIS — Z0000 Encounter for general adult medical examination without abnormal findings: Secondary | ICD-10-CM | POA: Diagnosis not present

## 2023-06-12 NOTE — Progress Notes (Signed)
    Procedures performed today:    None.  Independent interpretation of notes and tests performed by another provider:   None.  Brief History, Exam, Impression, and Recommendations:    Annual physical exam Up-to-date on screenings except for mammogram.  Longitudinal split tear left peroneus brevis This is a very pleasant 58 year old female, chronic left lateral ankle pain, MRI did show longitudinal split tear peroneus brevis, we have treated her extensively in a nonoperative fashion including PT, immobilization, medications, activity modification, bracing, peroneal sheath injection, she just had her consultation with Dr. Rosebud Confer who is planning some PT and then peroneal repair. Further management per Dr. Rosebud Confer.    ____________________________________________ Joselyn Nicely. Sandy Crumb, M.D., ABFM., CAQSM., AME. Primary Care and Sports Medicine [place] [facility]  Adjunct Professor of [facility] Medicine  [employer]  Restaurant manager, fast food

## 2023-06-12 NOTE — Assessment & Plan Note (Signed)
 This is a very pleasant 58 year old female, chronic left lateral ankle pain, MRI did show longitudinal split tear peroneus brevis, we have treated her extensively in a nonoperative fashion including PT, immobilization, medications, activity modification, bracing, peroneal sheath injection, she just had her consultation with Dr. Rosebud Confer who is planning some PT and then peroneal repair. Further management per Dr. Rosebud Confer.

## 2023-06-12 NOTE — Assessment & Plan Note (Signed)
 Up-to-date on screenings except for mammogram.

## 2023-06-14 DIAGNOSIS — M7672 Peroneal tendinitis, left leg: Secondary | ICD-10-CM | POA: Diagnosis not present

## 2023-06-26 DIAGNOSIS — M79672 Pain in left foot: Secondary | ICD-10-CM | POA: Diagnosis not present

## 2023-06-28 DIAGNOSIS — M79672 Pain in left foot: Secondary | ICD-10-CM | POA: Diagnosis not present

## 2023-07-03 DIAGNOSIS — M79672 Pain in left foot: Secondary | ICD-10-CM | POA: Diagnosis not present

## 2023-07-07 DIAGNOSIS — M79672 Pain in left foot: Secondary | ICD-10-CM | POA: Diagnosis not present

## 2023-07-10 DIAGNOSIS — M7672 Peroneal tendinitis, left leg: Secondary | ICD-10-CM | POA: Diagnosis not present

## 2023-07-10 DIAGNOSIS — M25372 Other instability, left ankle: Secondary | ICD-10-CM | POA: Diagnosis not present

## 2023-07-12 ENCOUNTER — Other Ambulatory Visit: Payer: Self-pay | Admitting: Orthopedic Surgery

## 2023-07-23 ENCOUNTER — Other Ambulatory Visit: Payer: Self-pay | Admitting: Sports Medicine

## 2023-07-23 DIAGNOSIS — M25551 Pain in right hip: Secondary | ICD-10-CM

## 2023-07-23 DIAGNOSIS — F4321 Adjustment disorder with depressed mood: Secondary | ICD-10-CM

## 2023-07-23 DIAGNOSIS — M7061 Trochanteric bursitis, right hip: Secondary | ICD-10-CM

## 2023-07-24 ENCOUNTER — Other Ambulatory Visit (HOSPITAL_BASED_OUTPATIENT_CLINIC_OR_DEPARTMENT_OTHER): Payer: Self-pay

## 2023-07-24 ENCOUNTER — Other Ambulatory Visit: Payer: Self-pay

## 2023-07-24 MED ORDER — TRAMADOL HCL 50 MG PO TABS
50.0000 mg | ORAL_TABLET | Freq: Three times a day (TID) | ORAL | 0 refills | Status: DC | PRN
  Filled 2023-07-24: qty 90, 30d supply, fill #0

## 2023-07-24 MED ORDER — QUETIAPINE FUMARATE 50 MG PO TABS
50.0000 mg | ORAL_TABLET | Freq: Every day | ORAL | 2 refills | Status: DC
  Filled 2023-07-24 (×2): qty 90, 90d supply, fill #0
  Filled 2023-10-18: qty 90, 90d supply, fill #1
  Filled 2024-01-25: qty 90, 90d supply, fill #2

## 2023-08-07 ENCOUNTER — Encounter (INDEPENDENT_AMBULATORY_CARE_PROVIDER_SITE_OTHER): Payer: Self-pay | Admitting: Sports Medicine

## 2023-08-07 DIAGNOSIS — F40243 Fear of flying: Secondary | ICD-10-CM

## 2023-08-08 ENCOUNTER — Other Ambulatory Visit (HOSPITAL_BASED_OUTPATIENT_CLINIC_OR_DEPARTMENT_OTHER): Payer: Self-pay

## 2023-08-08 MED ORDER — DIAZEPAM 5 MG PO TABS
5.0000 mg | ORAL_TABLET | Freq: Every day | ORAL | 0 refills | Status: DC
  Filled 2023-08-08: qty 5, 5d supply, fill #0

## 2023-08-08 NOTE — Telephone Encounter (Signed)

## 2023-08-08 NOTE — Assessment & Plan Note (Signed)
 Starting Valium

## 2023-08-10 ENCOUNTER — Encounter (HOSPITAL_BASED_OUTPATIENT_CLINIC_OR_DEPARTMENT_OTHER): Payer: Self-pay | Admitting: Orthopedic Surgery

## 2023-08-10 ENCOUNTER — Other Ambulatory Visit: Payer: Self-pay

## 2023-08-16 ENCOUNTER — Encounter (HOSPITAL_BASED_OUTPATIENT_CLINIC_OR_DEPARTMENT_OTHER)
Admission: RE | Admit: 2023-08-16 | Discharge: 2023-08-16 | Disposition: A | Discharge status: Home / Self Care | Source: Ambulatory Visit | Attending: Orthopedic Surgery | Admitting: Orthopedic Surgery

## 2023-08-16 DIAGNOSIS — M7672 Peroneal tendinitis, left leg: Secondary | ICD-10-CM | POA: Diagnosis present

## 2023-08-16 DIAGNOSIS — S86312A Strain of muscle(s) and tendon(s) of peroneal muscle group at lower leg level, left leg, initial encounter: Secondary | ICD-10-CM | POA: Diagnosis not present

## 2023-08-16 DIAGNOSIS — M25372 Other instability, left ankle: Secondary | ICD-10-CM | POA: Diagnosis not present

## 2023-08-16 DIAGNOSIS — X58XXXA Exposure to other specified factors, initial encounter: Secondary | ICD-10-CM | POA: Diagnosis not present

## 2023-08-16 DIAGNOSIS — M65962 Unspecified synovitis and tenosynovitis, left lower leg: Secondary | ICD-10-CM | POA: Diagnosis not present

## 2023-08-16 NOTE — Progress Notes (Signed)

## 2023-08-16 NOTE — H&P (Cosign Needed)
 [name] [name] is an 58 y.o. female.   Chief Complaint: Left ankle pain and instability HPI: Patient is a 58 year old female that presents to the OR today for definitive treatment of of her chronic left ankle pain and instability. She has failed nonoperative treatment including activity modification, oral anti-inflammatories, physical therapy and bracing as well as multiple steroid injections and elects for surgical intervention.    Allergies:  Allergies  Allergen Reactions   Bee Venom Dermatitis and Other (See Comments)    Cellulitis/staph infections following bee stings   Sulfonamide Derivatives Rash    Blisters in mucous membranes    Past Medical History:  Diagnosis Date   Asthma    Essential hypertension 01/14/2015   GERD (gastroesophageal reflux disease)    Hydronephrosis, right    chronic   IBS (irritable bowel syndrome)    Incarcerated epigastric hernia 01/10/2017   Murmur     Past Surgical History:  Procedure Laterality Date   HAND SURGERY     LT 2002   INSERTION OF MESH N/A 01/10/2017   Procedure: INSERTION OF MESH;  Surgeon: Claud Kelp, MD;  Location: [facility] OR;  Service: General;  Laterality: N/A;   PYELOPLASTY     RT kidney 1993   pyleoplasty     right kidney   TONSILLECTOMY     VENTRAL HERNIA REPAIR N/A 01/10/2017   Procedure: LAPAROSCOPIC REPAIR EPIGASTRIC HERNIA WITH MESH;  Surgeon: Claud Kelp, MD;  Location: [facility] OR;  Service: General;  Laterality: N/A;    Family History: Family History  Problem Relation Age of Onset   Alcohol abuse Father    Depression Father    Stroke Father    Hypertension Father    Heart attack Father    Esophageal cancer Father    Breast cancer Mother        metastatic to lungs   Heart disease Mother    Hypertension Mother    Cancer Mother        breast and lung   Heart attack Mother    Mental illness Paternal Aunt     Social History:   reports that she has never smoked. She has never used smokeless tobacco. She  reports current alcohol use. She reports that she does not use drugs.  Medications: No medications prior to admission.    No results found for this or any previous visit (from the past 48 hours).  No results found.    Height 5\' 1"  (1.549 m), weight 68 kg, last menstrual period 11/09/2018.  PE:  well nourished and well developed.  NAD.  EOMI.  Resp unlabored.  Tender to palpation localized to left lateral ankle and hindfoot. Positive anterior drawer.  Assessment/Plan Left peroneal tendinopathy and ankle instability  The patient presents to the OR for treatment of her left peroneal tendinopathy and ankle instability.  She will require left peroneal tendon debridement versus repair and lateral ligament reconstruction.  After reviewing the procedure, postoperative protocol, and risk of surgery the patient elects for surgical intervention.  The patient specifically understands risks of bleeding, infection, nerve damage, blood clots, need for additional surgery, continued pain, nonunion, post traumatic arthritis, recurrence of deformity, amputation and death.   Alfredo Martinez PA-C [facility] Office:  [phone]   No changes to findings documented above.  To the OR today for left peroneal tendon debridement and / or repair and lateral ligament reconstruction. The risks and benefits of the alternative treatment options have been discussed in detail.  The patient wishes to  proceed with surgery and specifically understands risks of bleeding, infection, nerve damage, blood clots, need for additional surgery, amputation and death.

## 2023-08-16 NOTE — Anesthesia Preprocedure Evaluation (Addendum)
 Anesthesia Evaluation  Patient identified by MRN, date of birth, ID band Patient awake    Reviewed: Allergy & Precautions, NPO status , Patient's Chart, lab work & pertinent test results  History of Anesthesia Complications Negative for: history of anesthetic complications  Airway Mallampati: II  TM Distance: >3 FB Neck ROM: Full    Dental  (+) Teeth Intact, Dental Advisory Given   Pulmonary asthma    breath sounds clear to auscultation       Cardiovascular hypertension, Pt. on medications  Rhythm:Regular Rate:Normal     Neuro/Psych negative neurological ROS     GI/Hepatic ,GERD  Medicated and Controlled,,  Endo/Other    Renal/GU      Musculoskeletal   Abdominal   Peds  Hematology   Anesthesia Other Findings   Reproductive/Obstetrics                             Anesthesia Physical Anesthesia Plan  ASA: 3  Anesthesia Plan: General and Regional   Post-op Pain Management: Tylenol PO (pre-op)*, Celebrex PO (pre-op)* and Minimal or no pain anticipated   Induction: Intravenous  PONV Risk Score and Plan: 4 or greater and Ondansetron, Dexamethasone, Midazolam, Scopolamine patch - Pre-op, Treatment may vary due to age or medical condition and TIVA  Airway Management Planned: LMA  Additional Equipment: None  Intra-op Plan:   Post-operative Plan: Extubation in OR  Informed Consent: I have reviewed the patients History and Physical, chart, labs and discussed the procedure including the risks, benefits and alternatives for the proposed anesthesia with the patient or authorized representative who has indicated his/her understanding and acceptance.     Dental advisory given  Plan Discussed with: CRNA and Anesthesiologist  Anesthesia Plan Comments:         Anesthesia Quick Evaluation

## 2023-08-17 ENCOUNTER — Other Ambulatory Visit (HOSPITAL_COMMUNITY): Payer: Self-pay

## 2023-08-17 ENCOUNTER — Encounter (HOSPITAL_BASED_OUTPATIENT_CLINIC_OR_DEPARTMENT_OTHER): Payer: Self-pay | Admitting: Orthopedic Surgery

## 2023-08-17 ENCOUNTER — Ambulatory Visit (HOSPITAL_BASED_OUTPATIENT_CLINIC_OR_DEPARTMENT_OTHER): Admitting: Anesthesiology

## 2023-08-17 ENCOUNTER — Other Ambulatory Visit: Payer: Self-pay

## 2023-08-17 ENCOUNTER — Ambulatory Visit (HOSPITAL_BASED_OUTPATIENT_CLINIC_OR_DEPARTMENT_OTHER)
Admission: RE | Admit: 2023-08-17 | Discharge: 2023-08-17 | Disposition: A | Discharge status: Home / Self Care | Source: Ambulatory Visit | Attending: Orthopedic Surgery | Admitting: Orthopedic Surgery

## 2023-08-17 ENCOUNTER — Encounter (HOSPITAL_BASED_OUTPATIENT_CLINIC_OR_DEPARTMENT_OTHER)
Admission: RE | Disposition: A | Discharge status: Home / Self Care | Payer: Self-pay | Source: Ambulatory Visit | Attending: Orthopedic Surgery

## 2023-08-17 DIAGNOSIS — M65962 Unspecified synovitis and tenosynovitis, left lower leg: Secondary | ICD-10-CM | POA: Insufficient documentation

## 2023-08-17 DIAGNOSIS — M7672 Peroneal tendinitis, left leg: Secondary | ICD-10-CM

## 2023-08-17 DIAGNOSIS — G8918 Other acute postprocedural pain: Secondary | ICD-10-CM | POA: Diagnosis not present

## 2023-08-17 DIAGNOSIS — S86312A Strain of muscle(s) and tendon(s) of peroneal muscle group at lower leg level, left leg, initial encounter: Secondary | ICD-10-CM | POA: Insufficient documentation

## 2023-08-17 DIAGNOSIS — M25372 Other instability, left ankle: Secondary | ICD-10-CM | POA: Insufficient documentation

## 2023-08-17 DIAGNOSIS — J45909 Unspecified asthma, uncomplicated: Secondary | ICD-10-CM

## 2023-08-17 DIAGNOSIS — X58XXXA Exposure to other specified factors, initial encounter: Secondary | ICD-10-CM | POA: Insufficient documentation

## 2023-08-17 DIAGNOSIS — I10 Essential (primary) hypertension: Secondary | ICD-10-CM | POA: Diagnosis not present

## 2023-08-17 DIAGNOSIS — Z01818 Encounter for other preprocedural examination: Secondary | ICD-10-CM

## 2023-08-17 HISTORY — PX: FLEXOR TENDON REPAIR: SHX6501

## 2023-08-17 HISTORY — PX: LIGAMENT REPAIR: SHX5444

## 2023-08-17 SURGERY — REPAIR, TENDON, FLEXOR
Anesthesia: Regional | Site: Foot | Laterality: Left

## 2023-08-17 MED ORDER — BUPIVACAINE LIPOSOME 1.3 % IJ SUSP
INTRAMUSCULAR | Status: DC | PRN
  Administered 2023-08-17: 10 mL via PERINEURAL

## 2023-08-17 MED ORDER — PROPOFOL 500 MG/50ML IV EMUL
INTRAVENOUS | Status: AC
  Filled 2023-08-17: qty 50

## 2023-08-17 MED ORDER — OXYCODONE HCL 5 MG/5ML PO SOLN: 5.0000 mg | Freq: Once | ORAL | Status: DC | PRN

## 2023-08-17 MED ORDER — CEFAZOLIN SODIUM-DEXTROSE 2-4 GM/100ML-% IV SOLN
2.0000 g | INTRAVENOUS | Status: AC
  Administered 2023-08-17: 2 g via INTRAVENOUS

## 2023-08-17 MED ORDER — ACETAMINOPHEN 500 MG PO TABS
ORAL_TABLET | ORAL | Status: AC
  Filled 2023-08-17: qty 2

## 2023-08-17 MED ORDER — FENTANYL CITRATE (PF) 100 MCG/2ML IJ SOLN: 25.0000 ug | INTRAMUSCULAR | Status: DC | PRN

## 2023-08-17 MED ORDER — SENNA 8.6 MG PO TABS
2.0000 | ORAL_TABLET | Freq: Two times a day (BID) | ORAL | 0 refills | Status: DC
  Filled 2023-08-17: qty 30, 8d supply, fill #0

## 2023-08-17 MED ORDER — OXYCODONE HCL 5 MG PO TABS
5.0000 mg | ORAL_TABLET | Freq: Four times a day (QID) | ORAL | 0 refills | Status: AC | PRN
  Filled 2023-08-17: qty 20, 5d supply, fill #0

## 2023-08-17 MED ORDER — ONDANSETRON HCL 4 MG/2ML IJ SOLN: 4.0000 mg | Freq: Once | INTRAMUSCULAR | Status: DC | PRN

## 2023-08-17 MED ORDER — PROPOFOL 10 MG/ML IV BOLUS
INTRAVENOUS | Status: DC | PRN
  Administered 2023-08-17: 120 mg via INTRAVENOUS

## 2023-08-17 MED ORDER — DOCUSATE SODIUM 100 MG PO CAPS
100.0000 mg | ORAL_CAPSULE | Freq: Two times a day (BID) | ORAL | 0 refills | Status: DC
  Filled 2023-08-17: qty 30, 15d supply, fill #0

## 2023-08-17 MED ORDER — PHENYLEPHRINE HCL (PRESSORS) 10 MG/ML IV SOLN
INTRAVENOUS | Status: DC | PRN
  Administered 2023-08-17 (×4): 80 ug via INTRAVENOUS

## 2023-08-17 MED ORDER — FENTANYL CITRATE (PF) 100 MCG/2ML IJ SOLN
INTRAMUSCULAR | Status: AC
  Filled 2023-08-17: qty 2

## 2023-08-17 MED ORDER — LIDOCAINE 2% (20 MG/ML) 5 ML SYRINGE
INTRAMUSCULAR | Status: AC
Start: 2023-08-17 — End: ?
  Filled 2023-08-17: qty 5

## 2023-08-17 MED ORDER — ONDANSETRON HCL 4 MG/2ML IJ SOLN
INTRAMUSCULAR | Status: AC
Start: 2023-08-17 — End: ?
  Filled 2023-08-17: qty 2

## 2023-08-17 MED ORDER — DEXAMETHASONE SODIUM PHOSPHATE 10 MG/ML IJ SOLN
INTRAMUSCULAR | Status: AC
  Filled 2023-08-17: qty 1

## 2023-08-17 MED ORDER — LIDOCAINE HCL (CARDIAC) PF 100 MG/5ML IV SOSY
PREFILLED_SYRINGE | INTRAVENOUS | Status: DC | PRN
  Administered 2023-08-17: 40 mg via INTRAVENOUS

## 2023-08-17 MED ORDER — CELECOXIB 200 MG PO CAPS
200.0000 mg | ORAL_CAPSULE | Freq: Once | ORAL | Status: AC
Start: 2023-08-17 — End: 2023-08-17
  Administered 2023-08-17: 200 mg via ORAL

## 2023-08-17 MED ORDER — FENTANYL CITRATE (PF) 100 MCG/2ML IJ SOLN
100.0000 ug | Freq: Once | INTRAMUSCULAR | Status: AC
  Administered 2023-08-17: 100 ug via INTRAVENOUS

## 2023-08-17 MED ORDER — CEFAZOLIN SODIUM-DEXTROSE 2-4 GM/100ML-% IV SOLN
INTRAVENOUS | Status: AC
  Filled 2023-08-17: qty 100

## 2023-08-17 MED ORDER — LACTATED RINGERS IV SOLN: INTRAVENOUS | Status: DC

## 2023-08-17 MED ORDER — MIDAZOLAM HCL 2 MG/2ML IJ SOLN
2.0000 mg | Freq: Once | INTRAMUSCULAR | Status: AC
  Administered 2023-08-17: 2 mg via INTRAVENOUS

## 2023-08-17 MED ORDER — DEXAMETHASONE SODIUM PHOSPHATE 10 MG/ML IJ SOLN
INTRAMUSCULAR | Status: DC | PRN
  Administered 2023-08-17: 10 mg via INTRAVENOUS

## 2023-08-17 MED ORDER — MIDAZOLAM HCL 2 MG/2ML IJ SOLN
INTRAMUSCULAR | Status: AC
Start: 2023-08-17 — End: ?
  Filled 2023-08-17: qty 2

## 2023-08-17 MED ORDER — ONDANSETRON HCL 4 MG/2ML IJ SOLN
INTRAMUSCULAR | Status: DC | PRN
  Administered 2023-08-17: 4 mg via INTRAVENOUS

## 2023-08-17 MED ORDER — OXYCODONE HCL 5 MG PO TABS: 5.0000 mg | ORAL_TABLET | Freq: Once | ORAL | Status: DC | PRN

## 2023-08-17 MED ORDER — ASPIRIN 81 MG PO TBEC
81.0000 mg | DELAYED_RELEASE_TABLET | Freq: Two times a day (BID) | ORAL | 0 refills | Status: DC
  Filled 2023-08-17: qty 28, 14d supply, fill #0

## 2023-08-17 MED ORDER — ACETAMINOPHEN 500 MG PO TABS
1000.0000 mg | ORAL_TABLET | Freq: Once | ORAL | Status: AC
  Administered 2023-08-17: 1000 mg via ORAL

## 2023-08-17 MED ORDER — VANCOMYCIN HCL 500 MG IV SOLR
INTRAVENOUS | Status: DC | PRN
  Administered 2023-08-17: 500 mg via TOPICAL

## 2023-08-17 MED ORDER — CELECOXIB 200 MG PO CAPS
ORAL_CAPSULE | ORAL | Status: AC
  Filled 2023-08-17: qty 1

## 2023-08-17 MED ORDER — MEPERIDINE HCL 25 MG/ML IJ SOLN: 6.2500 mg | INTRAMUSCULAR | Status: DC | PRN

## 2023-08-17 MED ORDER — EPHEDRINE SULFATE (PRESSORS) 50 MG/ML IJ SOLN
INTRAMUSCULAR | Status: DC | PRN
Start: 2023-08-17 — End: 2023-08-17
  Administered 2023-08-17 (×2): 5 mg via INTRAVENOUS

## 2023-08-17 MED ORDER — VANCOMYCIN HCL 500 MG IV SOLR
INTRAVENOUS | Status: AC
  Filled 2023-08-17: qty 20

## 2023-08-17 MED ORDER — BUPIVACAINE HCL (PF) 0.5 % IJ SOLN
INTRAMUSCULAR | Status: DC | PRN
  Administered 2023-08-17: 10 mL via PERINEURAL

## 2023-08-17 MED ORDER — 0.9 % SODIUM CHLORIDE (POUR BTL) OPTIME
TOPICAL | Status: DC | PRN
  Administered 2023-08-17: 200 mL

## 2023-08-17 MED ORDER — ROPIVACAINE HCL 5 MG/ML IJ SOLN
INTRAMUSCULAR | Status: DC | PRN
  Administered 2023-08-17: 15 mL via PERINEURAL

## 2023-08-17 SURGICAL SUPPLY — 57 items
ANCHOR SUT 1.45 SZ 1 SHORT (Anchor) IMPLANT
BANDAGE ESMARK 6X9 LF (GAUZE/BANDAGES/DRESSINGS) IMPLANT
BLADE SURG 15 STRL LF DISP TIS (BLADE) ×4 IMPLANT
BNDG ELASTIC 4INX 5YD STR LF (GAUZE/BANDAGES/DRESSINGS) ×2 IMPLANT
BNDG ELASTIC 6INX 5YD STR LF (GAUZE/BANDAGES/DRESSINGS) IMPLANT
BNDG ESMARK 6X9 LF (GAUZE/BANDAGES/DRESSINGS) IMPLANT
CANISTER SUCT 1200ML W/VALVE (MISCELLANEOUS) ×2 IMPLANT
CHLORAPREP W/TINT 26 (MISCELLANEOUS) ×1 IMPLANT
COVER BACK TABLE 60X90IN (DRAPES) ×2 IMPLANT
CUFF TRNQT CYL 34X4.125X (TOURNIQUET CUFF) ×1 IMPLANT
DRAPE EXTREMITY T 121X128X90 (DISPOSABLE) ×1 IMPLANT
DRAPE OEC MINIVIEW 54X84 (DRAPES) IMPLANT
DRAPE U-SHAPE 47X51 STRL (DRAPES) ×2 IMPLANT
DRSG MEPITEL 4X7.2 (GAUZE/BANDAGES/DRESSINGS) ×2 IMPLANT
ELECT REM PT RETURN 9FT ADLT (ELECTROSURGICAL) ×1 IMPLANT
ELECTRODE REM PT RTRN 9FT ADLT (ELECTROSURGICAL) ×2 IMPLANT
GAUZE PAD ABD 8X10 STRL (GAUZE/BANDAGES/DRESSINGS) ×1 IMPLANT
GAUZE SPONGE 4X4 12PLY STRL (GAUZE/BANDAGES/DRESSINGS) ×1 IMPLANT
GLOVE BIO SURGEON STRL SZ8 (GLOVE) ×1 IMPLANT
GLOVE BIOGEL PI IND STRL 8 (GLOVE) ×4 IMPLANT
GLOVE ECLIPSE 8.0 STRL XLNG CF (GLOVE) ×2 IMPLANT
GOWN STRL REUS W/ TWL LRG LVL3 (GOWN DISPOSABLE) ×1 IMPLANT
GOWN STRL REUS W/ TWL XL LVL3 (GOWN DISPOSABLE) ×4 IMPLANT
K-WIRE DBL .062X4 NSTRL (WIRE) IMPLANT
KWIRE DBL .062X4 NSTRL (WIRE) IMPLANT
NDL HYPO 22X1.5 SAFETY MO (MISCELLANEOUS) IMPLANT
NDL SUT 6 .5 CRC .975X.05 MAYO (NEEDLE) IMPLANT
NEEDLE HYPO 22X1.5 SAFETY MO (MISCELLANEOUS) IMPLANT
NS IRRIG 1000ML POUR BTL (IV SOLUTION) ×1 IMPLANT
PACK BASIN DAY SURGERY FS (CUSTOM PROCEDURE TRAY) ×2 IMPLANT
PAD CAST 4YDX4 CTTN HI CHSV (CAST SUPPLIES) ×2 IMPLANT
PADDING CAST ABS COTTON 4X4 ST (CAST SUPPLIES) IMPLANT
PADDING CAST COTTON 6X4 STRL (CAST SUPPLIES) IMPLANT
PASSER SUT SWANSON 36MM LOOP (INSTRUMENTS) IMPLANT
PENCIL SMOKE EVACUATOR (MISCELLANEOUS) ×1 IMPLANT
RETRIEVER SUT HEWSON (MISCELLANEOUS) IMPLANT
SANITIZER HAND PURELL FF 515ML (MISCELLANEOUS) ×2 IMPLANT
SHEET MEDIUM DRAPE 40X70 STRL (DRAPES) ×2 IMPLANT
SLEEVE SCD COMPRESS KNEE MED (STOCKING) ×2 IMPLANT
SPIKE FLUID TRANSFER (MISCELLANEOUS) IMPLANT
SPLINT PLASTER CAST FAST 5X30 (CAST SUPPLIES) IMPLANT
SPONGE T-LAP 18X18 ~~LOC~~+RFID (SPONGE) ×1 IMPLANT
STOCKINETTE 6 STRL (DRAPES) ×2 IMPLANT
SUCTION TUBE FRAZIER 10FR DISP (SUCTIONS) ×2 IMPLANT
SUT ETHIBOND 0 MO6 C/R (SUTURE) IMPLANT
SUT ETHIBOND 2 OS 4 DA (SUTURE) IMPLANT
SUT ETHILON 3 0 PS 1 (SUTURE) ×2 IMPLANT
SUT FIBERWIRE 2-0 18 17.9 3/8 (SUTURE) IMPLANT
SUT MNCRL AB 3-0 PS2 18 (SUTURE) ×2 IMPLANT
SUT VIC AB 2-0 SH 27XBRD (SUTURE) IMPLANT
SUT VICRYL 0 SH 27 (SUTURE) IMPLANT
SUT VICRYL 0 UR6 27IN ABS (SUTURE) IMPLANT
SUTURE FIBERWR 2-0 18 17.9 3/8 (SUTURE) IMPLANT
SYR BULB EAR ULCER 3OZ GRN STR (SYRINGE) ×2 IMPLANT
TOWEL GREEN STERILE FF (TOWEL DISPOSABLE) ×2 IMPLANT
TUBE CONNECTING 20X1/4 (TUBING) ×2 IMPLANT
UNDERPAD 30X36 HEAVY ABSORB (UNDERPADS AND DIAPERS) ×2 IMPLANT

## 2023-08-17 NOTE — Progress Notes (Signed)
 Assisted Dr. Tacy Dura with left, adductor canal, popliteal, ultrasound guided block. Side rails up, monitors on throughout procedure. See vital signs in flow sheet. Tolerated Procedure well.

## 2023-08-17 NOTE — Anesthesia Procedure Notes (Signed)
 Anesthesia Regional Block: Popliteal block   Pre-Anesthetic Checklist: , timeout performed,  Correct Patient, Correct Site, Correct Laterality,  Correct Procedure, Correct Position, site marked,  Risks and benefits discussed,  Surgical consent,  Pre-op evaluation,  At surgeon's request and post-op pain management  Laterality: Left  Prep: chloraprep       Needles:  Injection technique: Single-shot  Needle Type: Echogenic Stimulator Needle     Needle Length: 5cm  Needle Gauge: 22     Additional Needles:   Procedures:, nerve stimulator,,, ultrasound used (permanent image in chart),,     Nerve Stimulator or Paresthesia:  Response: foot, 0.45 mA  Additional Responses:   Narrative:  Start time: 08/17/2023 7:00 AM End time: 08/17/2023 7:03 AM Injection made incrementally with aspirations every 5 mL.  Performed by: Personally  Anesthesiologist: Rhenda Cedars, MD  Additional Notes: Functioning IV was confirmed and monitors were applied.  A 50mm 22ga Arrow echogenic stimulator needle was used. Sterile prep and drape,hand hygiene and sterile gloves were used. Ultrasound guidance: relevant anatomy identified, needle position confirmed, local anesthetic spread visualized around nerve(s)., vascular puncture avoided.  Image printed for medical record. Negative aspiration and negative test dose prior to incremental administration of local anesthetic. The patient tolerated the procedure well.

## 2023-08-17 NOTE — Transfer of Care (Signed)
 Immediate Anesthesia Transfer of Care Note  Patient: [name] [name]  Procedure(s) Performed: REPAIR, TENDON, FLEXOR (Left: Foot) REPAIR, LIGAMENT (Left: Foot)  Patient Location: PACU  Anesthesia Type:General  Level of Consciousness: sedated  Airway & Oxygen Therapy: Patient Spontanous Breathing and Patient connected to face mask oxygen  Post-op Assessment: Report given to RN and Post -op Vital signs reviewed and stable  Post vital signs: Reviewed and stable  Last Vitals:  Vitals Value Taken Time  BP 99/58 08/17/23 0835  Temp 36.3 C 08/17/23 0835  Pulse 82 08/17/23 0841  Resp 21 08/17/23 0841  SpO2 98 % 08/17/23 0841  Vitals shown include unfiled device data.  Last Pain:  Vitals:   08/17/23 0622  TempSrc: Temporal  PainSc: 4       Patients Stated Pain Goal: 4 (08/17/23 0622)  Complications: No notable events documented.

## 2023-08-17 NOTE — Anesthesia Procedure Notes (Signed)
 Procedure Name: LMA Insertion Date/Time: 08/17/2023 7:28 AM  Performed by: Arvilla Birmingham, CRNAPre-anesthesia Checklist: Patient identified, Emergency Drugs available, Suction available and Patient being monitored Patient Re-evaluated:Patient Re-evaluated prior to induction Oxygen Delivery Method: Circle system utilized Preoxygenation: Pre-oxygenation with 100% oxygen Induction Type: IV induction Ventilation: Mask ventilation without difficulty LMA: LMA inserted LMA Size: 4.0 Number of attempts: 1 Airway Equipment and Method: Bite block Placement Confirmation: positive ETCO2 Tube secured with: Tape Dental Injury: Teeth and Oropharynx as per pre-operative assessment

## 2023-08-17 NOTE — Op Note (Signed)
 08/17/2023  8:27 AM  PATIENT:  [name] [name]  58 y.o. female  PRE-OPERATIVE DIAGNOSIS: 1.  Left peroneus brevis tendon tear 2.  Left peroneus longus tenosynovitis 3.  Left ankle chronic lateral instability  POST-OPERATIVE DIAGNOSIS: Same  Procedure(s): 1.  Left peroneus longus tenolysis 2.  Left peroneus brevis tendon repair 3.  Left ankle lateral ligament reconstruction  SURGEON:  Toni Arthurs, MD  ASSISTANT: Alfredo Martinez, PA-C  ANESTHESIA:   General, regional  EBL:  minimal   TOURNIQUET:   Total Tourniquet Time Documented: Thigh (Left) - 45 minutes Total: Thigh (Left) - 45 minutes  COMPLICATIONS:  None apparent  DISPOSITION:  Extubated, awake and stable to recovery.  INDICATION FOR PROCEDURE: 58 year old female with a history of worsening left ankle pain due to lateral ligament instability and a peroneal tendon tear.  She has failed nonoperative treatment and presents today for surgery.  The risks and benefits of the alternative treatment options have been discussed in detail.  The patient wishes to proceed with surgery and specifically understands risks of bleeding, infection, nerve damage, blood clots, need for additional surgery, amputation and death.   PROCEDURE IN DETAIL:  After pre operative consent was obtained, and the correct operative site was identified, the patient was brought to the operating room and placed supine on the OR table.  Anesthesia was administered.  Pre-operative antibiotics were administered.  A surgical timeout was taken.  The left lower extremity was prepped and draped in standard sterile fashion with a tourniquet around the thigh.  The extremity was elevated, and the tourniquet was inflated to 250 mmHg.  An incision was made over the peroneal tendons.  Dissection was carried sharply down through the subcutaneous tissues.  The peroneal tendon sheath was incised distal to the fibula.  It was released back to the superior peroneal retinaculum.  The  peroneus brevis tendon was examined.  It was noted to have a longitudinal split tear extending to the retrofibular groove.  The incision was extended proximally and distally allowing complete exposure of the torn tendon.  The superior peroneal retinaculum was taken down from the fibula in subperiosteal fashion.  The peroneus longus tendon was carefully examined.  There was no evident tear.  There was significant tenosynovitis.  This was all sharply excised with scissors.  The brevis tendon tear was debrided to help the tendon.  The tendon was then repaired with running 2-0 Vicryl at the leading and trailing edges of the tendon tear.  Both tendons were reduced to the retrofibular groove and were noted to move appropriately.  The wound was irrigated copiously.  The superior peroneal retinaculum was repaired with imbricating sutures of 0 Vicryl placed through bone.  Attention was then turned to the lateral ligaments.  The origin of the ATFL and CFL were released from the fibula in subperiosteal fashion.  The distal portion of the fibula was then decorticated with a rondure.  2 #1 short rigid juggernaut anchors were placed in the fibula after predrilling.  1 was placed at the origin of the ATFL and the other at the origin of the CFL.  The proximal anchor sutures were passed through the ATFL tissue distally.  The 2 distal sutures were passed through the CFL origin distally.  The ankle was dorsiflexed to neutral externally rotated and everted through the hindfoot.  The sutures were tied advancing the ATFL and CFL onto the fibula.  The sutures were then passed through the extensor retinaculum.  The sutures were tied pulling  the retinaculum over the ATFL and CFL.  The sutures were then passed to the periosteum which was advanced over the ligament repair.  The sutures were tied securely.  The ankle was noted to have a grade 0 anterior drawer in neutral and in plantarflexion.  The wound was irrigated copiously and sprinkled  with vancomycin powder..  Subcutaneous tissues were approximated with 3-0 Monocryl.  The skin incision was closed with horizontal mattress sutures of 3-0 nylon.  Sterile dressings were applied followed by a well-padded short leg splint.  The tourniquet was released after application of the dressings.  The patient was awakened from anesthesia and transported to the recovery room in stable condition.   FOLLOW UP PLAN: Nonweightbearing on the left lower extremity.  Follow-up in the office in 2 weeks for suture removal and conversion to a cam boot to initiate weightbearing and active plantarflexion and dorsiflexion range of motion.  Aspirin for DVT prophylaxis.    Justin Ollis PA-C was present and scrubbed for the duration of the operative case. His assistance was essential in positioning the patient, prepping and draping, gaining and maintaining exposure, performing the operation, closing and dressing the wounds and applying the splint.

## 2023-08-17 NOTE — Discharge Instructions (Addendum)
 [name] Backer, MD [facility]  Please read the following information regarding your care after surgery.  Medications  You only need a prescription for the narcotic pain medicine (ex. oxycodone, Percocet, Norco).  All of the other medicines listed below are available over the counter. ? Aleve 2 pills twice a day for the first 3 days after surgery. ? acetominophen (Tylenol) 650 mg every 4-6 hours as you need for minor to moderate pain ? oxycodone as prescribed for severe pain  Narcotic pain medicine (ex. oxycodone, Percocet, Vicodin) will cause constipation.  To prevent this problem, take the following medicines while you are taking any pain medicine. ? docusate sodium (Colace) 100 mg twice a day ? senna (Senokot) 2 tablets twice a day  ? To help prevent blood clots, take a baby aspirin (81 mg) twice a day for two weeks after surgery.  You should also get up every hour while you are awake to move around.    Weight Bearing ? Do not bear any weight on the operated leg or foot.  Cast / Splint / Dressing ? Keep your splint, cast or dressing clean and dry.  Don't put anything (coat hanger, pencil, etc) down inside of it.  If it gets damp, use a hair dryer on the cool setting to dry it.  If it gets soaked, call the office to schedule an appointment for a cast change.   After your dressing, cast or splint is removed; you may shower, but do not soak or scrub the wound.  Allow the water to run over it, and then gently pat it dry.  Swelling It is normal for you to have swelling where you had surgery.  To reduce swelling and pain, keep your toes above your nose for at least 3 days after surgery.  It may be necessary to keep your foot or leg elevated for several weeks.  If it hurts, it should be elevated.  Follow Up Call my office at [phone] when you are discharged from the hospital or surgery center to schedule an appointment to be seen two weeks after surgery.  Call my office at [phone] if  you develop a fever >101.5 F, nausea, vomiting, bleeding from the surgical site or severe pain.       Post Anesthesia Home Care Instructions  Activity: Get plenty of rest for the remainder of the day. A responsible individual must stay with you for 24 hours following the procedure.  For the next 24 hours, DO NOT: -Drive a car -Advertising copywriter -Drink alcoholic beverages -Take any medication unless instructed by your physician -Make any legal decisions or sign important papers.  Meals: Start with liquid foods such as gelatin or soup. Progress to regular foods as tolerated. Avoid greasy, spicy, heavy foods. If nausea and/or vomiting occur, drink only clear liquids until the nausea and/or vomiting subsides. Call your physician if vomiting continues.  Special Instructions/Symptoms: Your throat may feel dry or sore from the anesthesia or the breathing tube placed in your throat during surgery. If this causes discomfort, gargle with warm salt water. The discomfort should disappear within 24 hours.  If you had a scopolamine patch placed behind your ear for the management of post- operative nausea and/or vomiting:  1. The medication in the patch is effective for 72 hours, after which it should be removed.  Wrap patch in a tissue and discard in the trash. Wash hands thoroughly with soap and water. 2. You may remove the patch earlier than 72 hours if  you experience unpleasant side effects which may include dry mouth, dizziness or visual disturbances. 3. Avoid touching the patch. Wash your hands with soap and water after contact with the patch.     Regional Anesthesia Blocks  1. You may not be able to move or feel the "blocked" extremity after a regional anesthetic block. This may last may last from 3-48 hours after placement, but it will go away. The length of time depends on the medication injected and your individual response to the medication. As the nerves start to wake up, you may  experience tingling as the movement and feeling returns to your extremity. If the numbness and inability to move your extremity has not gone away after 48 hours, please call your surgeon.   2. The extremity that is blocked will need to be protected until the numbness is gone and the strength has returned. Because you cannot feel it, you will need to take extra care to avoid injury. Because it may be weak, you may have difficulty moving it or using it. You may not know what position it is in without looking at it while the block is in effect.  3. For blocks in the legs and feet, returning to weight bearing and walking needs to be done carefully. You will need to wait until the numbness is entirely gone and the strength has returned. You should be able to move your leg and foot normally before you try and bear weight or walk. You will need someone to be with you when you first try to ensure you do not fall and possibly risk injury.  4. Bruising and tenderness at the needle site are common side effects and will resolve in a few days.  5. Persistent numbness or new problems with movement should be communicated to the surgeon or the [facility] [phone] [facility] [phone]).   Next dose of Tylenol may be given at 12:30pm if needed. Next dose of NSAIDs (Ibuprofen/Motrin/Aleve) may be given at 2:30pm if needed.

## 2023-08-17 NOTE — Anesthesia Procedure Notes (Signed)
 Anesthesia Regional Block: Adductor canal block   Pre-Anesthetic Checklist: , timeout performed,  Correct Patient, Correct Site, Correct Laterality,  Correct Procedure, Correct Position, site marked,  Risks and benefits discussed,  Surgical consent,  Pre-op evaluation,  At surgeon's request and post-op pain management  Laterality: Left  Prep: chloraprep       Needles:  Injection technique: Single-shot  Needle Type: Echogenic Stimulator Needle     Needle Length: 5cm  Needle Gauge: 22     Additional Needles:   Procedures:,,,, ultrasound used (permanent image in chart),,    Narrative:  Start time: 08/17/2023 7:05 AM End time: 08/17/2023 7:10 AM Injection made incrementally with aspirations every 5 mL.  Performed by: Personally  Anesthesiologist: Rhenda Cedars, MD  Additional Notes: Functioning IV was confirmed and monitors were applied.  A 50mm 22ga Arrow echogenic stimulator needle was used. Sterile prep and drape,hand hygiene and sterile gloves were used. Ultrasound guidance: relevant anatomy identified, needle position confirmed, local anesthetic spread visualized around nerve(s)., vascular puncture avoided.  Image printed for medical record. Negative aspiration and negative test dose prior to incremental administration of local anesthetic. The patient tolerated the procedure well.

## 2023-08-17 NOTE — Anesthesia Postprocedure Evaluation (Signed)
 Anesthesia Post Note  Patient: [name] [name]  Procedure(s) Performed: REPAIR, TENDON, FLEXOR (Left: Foot) REPAIR, LIGAMENT (Left: Foot)     Patient location during evaluation: PACU Anesthesia Type: Regional and General Level of consciousness: awake and alert Pain management: pain level controlled Vital Signs Assessment: post-procedure vital signs reviewed and stable Respiratory status: spontaneous breathing, nonlabored ventilation, respiratory function stable and patient connected to nasal cannula oxygen Cardiovascular status: blood pressure returned to baseline and stable Postop Assessment: no apparent nausea or vomiting Anesthetic complications: no   No notable events documented.  Last Vitals:  Vitals:   08/17/23 0859 08/17/23 0908  BP: 106/63 107/71  Pulse: 78 81  Resp: 18 16  Temp:  (!) 36.3 C  SpO2: 94% 94%    Last Pain:  Vitals:   08/17/23 0908  TempSrc:   PainSc: 0-No pain                 Johann Gascoigne

## 2023-08-18 ENCOUNTER — Encounter (HOSPITAL_BASED_OUTPATIENT_CLINIC_OR_DEPARTMENT_OTHER): Payer: Self-pay | Admitting: Orthopedic Surgery

## 2023-08-31 DIAGNOSIS — M7672 Peroneal tendinitis, left leg: Secondary | ICD-10-CM | POA: Diagnosis not present

## 2023-08-31 DIAGNOSIS — M25372 Other instability, left ankle: Secondary | ICD-10-CM | POA: Diagnosis not present

## 2023-08-31 DIAGNOSIS — Z4889 Encounter for other specified surgical aftercare: Secondary | ICD-10-CM | POA: Diagnosis not present

## 2023-10-13 DIAGNOSIS — M25672 Stiffness of left ankle, not elsewhere classified: Secondary | ICD-10-CM | POA: Diagnosis not present

## 2023-10-13 DIAGNOSIS — M25372 Other instability, left ankle: Secondary | ICD-10-CM | POA: Diagnosis not present

## 2023-10-13 DIAGNOSIS — M25572 Pain in left ankle and joints of left foot: Secondary | ICD-10-CM | POA: Diagnosis not present

## 2023-10-17 DIAGNOSIS — M25372 Other instability, left ankle: Secondary | ICD-10-CM | POA: Diagnosis not present

## 2023-10-17 DIAGNOSIS — M25672 Stiffness of left ankle, not elsewhere classified: Secondary | ICD-10-CM | POA: Diagnosis not present

## 2023-10-17 DIAGNOSIS — M25572 Pain in left ankle and joints of left foot: Secondary | ICD-10-CM | POA: Diagnosis not present

## 2023-10-18 ENCOUNTER — Other Ambulatory Visit (HOSPITAL_BASED_OUTPATIENT_CLINIC_OR_DEPARTMENT_OTHER): Payer: Self-pay

## 2023-10-18 ENCOUNTER — Other Ambulatory Visit: Payer: Self-pay

## 2023-10-18 ENCOUNTER — Other Ambulatory Visit: Payer: Self-pay | Admitting: Sports Medicine

## 2023-10-18 MED ORDER — IRBESARTAN 150 MG PO TABS
150.0000 mg | ORAL_TABLET | Freq: Every day | ORAL | 3 refills | Status: AC
  Filled 2023-10-18: qty 90, 90d supply, fill #0
  Filled 2024-01-25: qty 90, 90d supply, fill #1
  Filled 2024-04-21: qty 90, 90d supply, fill #2

## 2023-10-24 DIAGNOSIS — M25372 Other instability, left ankle: Secondary | ICD-10-CM | POA: Diagnosis not present

## 2023-10-24 DIAGNOSIS — M25572 Pain in left ankle and joints of left foot: Secondary | ICD-10-CM | POA: Diagnosis not present

## 2023-10-24 DIAGNOSIS — M25672 Stiffness of left ankle, not elsewhere classified: Secondary | ICD-10-CM | POA: Diagnosis not present

## 2023-10-27 DIAGNOSIS — M25372 Other instability, left ankle: Secondary | ICD-10-CM | POA: Diagnosis not present

## 2023-10-27 DIAGNOSIS — M25672 Stiffness of left ankle, not elsewhere classified: Secondary | ICD-10-CM | POA: Diagnosis not present

## 2023-10-27 DIAGNOSIS — M25572 Pain in left ankle and joints of left foot: Secondary | ICD-10-CM | POA: Diagnosis not present

## 2023-10-30 DIAGNOSIS — M25672 Stiffness of left ankle, not elsewhere classified: Secondary | ICD-10-CM | POA: Diagnosis not present

## 2023-10-30 DIAGNOSIS — M25572 Pain in left ankle and joints of left foot: Secondary | ICD-10-CM | POA: Diagnosis not present

## 2023-10-30 DIAGNOSIS — M25372 Other instability, left ankle: Secondary | ICD-10-CM | POA: Diagnosis not present

## 2023-11-29 ENCOUNTER — Encounter: Payer: Self-pay | Admitting: Sports Medicine

## 2023-11-29 ENCOUNTER — Ambulatory Visit

## 2023-11-29 ENCOUNTER — Other Ambulatory Visit: Payer: Self-pay

## 2023-11-29 ENCOUNTER — Other Ambulatory Visit (HOSPITAL_BASED_OUTPATIENT_CLINIC_OR_DEPARTMENT_OTHER): Payer: Self-pay

## 2023-11-29 ENCOUNTER — Ambulatory Visit: Admitting: Sports Medicine

## 2023-11-29 VITALS — BP 125/79 | HR 93 | Temp 98.2°F | Resp 20 | Ht 61.0 in

## 2023-11-29 DIAGNOSIS — R053 Chronic cough: Secondary | ICD-10-CM | POA: Insufficient documentation

## 2023-11-29 DIAGNOSIS — R059 Cough, unspecified: Secondary | ICD-10-CM | POA: Diagnosis not present

## 2023-11-29 MED ORDER — PREDNISONE 50 MG PO TABS
50.0000 mg | ORAL_TABLET | Freq: Every day | ORAL | 0 refills | Status: DC
  Filled 2023-11-29: qty 5, 5d supply, fill #0

## 2023-11-29 MED ORDER — PANTOPRAZOLE SODIUM 40 MG PO TBEC
40.0000 mg | DELAYED_RELEASE_TABLET | Freq: Every day | ORAL | 3 refills | Status: DC
  Filled 2023-11-29: qty 30, 30d supply, fill #0

## 2023-11-29 MED ORDER — AZITHROMYCIN 250 MG PO TABS
ORAL_TABLET | ORAL | 0 refills | Status: AC
  Filled 2023-11-29: qty 6, 5d supply, fill #0

## 2023-11-29 MED ORDER — AIRSUPRA 90-80 MCG/ACT IN AERO
2.0000 | INHALATION_SPRAY | Freq: Four times a day (QID) | RESPIRATORY_TRACT | 11 refills | Status: AC | PRN
  Filled 2023-11-29: qty 10.7, 30d supply, fill #0

## 2023-11-29 NOTE — Assessment & Plan Note (Signed)
 6 weeks of cough, runny nose, no constitutional symptoms. Mild reflux symptoms. We discussed the anatomy and etiology as well as differential for chronic cough, we will cover all of the above, adding 5 days of steroids, azithromycin , chest x-ray, switching from albuterol  to Airsupra , adding pantoprazole , return to see me 4-6-week.

## 2023-11-29 NOTE — Progress Notes (Signed)
    Procedures performed today:    None.  Independent interpretation of notes and tests performed by another provider:   None.  Brief History, Exam, Impression, and Recommendations:    Chronic cough 6 weeks of cough, runny nose, no constitutional symptoms. Mild reflux symptoms. We discussed the anatomy and etiology as well as differential for chronic cough, we will cover all of the above, adding 5 days of steroids, azithromycin , chest x-ray, switching from albuterol  to Airsupra , adding pantoprazole , return to see me 4-6-week.    ____________________________________________ Debby PARAS. Curtis, M.D., ABFM., CAQSM., AME. Primary Care and Sports Medicine [facility]  Adjunct Professor of [facility] [employer] Medicine  [employer]  Restaurant manager, fast food

## 2023-12-05 ENCOUNTER — Ambulatory Visit: Payer: Self-pay | Admitting: Sports Medicine

## 2023-12-11 DIAGNOSIS — M25372 Other instability, left ankle: Secondary | ICD-10-CM | POA: Diagnosis not present

## 2023-12-27 ENCOUNTER — Ambulatory Visit: Admitting: Physician Assistant

## 2023-12-27 ENCOUNTER — Other Ambulatory Visit (HOSPITAL_BASED_OUTPATIENT_CLINIC_OR_DEPARTMENT_OTHER): Payer: Self-pay

## 2023-12-27 ENCOUNTER — Ambulatory Visit: Admitting: Sports Medicine

## 2023-12-27 VITALS — BP 140/70 | HR 78 | Ht 61.0 in | Wt 158.0 lb

## 2023-12-27 DIAGNOSIS — F40243 Fear of flying: Secondary | ICD-10-CM

## 2023-12-27 DIAGNOSIS — I10 Essential (primary) hypertension: Secondary | ICD-10-CM | POA: Diagnosis not present

## 2023-12-27 DIAGNOSIS — R053 Chronic cough: Secondary | ICD-10-CM

## 2023-12-27 DIAGNOSIS — Z91030 Bee allergy status: Secondary | ICD-10-CM | POA: Diagnosis not present

## 2023-12-27 MED ORDER — PANTOPRAZOLE SODIUM 40 MG PO TBEC
40.0000 mg | DELAYED_RELEASE_TABLET | Freq: Every day | ORAL | 3 refills | Status: AC
  Filled 2023-12-27: qty 90, 90d supply, fill #0
  Filled 2024-04-08: qty 90, 90d supply, fill #1

## 2023-12-27 MED ORDER — DIAZEPAM 5 MG PO TABS
5.0000 mg | ORAL_TABLET | Freq: Every day | ORAL | 0 refills | Status: AC
Start: 2023-12-27 — End: ?
  Filled 2023-12-27: qty 10, 10d supply, fill #0

## 2023-12-27 MED ORDER — EPINEPHRINE 0.3 MG/0.3ML IJ SOAJ
0.3000 mg | Freq: Once | INTRAMUSCULAR | 0 refills | Status: AC
  Filled 2023-12-27: qty 0.3, 1d supply, fill #0

## 2023-12-27 NOTE — Progress Notes (Unsigned)
 Established Patient Office Visit  Subjective   Patient ID: [name] [name], female    DOB: 1966/03/10  Age: 58 y.o. MRN: [id number]  Chief Complaint  Patient presents with   Cough    HPI Pt is a 58 yo female who presents to the clinic for medication refill and follow up on chronic cough.   Cough is better. She feels like start the protonix  is what helped the most. She wonders if she needs to continue. She denies any reflux symptoms or abdominal pain.   She would like epi pen to keep on hand for bee stings. She has had major swelling reactions when she is stung by bees.   She is checking her BP at home and work and running much better 130s over 70s. She denies any CP, palpitations, headaches or vision changes. She is compliant with medications.   She has a fear of flying and needs valium  refilled to use as needed. She is going on some trips in the fall.   ROS    Objective:     BP (!) 140/70   Pulse 78   Ht 5' 1 (1.549 m)   Wt 158 lb (71.7 kg)   LMP 11/09/2018   SpO2 99%   BMI 29.85 kg/m  BP Readings from Last 3 Encounters:  12/27/23 (!) 140/70  11/29/23 125/79  08/17/23 107/71   Wt Readings from Last 3 Encounters:  12/27/23 158 lb (71.7 kg)  08/17/23 159 lb 6.3 oz (72.3 kg)  12/20/22 153 lb (69.4 kg)      Physical Exam Constitutional:      Appearance: Normal appearance.  HENT:     Head: Normocephalic.  Cardiovascular:     Rate and Rhythm: Normal rate and regular rhythm.  Pulmonary:     Effort: Pulmonary effort is normal.  Abdominal:     General: There is no distension.     Palpations: Abdomen is soft. There is no mass.     Tenderness: There is no abdominal tenderness. There is no right CVA tenderness, left CVA tenderness, guarding or rebound.  Neurological:     General: No focal deficit present.     Mental Status: She is alert and oriented to person, place, and time.  Psychiatric:        Mood and Affect: Mood normal.       The 10-year ASCVD risk  score (Arnett DK, et al., 2019) is: 3.1%    Assessment & Plan:  SABRASABRACaliegh was seen today for cough.  Diagnoses and all orders for this visit:  Chronic cough -     pantoprazole  (PROTONIX ) 40 MG tablet; Take 1 tablet (40 mg total) by mouth daily.  Fear of flying -     diazepam  (VALIUM ) 5 MG tablet; Take 1 tablet (5 mg total) by mouth 2 hours before flying.  Essential hypertension  Bee sting allergy -     EPINEPHrine  0.3 mg/0.3 mL IJ SOAJ injection; Inject 0.3 mg into the muscle once for 1 dose.   Continue protonix  for now at least for another few months then consider decreasing to every other day and see what the lowest dose and still control of symptoms.   Epi pen to keep on hand.   Valium  to use as needed for phobias.   BP not quite to goal. Pt is on Avapro . Will continue to monitor with at home readings. Discussed low salt diet and regular exercise.  Goal BP is under 140/90. Follow up in 6 months  or as needed.    Return in about 6 months (around 06/28/2024).    Riyansh Gerstner, PA-C

## 2023-12-28 ENCOUNTER — Other Ambulatory Visit (HOSPITAL_BASED_OUTPATIENT_CLINIC_OR_DEPARTMENT_OTHER): Payer: Self-pay

## 2023-12-28 ENCOUNTER — Other Ambulatory Visit: Payer: Self-pay

## 2023-12-29 ENCOUNTER — Encounter: Payer: Self-pay | Admitting: Physician Assistant

## 2023-12-29 DIAGNOSIS — Z91030 Bee allergy status: Secondary | ICD-10-CM | POA: Insufficient documentation

## 2024-01-02 ENCOUNTER — Encounter: Payer: Self-pay | Admitting: Sports Medicine
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
## 2024-01-08 ENCOUNTER — Other Ambulatory Visit (HOSPITAL_BASED_OUTPATIENT_CLINIC_OR_DEPARTMENT_OTHER): Payer: Self-pay

## 2024-04-08 ENCOUNTER — Other Ambulatory Visit: Payer: Self-pay

## 2024-04-09 ENCOUNTER — Other Ambulatory Visit (HOSPITAL_BASED_OUTPATIENT_CLINIC_OR_DEPARTMENT_OTHER): Payer: Self-pay

## 2024-04-09 ENCOUNTER — Encounter (HOSPITAL_BASED_OUTPATIENT_CLINIC_OR_DEPARTMENT_OTHER): Payer: Self-pay

## 2024-04-09 ENCOUNTER — Other Ambulatory Visit: Payer: Self-pay

## 2024-04-19 ENCOUNTER — Other Ambulatory Visit: Payer: Self-pay

## 2024-04-22 ENCOUNTER — Other Ambulatory Visit (HOSPITAL_BASED_OUTPATIENT_CLINIC_OR_DEPARTMENT_OTHER): Payer: Self-pay

## 2024-04-22 ENCOUNTER — Other Ambulatory Visit: Payer: Self-pay

## 2024-04-22 ENCOUNTER — Telehealth: Admitting: Physician Assistant

## 2024-04-22 DIAGNOSIS — J019 Acute sinusitis, unspecified: Secondary | ICD-10-CM

## 2024-04-22 DIAGNOSIS — B9689 Other specified bacterial agents as the cause of diseases classified elsewhere: Secondary | ICD-10-CM | POA: Diagnosis not present

## 2024-04-22 MED ORDER — AMOXICILLIN-POT CLAVULANATE 875-125 MG PO TABS
1.0000 | ORAL_TABLET | Freq: Two times a day (BID) | ORAL | 0 refills | Status: AC
  Filled 2024-04-22: qty 14, 7d supply, fill #0

## 2024-04-22 NOTE — Progress Notes (Signed)

## 2024-04-23 ENCOUNTER — Encounter: Payer: Self-pay | Admitting: Physician Assistant

## 2024-04-23 ENCOUNTER — Other Ambulatory Visit (HOSPITAL_BASED_OUTPATIENT_CLINIC_OR_DEPARTMENT_OTHER): Payer: Self-pay

## 2024-04-23 DIAGNOSIS — F4321 Adjustment disorder with depressed mood: Secondary | ICD-10-CM

## 2024-04-26 ENCOUNTER — Encounter (HOSPITAL_BASED_OUTPATIENT_CLINIC_OR_DEPARTMENT_OTHER): Payer: Self-pay

## 2024-04-26 ENCOUNTER — Other Ambulatory Visit (HOSPITAL_BASED_OUTPATIENT_CLINIC_OR_DEPARTMENT_OTHER): Payer: Self-pay

## 2024-04-26 MED ORDER — QUETIAPINE FUMARATE 50 MG PO TABS
50.0000 mg | ORAL_TABLET | Freq: Every day | ORAL | 0 refills | Status: AC
  Filled 2024-04-26: qty 90, 90d supply, fill #0

## 2024-06-26 ENCOUNTER — Ambulatory Visit: Admitting: Physician Assistant
